# Patient Record
Sex: Female | Born: 1950
Health system: Southern US, Community
[De-identification: ages and names within clinical notes are randomized; demographics above are authoritative.]

## PROBLEM LIST (undated history)

## (undated) DIAGNOSIS — J189 Pneumonia, unspecified organism: Secondary | ICD-10-CM

## (undated) DIAGNOSIS — F32A Depression, unspecified: Secondary | ICD-10-CM

## (undated) DIAGNOSIS — I491 Atrial premature depolarization: Secondary | ICD-10-CM

## (undated) DIAGNOSIS — R413 Other amnesia: Secondary | ICD-10-CM

## (undated) DIAGNOSIS — Z8489 Family history of other specified conditions: Secondary | ICD-10-CM

## (undated) DIAGNOSIS — C801 Malignant (primary) neoplasm, unspecified: Secondary | ICD-10-CM

## (undated) DIAGNOSIS — K219 Gastro-esophageal reflux disease without esophagitis: Secondary | ICD-10-CM

## (undated) DIAGNOSIS — I499 Cardiac arrhythmia, unspecified: Secondary | ICD-10-CM

## (undated) DIAGNOSIS — K222 Esophageal obstruction: Secondary | ICD-10-CM

## (undated) DIAGNOSIS — M1990 Unspecified osteoarthritis, unspecified site: Secondary | ICD-10-CM

## (undated) DIAGNOSIS — K209 Esophagitis, unspecified without bleeding: Secondary | ICD-10-CM

## (undated) DIAGNOSIS — Z9109 Other allergy status, other than to drugs and biological substances: Secondary | ICD-10-CM

## (undated) DIAGNOSIS — S82892A Other fracture of left lower leg, initial encounter for closed fracture: Secondary | ICD-10-CM

## (undated) HISTORY — DX: Esophageal obstruction: K22.2

## (undated) HISTORY — PX: CORNEAL TRANSPLANT: SHX108

## (undated) HISTORY — DX: Esophagitis, unspecified: K20.9

## (undated) HISTORY — PX: KNEE ARTHROSCOPY W/ LATERAL RELEASE: SHX1873

## (undated) HISTORY — PX: EYE SURGERY: SHX253

## (undated) HISTORY — PX: INCONTINENCE SURGERY: SHX676

## (undated) HISTORY — DX: Depression, unspecified: F32.A

## (undated) HISTORY — DX: Esophagitis, unspecified without bleeding: K20.90

## (undated) HISTORY — PX: KNEE ARTHROSCOPY W/ MENISCAL REPAIR: SHX1877

## (undated) HISTORY — PX: COLONOSCOPY: SHX174

---

## 1996-12-31 HISTORY — PX: ABDOMINAL HYSTERECTOMY: SHX81

## 1998-12-31 HISTORY — PX: ANTERIOR FUSION CERVICAL SPINE: SUR626

## 2000-01-25 ENCOUNTER — Other Ambulatory Visit: Admission: RE | Admit: 2000-01-25 | Discharge: 2000-01-25 | Payer: Self-pay | Admitting: Obstetrics and Gynecology

## 2000-02-08 ENCOUNTER — Encounter: Payer: Self-pay | Admitting: Orthopedic Surgery

## 2000-02-08 ENCOUNTER — Ambulatory Visit (HOSPITAL_COMMUNITY): Admission: RE | Admit: 2000-02-08 | Discharge: 2000-02-08 | Payer: Self-pay | Admitting: Orthopedic Surgery

## 2000-03-29 ENCOUNTER — Encounter: Payer: Self-pay | Admitting: Neurological Surgery

## 2000-04-02 ENCOUNTER — Encounter: Payer: Self-pay | Admitting: Neurological Surgery

## 2000-04-02 ENCOUNTER — Inpatient Hospital Stay (HOSPITAL_COMMUNITY): Admission: RE | Admit: 2000-04-02 | Discharge: 2000-04-03 | Payer: Self-pay | Admitting: Neurological Surgery

## 2000-04-26 ENCOUNTER — Encounter: Payer: Self-pay | Admitting: Neurological Surgery

## 2000-04-26 ENCOUNTER — Encounter: Admission: RE | Admit: 2000-04-26 | Discharge: 2000-04-26 | Payer: Self-pay | Admitting: Neurological Surgery

## 2000-05-24 ENCOUNTER — Encounter: Payer: Self-pay | Admitting: Neurological Surgery

## 2000-05-24 ENCOUNTER — Encounter: Admission: RE | Admit: 2000-05-24 | Discharge: 2000-05-24 | Payer: Self-pay | Admitting: Neurological Surgery

## 2001-06-11 ENCOUNTER — Emergency Department (HOSPITAL_COMMUNITY): Admission: EM | Admit: 2001-06-11 | Discharge: 2001-06-11 | Payer: Self-pay | Admitting: Internal Medicine

## 2001-08-08 ENCOUNTER — Other Ambulatory Visit: Admission: RE | Admit: 2001-08-08 | Discharge: 2001-08-08 | Payer: Self-pay | Admitting: Obstetrics and Gynecology

## 2002-09-17 ENCOUNTER — Other Ambulatory Visit: Admission: RE | Admit: 2002-09-17 | Discharge: 2002-09-17 | Payer: Self-pay | Admitting: Obstetrics and Gynecology

## 2003-07-14 ENCOUNTER — Encounter: Payer: Self-pay | Admitting: *Deleted

## 2003-07-14 ENCOUNTER — Emergency Department (HOSPITAL_COMMUNITY): Admission: EM | Admit: 2003-07-14 | Discharge: 2003-07-14 | Payer: Self-pay | Admitting: Emergency Medicine

## 2003-08-04 ENCOUNTER — Other Ambulatory Visit: Admission: RE | Admit: 2003-08-04 | Discharge: 2003-08-04 | Payer: Self-pay | Admitting: Obstetrics and Gynecology

## 2004-01-01 DIAGNOSIS — S82892A Other fracture of left lower leg, initial encounter for closed fracture: Secondary | ICD-10-CM

## 2004-01-01 HISTORY — DX: Other fracture of left lower leg, initial encounter for closed fracture: S82.892A

## 2005-03-09 ENCOUNTER — Ambulatory Visit: Payer: Self-pay | Admitting: Internal Medicine

## 2005-06-22 ENCOUNTER — Ambulatory Visit: Payer: Self-pay | Admitting: Internal Medicine

## 2005-07-29 ENCOUNTER — Inpatient Hospital Stay (HOSPITAL_COMMUNITY): Admission: EM | Admit: 2005-07-29 | Discharge: 2005-07-30 | Payer: Self-pay | Admitting: Emergency Medicine

## 2005-11-05 ENCOUNTER — Ambulatory Visit: Payer: Self-pay | Admitting: Internal Medicine

## 2006-01-14 ENCOUNTER — Encounter (INDEPENDENT_AMBULATORY_CARE_PROVIDER_SITE_OTHER): Payer: Self-pay | Admitting: Specialist

## 2006-01-14 ENCOUNTER — Ambulatory Visit: Payer: Self-pay | Admitting: Internal Medicine

## 2006-02-18 ENCOUNTER — Other Ambulatory Visit: Admission: RE | Admit: 2006-02-18 | Discharge: 2006-02-18 | Payer: Self-pay | Admitting: Obstetrics and Gynecology

## 2006-11-11 ENCOUNTER — Encounter: Admission: RE | Admit: 2006-11-11 | Discharge: 2006-11-11 | Payer: Self-pay | Admitting: Neurological Surgery

## 2007-08-31 ENCOUNTER — Encounter: Admission: RE | Admit: 2007-08-31 | Discharge: 2007-08-31 | Payer: Self-pay | Admitting: Neurological Surgery

## 2007-09-18 ENCOUNTER — Encounter: Admission: RE | Admit: 2007-09-18 | Discharge: 2007-09-18 | Payer: Self-pay | Admitting: Neurological Surgery

## 2007-09-26 ENCOUNTER — Encounter: Admission: RE | Admit: 2007-09-26 | Discharge: 2007-09-26 | Payer: Self-pay | Admitting: Neurological Surgery

## 2007-10-29 ENCOUNTER — Ambulatory Visit: Payer: Self-pay | Admitting: Internal Medicine

## 2008-02-24 DIAGNOSIS — Z8719 Personal history of other diseases of the digestive system: Secondary | ICD-10-CM

## 2008-02-24 DIAGNOSIS — Z85828 Personal history of other malignant neoplasm of skin: Secondary | ICD-10-CM

## 2008-02-24 DIAGNOSIS — E8941 Symptomatic postprocedural ovarian failure: Secondary | ICD-10-CM

## 2008-03-15 ENCOUNTER — Encounter: Payer: Self-pay | Admitting: Internal Medicine

## 2008-04-25 ENCOUNTER — Encounter: Admission: RE | Admit: 2008-04-25 | Discharge: 2008-04-25 | Payer: Self-pay | Admitting: Neurological Surgery

## 2008-06-08 ENCOUNTER — Emergency Department (HOSPITAL_BASED_OUTPATIENT_CLINIC_OR_DEPARTMENT_OTHER): Admission: EM | Admit: 2008-06-08 | Discharge: 2008-06-08 | Payer: Self-pay | Admitting: Emergency Medicine

## 2008-11-23 ENCOUNTER — Encounter: Payer: Self-pay | Admitting: Internal Medicine

## 2009-01-07 ENCOUNTER — Telehealth: Payer: Self-pay | Admitting: Internal Medicine

## 2009-02-16 DIAGNOSIS — K222 Esophageal obstruction: Secondary | ICD-10-CM

## 2009-02-16 DIAGNOSIS — K219 Gastro-esophageal reflux disease without esophagitis: Secondary | ICD-10-CM

## 2009-02-18 ENCOUNTER — Ambulatory Visit: Payer: Self-pay | Admitting: Internal Medicine

## 2009-02-18 DIAGNOSIS — R195 Other fecal abnormalities: Secondary | ICD-10-CM | POA: Insufficient documentation

## 2009-02-21 LAB — CONVERTED CEMR LAB
Basophils Absolute: 0 10*3/uL (ref 0.0–0.1)
Basophils Relative: 0.1 % (ref 0.0–3.0)
Eosinophils Relative: 5.4 % — ABNORMAL HIGH (ref 0.0–5.0)
HCT: 39.9 % (ref 36.0–46.0)
Hemoglobin: 13.6 g/dL (ref 12.0–15.0)
Iron: 61 ug/dL (ref 42–145)
Lymphocytes Relative: 40.6 % (ref 12.0–46.0)
MCV: 92.4 fL (ref 78.0–100.0)
Monocytes Relative: 6.8 % (ref 3.0–12.0)
Neutrophils Relative %: 47.1 % (ref 43.0–77.0)
RBC: 4.32 M/uL (ref 3.87–5.11)
Saturation Ratios: 19.2 % — ABNORMAL LOW (ref 20.0–50.0)
Transferrin: 226.6 mg/dL (ref >=212.0)

## 2009-02-25 ENCOUNTER — Ambulatory Visit: Payer: Self-pay | Admitting: Internal Medicine

## 2009-02-28 LAB — CONVERTED CEMR LAB
OCCULT 1: NEGATIVE
OCCULT 3: NEGATIVE
OCCULT 4: NEGATIVE

## 2010-01-12 ENCOUNTER — Encounter: Payer: Self-pay | Admitting: Internal Medicine

## 2010-02-23 ENCOUNTER — Ambulatory Visit (HOSPITAL_COMMUNITY): Admission: RE | Admit: 2010-02-23 | Discharge: 2010-02-23 | Payer: Self-pay | Admitting: Obstetrics and Gynecology

## 2010-08-07 ENCOUNTER — Telehealth: Payer: Self-pay | Admitting: Internal Medicine

## 2010-09-20 ENCOUNTER — Ambulatory Visit: Payer: Self-pay | Admitting: Internal Medicine

## 2010-11-16 ENCOUNTER — Encounter: Payer: Self-pay | Admitting: Internal Medicine

## 2010-11-16 ENCOUNTER — Telehealth: Payer: Self-pay | Admitting: Internal Medicine

## 2010-12-14 ENCOUNTER — Encounter (INDEPENDENT_AMBULATORY_CARE_PROVIDER_SITE_OTHER): Payer: Self-pay | Admitting: *Deleted

## 2010-12-18 ENCOUNTER — Ambulatory Visit: Payer: Self-pay | Admitting: Internal Medicine

## 2011-01-03 ENCOUNTER — Encounter: Payer: Self-pay | Admitting: Internal Medicine

## 2011-01-03 ENCOUNTER — Ambulatory Visit
Admission: RE | Admit: 2011-01-03 | Discharge: 2011-01-03 | Payer: Self-pay | Source: Home / Self Care | Attending: Internal Medicine | Admitting: Internal Medicine

## 2011-01-04 ENCOUNTER — Encounter: Payer: Self-pay | Admitting: Internal Medicine

## 2011-01-30 NOTE — Miscellaneous (Signed)
Summary: Nexium Refill  Clinical Lists Changes  Medications: Changed medication from NEXIUM 40 MG CPDR (ESOMEPRAZOLE MAGNESIUM) Take 1 tablet by mouth once a day. to NEXIUM 40 MG CPDR (ESOMEPRAZOLE MAGNESIUM) Take 1 tablet by mouth once a day. - Signed Rx of NEXIUM 40 MG CPDR (ESOMEPRAZOLE MAGNESIUM) Take 1 tablet by mouth once a day.;  #30 x 5;  Signed;  Entered by: Hortense Ramal CMA (AAMA);  Authorized by: Hart Carwin MD;  Method used: Electronically to [facility] [address], [place], [place], [place]  [address], Ph: [phone] or [phone], Fax: [phone]    Prescriptions: NEXIUM 40 MG CPDR (ESOMEPRAZOLE MAGNESIUM) Take 1 tablet by mouth once a day.  #30 x 5   Entered by:   Hortense Ramal CMA (AAMA)   Authorized by:   Hart Carwin MD   Signed by:   Hortense Ramal CMA (AAMA) on 01/12/2010   Method used:   Electronically to        [facility] [address] (retail)       [address]       [place], [place]  [address]       Ph: [phone] or [phone]       Fax: [phone]   RxID:   [id number]

## 2011-01-30 NOTE — Letter (Signed)
Summary: Pre Visit Letter Revised  [facility]  [address] [place], [place] [address]   Phone: [phone]  Fax: [phone]        11/16/2010 MRN: [id number] [name] Stone [address] [place], [place]  [address]             Procedure Date:  01/03/11   Welcome to the Gastroenterology Division at [facility].    You are scheduled to see a nurse for your pre-procedure visit on Monday 12/18/10 at 10:30 am on the 3rd floor at [facility], [address]  We ask that you try to arrive at our office 15 minutes prior to your appointment time to allow for check-in.  Please take a minute to review the attached form.  If you answer "Yes" to one or more of the questions on the first page, we ask that you call the person listed at your earliest opportunity.  If you answer "No" to all of the questions, please complete the rest of the form and bring it to your appointment.    Your nurse visit will consist of discussing your medical and surgical history, your immediate family medical history, and your medications.   If you are unable to list all of your medications on the form, please bring the medication bottles to your appointment and we will list them.  We will need to be aware of both prescribed and over the counter drugs.  We will need to know exact dosage information as well.    Please be prepared to read and sign documents such as consent forms, a financial agreement, and acknowledgement forms.  If necessary, and with your consent, a friend or relative is welcome to sit-in on the nurse visit with you.  Please bring your insurance card so that we may make a copy of it.  If your insurance requires a referral to see a specialist, please bring your referral form from your primary care physician.  No co-pay is required for this nurse visit.     If you cannot keep your appointment, please call [phone] to cancel or reschedule prior to your appointment date.  This  allows [place] the opportunity to schedule an appointment for another patient in need of care.    Thank you for choosing [facility] for your medical needs.  We appreciate the opportunity to care for you.  Please visit [place] at our website  to learn more about our practice.  Sincerely, The Gastroenterology Division

## 2011-01-30 NOTE — Assessment & Plan Note (Signed)
Summary: refill on meds--ch.    History of Present Illness Visit Type: Follow-up Visit Primary GI MD: [name] MD Primary Provider: [facility] Requesting Provider: n/a Chief Complaint: Pt needs refills on Nexium and to check to see when her next colonoscopy is due History of Present Illness:   This is a 60 year old white female with chronic gastroesophageal reflux and a history of esophageal stricture for which she is status post dilatation in January 2007. She is asymptomatic on Nexium 40 mg a day. Her insurance has been hesitant to pay for her prescription. They allowed her only 2 refills over next 6 months. She has a new family history of colon polyps. Her bowel habits have been regular. The last colonoscopy in June 2006 was normal. 3 Hemoccult cards in February 2010 were negative. She is interested in having her colonoscopy done earlier because of the new family history of colon polyps and change in her bowel habits.   GI Review of Systems      Denies abdominal pain, acid reflux, belching, bloating, chest pain, dysphagia with liquids, dysphagia with solids, heartburn, loss of appetite, nausea, vomiting, vomiting blood, weight loss, and  weight gain.        Denies anal fissure, black tarry stools, change in bowel habit, constipation, diarrhea, diverticulosis, fecal incontinence, heme positive stool, hemorrhoids, irritable bowel syndrome, jaundice, light color stool, liver problems, rectal bleeding, and  rectal pain.    Current Medications (verified): 1)  Nexium 40 Mg Cpdr (Esomeprazole Magnesium) .... Take 1 Tablet By Mouth Once A Day. 2)  Estrace 1 Mg Tabs (Estradiol) .... One Tablet By Mouth Once Daily 3)  Bentyl 10 Mg Caps (Dicyclomine Hcl) .... Take 1 Tablet By Mouth Two Times A Day As Needed For Crampy Abdominal Pain 4)  Cytomel 25 Mcg Tabs (Liothyronine Sodium) .Marland Kitchen.. 1 By Mouth Once Daily  Allergies (verified): 1)  ! Demerol  Past History:  Past  Medical History: Last updated: 02/16/2009 Current Problems:  Hx of ESOPHAGEAL STRICTURE (ICD-530.3) GERD (ICD-530.81) CONSTIPATION, HX OF (ICD-V12.79) SKIN CANCER, HX OF (ICD-V10.83) MENOPAUSE, SURGICAL (ICD-627.4) ESOPHAGITIS, HX OF (ICD-V12.79)  Past Surgical History: Hysterectomy Spinal fusion C3,4,5 Arthroscopic surgery on both knees Eye surgery bladder sling 2011  Family History: Reviewed history from 02/18/2009 and no changes required. No FH of Colon Cancer: Skin cancer: Father, Grandafther, Brother Family History of Colon Polyps: Brother Family History of Colitis/Crohn's: Mother  Social History: Reviewed history from 02/18/2009 and no changes required. Alcohol Use - no Illicit Drug Use - no Patient has never smoked.  Daily Caffeine Use  Review of Systems       Pertinent positive and negative review of systems were noted in the above HPI. All other ROS was otherwise negative.   Vital Signs:  Patient profile:   60 year old female Height:      66 inches Weight:      150 pounds BMI:     24.30 BSA:     1.77 Pulse rate:   80 / minute BP sitting:   90 / 60  (left arm)  Vitals Entered By: Merri Ray CMA Duncan Dull) (September 20, 2010 8:59 AM)   Impression & Recommendations:  Problem # 1:  Hx of ESOPHAGEAL STRICTURE (ICD-530.3) Patient is asymptomatic on Nexium 40 mg daily. We will attempt prior authorization so she can have refills for one year. If not, we will switch her to Prilosec 40 mg daily.  Problem # 2:  CONSTIPATION, HX OF (ICD-V12.79) Patient has  had a change in bowel habits. We will plan on doing a colonoscopy before the end of the year. She has a new family history of colon polyps.  Patient Instructions: 1)  high-fiber diet. 2)  Nexium 40 mg daily. We will try to get prior authorization. If this is unsuccessful, we will try her on Prilosec 40 mg once daily.  3)  Antireflux measures. 4)  Colonoscopy in December 2011. 5)  The medication list was  reviewed and reconciled.  All changed / newly prescribed medications were explained.  A complete medication list was provided to the patient / caregiver. Prescriptions: NEXIUM 40 MG CPDR (ESOMEPRAZOLE MAGNESIUM) Take 1 tablet by mouth once a day.  #30 x 3   Entered by:   Lamona Curl CMA (AAMA)   Authorized by:   Hart Carwin MD   Signed by:   Lamona Curl CMA (AAMA) on 09/20/2010   Method used:   Electronically to        [facility] [address] (retail)       [address]       [place], [place]  [address]       Ph: [phone] or [phone]       Fax: [phone]   RxID:   [id number]

## 2011-01-30 NOTE — Progress Notes (Signed)
Summary: Medication   Phone Note Call from Patient Call back at Home Phone [phone]   Caller: Patient Call For: Dr. Juanda Chance Reason for Call: Refill Medication Summary of Call: Needs refill on her Nexium...sch'd appt for 09-20-10.Marland KitchenMarland KitchenMarland KitchenSharl [facility] Initial call taken by: Karna Christmas,  August 07, 2010 9:58 AM  Follow-up for Phone Call        Prescription sent. Follow-up by: Lamona Curl CMA (AAMA),  August 07, 2010 10:20 AM    Prescriptions: NEXIUM 40 MG CPDR (ESOMEPRAZOLE MAGNESIUM) Take 1 tablet by mouth once a day.  #30 x 1   Entered by:   Lamona Curl CMA (AAMA)   Authorized by:   Hart Carwin MD   Signed by:   Lamona Curl CMA (AAMA) on 08/07/2010   Method used:   Electronically to        [facility] [address] (retail)       [address]       [place], [place]  [address]       Ph: [phone] or [phone]       Fax: [phone]   RxID:   [id number]

## 2011-01-30 NOTE — Progress Notes (Signed)
Summary: procedure?   Phone Note Call from Patient Call back at [phone]   Caller: Patient Call For: Dr. Juanda Chance Reason for Call: Talk to Nurse Summary of Call: thinks she is supposed to speak with Dottie regarding a procedure but isnt sure Initial call taken by: Vallarie Mare,  November 16, 2010 1:15 PM  Follow-up for Phone Call        Patient calling to set up colonoscopy (as recommended per Dr Juanda Chance at patients last office visit). Patient has been set up for previsit and colonoscopy. Follow-up by: Lamona Curl CMA Duncan Dull),  November 16, 2010 3:21 PM

## 2011-02-01 NOTE — Letter (Signed)
Summary: Patient Notice- Colon Biospy Results  [facility]  [address] [place], [place] [address]   Phone: [phone]  Fax: [phone]        January 04, 2011 MRN: [id number]    [name] Stone [address] [place], [place]  [address]    Dear Ms. Verlon Setting,  I am pleased to inform you that the biopsies taken during your recent colonoscopy did not show any evidence of cancer upon pathologic examination.The polyp consisted of normal colon tissue, no precancerous tissue.  Additional information/recommendations:  _x_No further action is needed at this time.  Please follow-up with      your primary care physician for your other healthcare needs.  __Please call [phone] to schedule a return visit to review      your condition.  __Continue with the treatment plan as outlined on the day of your      exam.  x__You should have a repeat colonoscopy examination for this problem           in 10 _ years.  Please call us if you are having persistent problems or have questions about your condition that have not been fully answered at this time.  Sincerely,  Hart Carwin MD   This letter has been electronically signed by your physician.  Appended Document: Patient Notice- Colon Biospy Results Letter mailed

## 2011-02-01 NOTE — Miscellaneous (Signed)
Summary: [facility] PV  Clinical Lists Changes  Medications: Added new medication of MIRALAX   POWD (POLYETHYLENE GLYCOL 3350) As per prep  instructions. - Signed Added new medication of DULCOLAX 5 MG  TBEC (BISACODYL) Day before procedure take 2 at 3pm and 2 at 8pm. - Signed Added new medication of REGLAN 10 MG  TABS (METOCLOPRAMIDE HCL) As per prep instructions. - Signed Rx of MIRALAX   POWD (POLYETHYLENE GLYCOL 3350) As per prep  instructions.;  #255gm x 0;  Signed;  Entered by: Ezra Sites RN;  Authorized by: Hart Carwin MD;  Method used: Electronically to [facility] [address], [place], [place], [place]  [address], Ph: [phone] or [phone], Fax: [phone] Rx of DULCOLAX 5 MG  TBEC (BISACODYL) Day before procedure take 2 at 3pm and 2 at 8pm.;  #4 x 0;  Signed;  Entered by: Ezra Sites RN;  Authorized by: Hart Carwin MD;  Method used: Electronically to [facility] [address], [place], [place], [place]  [address], Ph: [phone] or [phone], Fax: [phone] Rx of REGLAN 10 MG  TABS (METOCLOPRAMIDE HCL) As per prep instructions.;  #2 x 0;  Signed;  Entered by: Ezra Sites RN;  Authorized by: Hart Carwin MD;  Method used: Electronically to [facility] [address] [place], [place], [place]  [address], Ph: [phone] or [phone], Fax: [phone] Allergies: Changed allergy or adverse reaction from DEMEROL to DEMEROL Added new allergy or adverse reaction of ASA    Prescriptions: REGLAN 10 MG  TABS (METOCLOPRAMIDE HCL) As per prep instructions.  #2 x 0   Entered by:   Ezra Sites RN   Authorized by:   Hart Carwin MD   Signed by:   Ezra Sites RN on 12/18/2010   Method used:   Electronically to        [facility] [address] (retail)       [address]       [place], [place]  [address]       Ph: [phone] or [phone]       Fax: [phone]   RxID:    [id number] DULCOLAX 5 MG  TBEC (BISACODYL) Day before procedure take 2 at 3pm and 2 at 8pm.  #4 x 0   Entered by:   Ezra Sites RN   Authorized by:   Hart Carwin MD   Signed by:   Ezra Sites RN on 12/18/2010   Method used:   Electronically to        [facility] [address] (retail)       [address]       [place], [place]  [address]       Ph: [phone] or [phone]       Fax: [phone]   RxID:   [id number] MIRALAX   POWD (POLYETHYLENE GLYCOL 3350) As per prep  instructions.  #255gm x 0   Entered by:   Ezra Sites RN   Authorized by:   Hart Carwin MD   Signed by:   Ezra Sites RN on 12/18/2010   Method used:   Electronically to        [facility] [address] (retail)       [address]       [address]       [place], [place]  [address]       Ph: [phone] or [phone]       Fax: [phone]   RxID:   [id number]

## 2011-02-01 NOTE — Letter (Signed)
Summary: Miralax Instructions  [facility]  [address]   [place], [place] [address]   Phone: [phone]  Fax: [phone]       BELITA WARSAME    01-01-1951    MRN: [id number]       Procedure Day /Date: Wednessday, 01-03-11     Arrival Time: 10:30 a.m.     Procedure Time: 11:30 a.m.     Location of Procedure:                    _x  [facility] (4th Floor)  PREPARATION FOR COLONOSCOPY WITH MIRALAX  Starting 5 days prior to your procedure 12-29-10  do not eat nuts, seeds, popcorn, corn, beans, peas,  salads, or any raw vegetables.  Do not take any fiber supplements (e.g. Metamucil, Citrucel, and Benefiber). ____________________________________________________________________________________________________   THE DAY BEFORE YOUR PROCEDURE         DATE: 01-02-11  DAY: Tuesday  1   Drink clear liquids the entire day-NO SOLID FOOD  2   Do not drink anything colored red or purple.  Avoid juices with pulp.  No orange juice.  3   Drink at least 64 oz. (8 glasses) of fluid/clear liquids during the day to prevent dehydration and help the prep work efficiently.  CLEAR LIQUIDS INCLUDE: Water Jello Ice Popsicles Tea (sugar ok, no milk/cream) Powdered fruit flavored drinks Coffee (sugar ok, no milk/cream) Gatorade Juice: apple, white grape, white cranberry  Lemonade Clear bullion, consomm, broth Carbonated beverages (any kind) Strained chicken noodle soup Hard Candy  4   Mix the entire bottle of Miralax with 64 oz. of Gatorade/Powerade in the morning and put in the refrigerator to chill.  5   At 3:00 pm take 2 Dulcolax/Bisacodyl tablets.  6   At 4:30 pm take one Reglan/Metoclopramide tablet.  7  Starting at 5:00 pm drink one 8 oz glass of the Miralax mixture every 15-20 minutes until you have finished drinking the entire 64 oz.  You should finish drinking prep around 7:30 or 8:00 pm.  8   If you are nauseated, you may take the 2nd  Reglan/Metoclopramide tablet at 6:30 pm.        9    At 8:00 pm take 2 more DULCOLAX/Bisacodyl tablets.     THE DAY OF YOUR PROCEDURE      DATE:  01-03-11   DAY: Wednesday  You may drink clear liquids until 9:30 a.m.  (2 HOURS BEFORE PROCEDURE).   MEDICATION INSTRUCTIONS  Unless otherwise instructed, you should take regular prescription medications with a small sip of water as early as possible the morning of your procedure.           OTHER INSTRUCTIONS  You will need a responsible adult at least 58 years of age to accompany you and drive you home.   This person must remain in the waiting room during your procedure.  Wear loose fitting clothing that is easily removed.  Leave jewelry and other valuables at home.  However, you may wish to bring a book to read or an iPod/MP3 player to listen to music as you wait for your procedure to start.  Remove all body piercing jewelry and leave at home.  Total time from sign-in until discharge is approximately 2-3 hours.  You should go home directly after your procedure and rest.  You can resume normal activities the day after your procedure.  The day of your procedure you should not:   Drive  Make legal decisions   Operate machinery   Drink alcohol   Return to work  You will receive specific instructions about eating, activities and medications before you leave.   The above instructions have been reviewed and explained to me by   Ezra Sites RN  December 18, 2010 11:14 AM     I fully understand and can verbalize these instructions _____________________________ Date _______

## 2011-02-01 NOTE — Procedures (Signed)
Summary: Colonoscopy  Patient: [name] Stone Note: All result statuses are Final unless otherwise noted.  Tests: (1) Colonoscopy (COL)   COL Colonoscopy           DONE     [facility]     [address]     [place], [place]  [address]           COLONOSCOPY PROCEDURE REPORT           PATIENT:  [name] Stone, [name] Stone  MR#:  [id number]     BIRTHDATE:  August 09, 1951, 59 yrs. old  GENDER:  female     ENDOSCOPIST:  Hedwig Morton. Juanda Chance, MD     REF. BY:  Doran Clay, M.D.     PROCEDURE DATE:  01/03/2011     PROCEDURE:  Colonoscopy 08657     ASA CLASS:  Class I     INDICATIONS:  family Hx of polyps last colon 2006,     change in bowl habits     MEDICATIONS:   Versed 10 mg, Fentanyl 100 mcg           DESCRIPTION OF PROCEDURE:   After the risks benefits and     alternatives of the procedure were thoroughly explained, informed     consent was obtained.  Digital rectal exam was performed and     revealed no rectal masses.   The LB [id number] endoscope     was introduced through the anus and advanced to the cecum, which     was identified by both the appendix and ileocecal valve, without     limitations.  The quality of the prep was good, using MiraLax.     The instrument was then slowly withdrawn as the colon was fully     examined.     <<PROCEDUREIMAGES>>           FINDINGS:  A diminutive polyp was found in the sigmoid colon. at     15 cm 2 mm polyp The polyp was removed using cold biopsy forceps     (see image3 and image4).  This was otherwise a normal examination     of the colon (see image5, image2, and image1).   Retroflexed views     in the rectum revealed no abnormalities.    The scope was then     withdrawn from the patient and the procedure completed.           COMPLICATIONS:  None     ENDOSCOPIC IMPRESSION:     1) Diminutive polyp in the sigmoid colon     2) Otherwise normal examination     RECOMMENDATIONS:     1) Await pathology results     REPEAT EXAM:  In 7 year(s)  for.           ______________________________     Hedwig Morton. Juanda Chance, MD           CC:           n.     eSIGNED:   Hedwig Morton. Rillie Riffel at 01/03/2011 12:35 PM           Ruchy, [place], [id number]  Note: An exclamation mark (!) indicates a result that was not dispersed into the flowsheet. Document Creation Date: 01/03/2011 12:35 PM _______________________________________________________________________  (1) Order result status: Final Collection or observation date-time: 01/03/2011 12:29 Requested date-time:  Receipt date-time:  Reported date-time:  Referring Physician:   Ordering Physician: [name] [phone]) Specimen Source:  Source: Kem Parkinson  Filler Order Number: [id number] Lab site:   Appended Document: Colonoscopy     Procedures Next Due Date:    Colonoscopy: 12/2017  Appended Document: Colonoscopy     Procedures Next Due Date:    Colonoscopy: 12/2020

## 2011-03-22 LAB — URINALYSIS, ROUTINE W REFLEX MICROSCOPIC
Ketones, ur: NEGATIVE mg/dL
Specific Gravity, Urine: <1.005 — ABNORMAL LOW (ref 1.005–1.030)

## 2011-03-22 LAB — CBC
HCT: 42.9 % (ref 36.0–46.0)
Hemoglobin: 14.2 g/dL (ref 12.0–15.0)
MCV: 93.2 fL (ref 78.0–100.0)
RBC: 4.61 MIL/uL (ref 3.87–5.11)

## 2011-05-15 NOTE — Assessment & Plan Note (Signed)
[facility]                         GASTROENTEROLOGY OFFICE NOTE   NAME:[name], [name] ARIZMENDI                       MRN:          [id number]  DATE:10/29/2007                            DOB:          28-Oct-1951    [name] [name] is a 60 year old white female who has significant  gastroesophageal reflux disease and has been dependent on Nexium 40 mg  daily.  She has switched to another medication and started taking over-  the-counter Prilosec.  This gave only partial control of her reflux  symptoms.  She had 1 episode recently of food regurgitation at night.  On last upper endoscopy in January of 2007, she was found to have a mild  nonobstructing esophageal stricture.  She did not have Barrett's  esophagus.  Her H pylori test was negative.  She also had a screening  colonoscopy in June of 2006 which was essentially normal.  No polyps.  Her repeat colonoscopy would be in 10 years, that it is in June of 2016.   MEDICATIONS:  1. Estradiol daily.  2. Nexium 40 mg p.o. daily.  3. Aspirin 81 mg p.o. daily.   PHYSICAL EXAMINATION:  VITAL SIGNS:  Blood pressure 120/80, pulse 82,  weight 163 pounds.   The patient was not examined.   IMPRESSION:  68. 60 year old white female with gastroesophageal reflux disease      documented on upper endoscopy.  Biopsies of the esophagus showed      mild inflammation, no intestinal metaplasia.  She is dependent on      proton pump inhibitors.  2. Colorectal screening completed in 2006.   PLAN:  1. Refill for Nexium for 1 year.  The patient may cut back on her      Nexium to every other day and to titrate the dose again for      symptoms.  2. Next colonoscopy recall in 2016.     Hedwig Morton. Juanda Chance, MD  Electronically Signed    DMB/MedQ  DD: 10/29/2007  DT: 10/30/2007  Job #: [id number]   cc:   Al Decant. Janey Greaser, MD

## 2011-05-18 NOTE — H&P (Signed)
NAMEMarland Kitchen  [name], Stone NO.:  [id number]   MEDICAL RECORD NO.:  [id number]          PATIENT TYPE:  EMS   LOCATION:  MAJO                         FACILITY:  [facility]   PHYSICIAN:  Peter M. Swaziland, M.D.  DATE OF BIRTH:  1951/09/04   DATE OF ADMISSION:  07/29/2005  DATE OF DISCHARGE:                                HISTORY & PHYSICAL   HISTORY OF PRESENT ILLNESS:  Ms.  [name] Stone is a 60 year old white female  previous healthy, appearing well in church this morning.  She complains of  severe chest pain in the left precordial region.  She grades this as 8 to  9/10.  It was not radiating but was associated with some shortness of  breath.  Her pain seemed to be worse when she took a deep breath.  She  noticed her hands sweating.  The intense pain lasted 15 minutes and then  seemed to abate.  She felt extremely wiped out afterwards.  Of note, the  patient had a cardiac evaluation two years ago including stress Cardiolite  study and echocardiogram which were unremarkable.  She does report she has  had recent colonoscopy but has never had upper endoscopy or evaluation for  gallstones.   PAST MEDICAL HISTORY:  1.  Status post cervical laminectomy.  2.  Status post hysterectomy.  3.  Status post bilateral knee surgery.   The patient has no history of hypertension, hyperlipidemia, or diabetes.   MEDICATIONS:  Estradiol daily.   ALLERGIES:  DEMEROL.   SOCIAL HISTORY:  The patient is a principal at an elementary school.  She  denies tobacco or alcohol use.   FAMILY HISTORY:  Negative for coronary disease.   PHYSICAL EXAMINATION:  GENERAL:  The patient is a pleasant white female in  no apparent distress.  VITAL SIGNS:  Blood pressure 134/78, pulse 67 and regular, respirations 20.  She is afebrile.  HEENT:  Pupils equal, round, and reactive to light and accommodation.  Sclerae clear.  Oropharynx clear.  NECK:  Without JVD, adenopathy, thyromegaly, or bruits.  LUNGS: Clear.  CARDIAC:  Regular rate and rhythm.  Normal S1 and S2 without gallop, murmur,  rub, or click.  ABDOMEN:  Soft and nontender.  She has no hepatosplenomegaly, masses, or  bruits.  EXTREMITIES:  Femoral and pedal pulses are 2+ and symmetric.  NEUROLOGIC:  Exam is intact.   LABORATORY DATA:  ECG is normal.   Chest x-ray shows no active disease.   Initial CPK-MB and troponin are normal.   IMPRESSION:  1.  Atypical chest pain but severe visceral pain, now resolved.  2.  History of cervical laminectomy.  3.  Status post hysterectomy.   PLAN:  1.  Will admit for observation on telemetry.  2.  Will obtain serial cardiac enzymes and electrocardiogram.  3.  Will start her on PPI.  4.  If her pain resolves and her laboratory evaluation is unremarkable, I      think she can probably be discharged for further outpatient evaluation      but will defer to Dr. Donnie Aho.  PMJ/MEDQ  D:  07/29/2005  T:  07/29/2005  Job:  [id number]   cc:   Darden Palmer., M.D.  [address]  [place]  [place] [address]  Fax: [phone]

## 2011-05-18 NOTE — Discharge Summary (Signed)
[place]. [facility]  Patient:    [name] Stone, [name] Stone Visit Number: [id number] MRN: [id number]          Service Type: SUR Location: [id number] Attending Physician:  Jonne Ply Admit Date:  04/02/2000 Discharge Date: 04/03/2000                             Discharge Summary  ADMISSION DIAGNOSIS:  Cervical spondylosis with radiculopathy C3-4 and C4-5.  DISCHARGE DIAGNOSIS:  Cervical spondylosis with radiculopathy C3-4 and C4-5.  OPERATION:  Anterior cervical diskectomy and arthrodesis C3-4 and C4-5 on April 02, 2000.  CONDITION ON DISCHARGE:  Improving.  HOSPITAL COURSE:  The patient is a 60 year old individual who has had neck, shoulder, and arm pain with some complaints of headache.  She was found to have significant spondylitic disease at C3-4 and C4-5.  She was taken to the operating room to undergo an anterior diskectomy and arthrodesis, which she tolerated well.  Postoperatively, she has normal neurologic function.  She has some neck soreness and difficulty swallowing but this is managed with oral medications.  DISCHARGE MEDICATIONS:  She is discharged home with a prescription for Xanax 0.5 mg #40 without refills.  She has prescriptions of Tylox and Vicodin at home for pain. Attending Physician:  Jonne Ply DD:  04/03/00 TD:  04/05/00 Job: [id number]

## 2011-05-18 NOTE — Op Note (Signed)
[place]. [facility]  Patient:    [name] Stone, [name] Stone                       MRN: [id number] Proc. Date: 04/02/00 Adm. Date:  [id number] Attending:  Jonne Ply                           Operative Report  PREOPERATIVE DIAGNOSIS:  Cervical spondylosis with right cervical radiculopathy  C3-4 and C4-5.  POSTOPERATIVE DIAGNOSIS:  Cervical spondylosis with right cervical radiculopathy C3-4 and C4-5.  OPERATION:  Anterior cervical diskectomy C3-4 and C4-5.  Arthrodesis with allograft Synthes fixation.  SURGEON:  Stefani Dama, M.D.  ANESTHESIA:  General endotracheal.  INDICATIONS:  The patient is a 60 year old individual who has had significant neck, shoulder, and arm pain off to the right side.  All of the pain has been very proximal.  She has spondylitic disease at C3-4 and C4-5.  PROCEDURE:  The patient was brought to the operating room and placed on the table in the supine position after smooth induction of general endotracheal anesthesia. She was placed in 5 pounds of Holter traction and the neck was prepped with Duraprep and draped in a sterile fashion.  A transverse incision was made on the left side of the neck high near the area of C3-4 and C4-5.  Dissection was carried down through the platysmus and the plane between the sternocleidomastoid and the strap muscles was dissected bluntly until the prevertebral space was reached. First identifiable disk space was noted to be that of C4-5.  Dissection was then carried up cephalad.  The ______ muscle was stripped from either side and a self-retaining cats paw retractor was placed in the wound.  Diskectomy at C3-4 as encountered first but a marked amount of markedly degenerated disk material was  removed from within the disk space.  Some bony osteophytes ventrally were removed with a Kerrison punch.  Midas-Rex drill was used to remove lateral uncinating processes, which were particularly  hypertrophied on the right side and modestly  hypertrophied on the left side.  Once this area was decompressed and hemostasis was obtained, a 7 mm round fibular graft was placed into the interspace.  Attention was then turned to C4-5, where similar procedures carried out.  The disk was not nearly as degenerated here.  The uncinated process spurs were smaller.  These were drilled down with a Midas-Rex and ______ burr and removed.  The bone graft here was a 6 mm round fibular graft that was packed with some of the autograft that was available from removal of the osteophytes.  This was placed into the interspace. Traction was removed and the neck was placed in ______ flexion.  A 37 mm Synthes plate was then affixed with six self-drilling, self-tapping screws measuring 4 x 14 mm in  length.  Localizing radiograph identified good position of the screws.  The area was then copiously irrigated with antibiotic irrigating solution.  The platysmus was then closed with 2 0 Vicryl interrupted fashion and 3 0 Vicryl was used subcuticularly.  Dermabond was used on the skin.  The patient tolerated the procedure well. DD:  04/02/00 TD:  04/02/00 Job: [id number]

## 2011-05-18 NOTE — Consult Note (Signed)
NAME:  [name] Stone, [name] Stone                          ACCOUNT NO.:  [id number]   MEDICAL RECORD NO.:  [id number]                   PATIENT TYPE:  EMS   LOCATION:  MINO                                 FACILITY:  [facility]   PHYSICIAN:  W. Ashley Royalty., M.D.         DATE OF BIRTH:  20-Jun-1951   DATE OF CONSULTATION:  07/14/2003  DATE OF DISCHARGE:                                   CONSULTATION   HISTORY OF PRESENT ILLNESS:  A 60 year old female who has a history of  cervical disk disease and lumbar disk disease, but no prior history. She has  had some atypical chest pain over the years and had a negative stress test  just last year.  She does not have any appreciable cardiac risk factors.  She developed a severe headache late yesterday afternoon that persisted and  she went to bed last evening. She awoke at 5 o'clock this morning feeling  poorly with a severe headache, weakness in her arms, a general feeling of  malaise, and tiredness.  She took two ibuprofen.  Around 7 p.m. while  standing at a sink, she had the onset of left-sided chest discomfort which  was localized and described as a heaviness or severe indigestion.  It did  not radiate and had no other associated symptoms, nausea, diaphoresis, or  shortness of breath.  The symptoms went away after about 20 minutes.  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  This lasted around 30 minutes. She  felt weak, dizzy, continued to have severe headache and presented to the  [facility] emergency room.  She had a normal EKG on admission and her pain  resolved spontaneously. It recurrent briefly at a low grade level, but has  been pain-free for some time now.  She denies any recent exertional chest  pain and had been able to do her activities normally.  Three sets of cardiac  markers are all negative,  each done at one hour and EKG is normal.   PAST MEDICAL HISTORY:  Negative for hypertension or diabetes.   PAST SURGICAL HISTORY:  Has had cervical laminectomy previously by Stefani Dama, M.D.   ALLERGIES:  No known drug allergies.   FAMILY HISTORY:  Negative for premature cardiac disease in her immediate  family. Grandparents may have had some heart disease.   SOCIAL HISTORY:  She is single. 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 Does not smoke or use alcohol to  excess.  Attends [employer].   REVIEW OF SYSTEMS:  She has headaches which are menstrually related. She  otherwise feels well.  She has had some indigestion and dyspepsia previously  which resolved with treatment with Prilosec or Protonix.  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  She does have intermittent headaches.  Other than as noted above, the remainder of the review of systems is  unremarkable.   PHYSICAL EXAMINATION:  GENERAL: She is a pleasant female appearing stated  age.  VITAL SIGNS:  Blood pressure 130/70 and pulse is 70.  SKIN:  Warm and dry.  HEENT:  EOMI, PERRLA, CNS unremarkable. Pharynx negative.  NECK:  Supple without masses, JVD, thyromegaly, or bruits.  LUNGS:  Clear to A&P.  HEART:  Normal S1 and S2, no S3.  ABDOMEN:  Soft and nontender.  No mass, hepatosplenomegaly, or aneurysm.  Peripheral pulses 2+.   EKG is normal.  Laboratory markers are all negative for cardiac ischemia.   IMPRESSION:  1. Atypical chest pain with an early rule out for myocardial infarction and     normal EKG.  This possibly could be related to the headache or nonsteroid     anti-inflammatory agents that she took.  2. History of cervical disk disease.  3. History of headache which is severe and ongoing at the present time.   RECOMMENDATIONS:  Treat with GI cocktail and acid inhibitors.   Treat her  headache with Darvocet.  If EKG is negative, may have early discharge with  outpatient follow-up. She will need to be discharged also on proton pump  inhibitors.                                               Darden Palmer., M.D.    WST/MEDQ  D:  07/14/2003  T:  07/14/2003  Job:  [id number]   cc:   Al Decant. Janey Greaser, M.D.  [address]  [place]  [place] [address]  Fax: [phone]

## 2011-05-18 NOTE — H&P (Signed)
[place]. [facility]  Patient:    [name] Stone, [name] Stone                       MRN: [id number] Adm. Date:  [id number] Attending:  Jonne Ply                         History and Physical  ADMISSION DIAGNOSIS:  Cervical spondylosis and stenosis with cervical radiculopathy C3-4 and C4-5 right.  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 She has had pain in the region of the shoulder and the neck that started intermittently during the past summer. Pain would initially start as a kind of hurt and ache. It was poorly described and occurred from region of her neck to the lateral aspect of her right shoulder. In August of this past year, she developed a sensation of electrical shocks in this region; a sensation that she now calls the "claw". This would occur intermittently, but it was a grasping sensation in the muscular region of the right shoulder. It would never radiate into the arm or hand, and she had no sensation of numbness or tingling. She had a fracture of her T7 vertebra about two years ago that caused considerable amount of back pain. This, however, she notes was different type of pain. She was concerned that her current neck problem may be a complication of her T7 fracture. She was worked up with an MRI of the cervical spine which showed that she had significant spondylitic disease at the C3-4 and C4-5 level. There is modest biforaminal stenosis at C4-5, significant right-sided foraminal stenosis at C3-4. Complicating this, she notes that she has had some element of headaches. She has been through a considerable effort at conservative management, including physical therapy which would help for brief periods of time; a number of medications, including nonsteroidal inflammatories have also been tried with little significant chronic relief. She was advised regarding surgery as an  outpatient on March 23 when seen in the office. She wishes to proceed with surgery as she has had no relief of the pain despite conservative effort.  PAST MEDICAL HISTORY:  The patient has generally had good health. She notes no significant medical problems and takes no medication on a chronic basis.  CURRENT MEDICATIONS:  None.  ALLERGIES:  DEMEROL.  SOCIAL HISTORY:  She does not smoke. She does not drink alcohol. Her height and weight have been stable at 175 pounds, 5 feet 6 inches.  FAMILY HISTORY:  Reveals that her mother is age 58 in good health. Father is age 35 also in good health.  REVIEW OF SYSTEMS:  Notable for wearing of glasses, arm weakness, neck pain. Sheet was reviewed and discussed with the patient.  PHYSICAL EXAMINATION:  GENERAL:  She is an alert, oriented, cooperative individual in no overt distress.  MOTOR:  Motor strength in the upper extremities reveal the deltoids, biceps, triceps, grips, and intrinsics have normal strength, tone, and bulk to confrontation. Deep tendon reflexes are 2+ in the biceps, 2+ in the triceps, 1+ in the brachioradialis, 2+ in the patellae, and 2+ in the Achilles. Station and gait are normal.  NEUROLOGICAL:  Cranial nerves examination reveals the pupils are 3 mm, briskly reactive to light and accommodation. The extraocular movements are full. The face is symmetric to grimace. Tongue and uvula are in the midline. Sclerae and conjunctivae  are clear. The station and gait are normal.  NECK:  Her range of motion in her neck reveals that she turns to the right 60 degrees, turns to the left 60 degrees. She extends and flexes normally. Axial compression reproduces pain on turning to the right only. She has a positive Spurlings maneuver on turning to the right.  LUNGS:  Clear to auscultation.  HEART:  Regular rate and rhythm. No murmurs are heard.  ABDOMEN:  Soft, protuberant. Bowel sounds are positive. No masses  are palpable.  EXTREMITIES:  No clubbing, cyanosis, or edema.  IMPRESSION:  The patient has evidence of spondylitic disease in the neck at C3-4 and C4-5. She has been advised regarding surgical decompression which initially I suggested only should consider the C3-4 level. However, on further consideration, I indicated that the degree of the disease that she has at C4-5 and after discussion with the patient we are proceeding with two-level anterior diskectomy and arthrodesis using a Synthes fixation allograft. DD:  04/02/00 TD:  04/02/00 Job: [id number]

## 2011-07-05 ENCOUNTER — Other Ambulatory Visit: Payer: Self-pay | Admitting: *Deleted

## 2011-07-05 MED ORDER — ESOMEPRAZOLE MAGNESIUM 40 MG PO CPDR: 40.0000 mg | DELAYED_RELEASE_CAPSULE | Freq: Every day | ORAL | Status: DC

## 2011-07-05 NOTE — Telephone Encounter (Signed)
rx sent

## 2011-11-07 ENCOUNTER — Other Ambulatory Visit: Payer: Self-pay | Admitting: Neurological Surgery

## 2011-11-07 DIAGNOSIS — M47812 Spondylosis without myelopathy or radiculopathy, cervical region: Secondary | ICD-10-CM

## 2011-11-09 ENCOUNTER — Ambulatory Visit
Admission: RE | Admit: 2011-11-09 | Discharge: 2011-11-09 | Disposition: A | Discharge status: Home / Self Care | Payer: BC Managed Care – PPO | Source: Ambulatory Visit | Attending: Neurological Surgery | Admitting: Neurological Surgery

## 2011-11-09 DIAGNOSIS — M47812 Spondylosis without myelopathy or radiculopathy, cervical region: Secondary | ICD-10-CM

## 2011-11-30 ENCOUNTER — Other Ambulatory Visit: Payer: Self-pay | Admitting: Neurological Surgery

## 2011-12-04 ENCOUNTER — Encounter (HOSPITAL_COMMUNITY): Payer: Self-pay | Admitting: Pharmacy Technician

## 2011-12-05 ENCOUNTER — Encounter (HOSPITAL_COMMUNITY): Payer: Self-pay | Admitting: Pharmacy Technician

## 2011-12-07 ENCOUNTER — Encounter (HOSPITAL_COMMUNITY)
Admission: RE | Admit: 2011-12-07 | Discharge: 2011-12-07 | Disposition: A | Discharge status: Home / Self Care | Payer: BC Managed Care – PPO | Source: Ambulatory Visit | Attending: Neurological Surgery | Admitting: Neurological Surgery

## 2011-12-07 ENCOUNTER — Encounter (HOSPITAL_COMMUNITY): Payer: Self-pay

## 2011-12-07 ENCOUNTER — Encounter (HOSPITAL_COMMUNITY)
Admission: RE | Admit: 2011-12-07 | Discharge: 2011-12-07 | Disposition: A | Discharge status: Home / Self Care | Payer: BC Managed Care – PPO | Source: Ambulatory Visit | Attending: Family Medicine | Admitting: Family Medicine

## 2011-12-07 HISTORY — DX: Other fracture of left lower leg, initial encounter for closed fracture: S82.892A

## 2011-12-07 HISTORY — DX: Malignant (primary) neoplasm, unspecified: C80.1

## 2011-12-07 HISTORY — DX: Gastro-esophageal reflux disease without esophagitis: K21.9

## 2011-12-07 HISTORY — DX: Other amnesia: R41.3

## 2011-12-07 LAB — BASIC METABOLIC PANEL WITH GFR
BUN: 17 mg/dL (ref 6–23)
CO2: 29 meq/L (ref 19–32)
Calcium: 9 mg/dL (ref 8.4–10.5)
Chloride: 100 meq/L (ref 96–112)
Creatinine, Ser: 0.71 mg/dL (ref 0.50–1.10)
GFR calc Af Amer: >90 mL/min
GFR calc non Af Amer: >90 mL/min
Glucose, Bld: 86 mg/dL (ref 70–99)
Potassium: 3.7 meq/L (ref 3.5–5.1)
Sodium: 137 meq/L (ref 135–145)

## 2011-12-07 LAB — MRSA PCR SCREENING: MRSA by PCR: NEGATIVE

## 2011-12-07 LAB — CBC
HCT: 40.5 % (ref 36.0–46.0)
Hemoglobin: 13.7 g/dL (ref 12.0–15.0)
MCH: 30.9 pg (ref 26.0–34.0)
MCHC: 33.8 g/dL (ref 30.0–36.0)
MCV: 91.4 fL (ref 78.0–100.0)
RBC: 4.43 MIL/uL (ref 3.87–5.11)

## 2011-12-07 NOTE — Progress Notes (Signed)
Records requested from Dr Donnie Aho; pt has ov 12-09-2009; stress test done 2010, ECHO 2010

## 2011-12-07 NOTE — Progress Notes (Signed)
Labs and cxr reviewed. Records received from Dr Donnie Aho.

## 2011-12-07 NOTE — Pre-Procedure Instructions (Signed)
20 [name] Stone  12/07/2011   Your procedure is scheduled on:  Tues, Dec 11  Report to [facility] at 0700 AM.  Call this number if you have problems the morning of surgery: [phone]   Remember:   Do not eat food:After Midnight.  May have clear liquids: up to 4 Hours before arrival.  Clear liquids include soda, tea, black coffee, apple or grape juice, broth.  Take these medicines the morning of surgery with A SIP OF WATER :   Bystolic,Nexium    Do not wear jewelry, make-up or nail polish.  Do not wear lotions, powders, or perfumes. You may wear deodorant.  Do not shave 48 hours prior to surgery.  Do not bring valuables to the hospital.  Contacts, dentures or bridgework may not be worn into surgery.  Leave suitcase in the car. After surgery it may be brought to your room.  For patients admitted to the hospital, checkout time is 11:00 AM the day of discharge.   Patients discharged the day of surgery will not be allowed to drive home.  Name and phone number of your driver: N/A  Special Instructions: CHG Shower Use Special Wash: 1/2 bottle night before surgery and 1/2 bottle morning of surgery.   Please read over the following fact sheets that you were given: Pain Booklet, Coughing and Deep Breathing, MRSA Information and Surgical Site Infection Prevention

## 2011-12-10 MED ORDER — CEFAZOLIN SODIUM 1-5 GM-% IV SOLN
1.0000 g | INTRAVENOUS | Status: AC
  Administered 2011-12-11: 1 g via INTRAVENOUS
  Filled 2011-12-10: qty 50

## 2011-12-11 ENCOUNTER — Ambulatory Visit (HOSPITAL_COMMUNITY)
Admission: RE | Admit: 2011-12-11 | Discharge: 2011-12-12 | Disposition: A | Discharge status: Home / Self Care | Payer: BC Managed Care – PPO | Source: Ambulatory Visit | Attending: Neurological Surgery | Admitting: Neurological Surgery

## 2011-12-11 ENCOUNTER — Encounter (HOSPITAL_COMMUNITY): Payer: Self-pay | Admitting: Anesthesiology

## 2011-12-11 ENCOUNTER — Ambulatory Visit (HOSPITAL_COMMUNITY): Payer: BC Managed Care – PPO

## 2011-12-11 ENCOUNTER — Encounter (HOSPITAL_COMMUNITY): Payer: Self-pay | Admitting: *Deleted

## 2011-12-11 ENCOUNTER — Ambulatory Visit (HOSPITAL_COMMUNITY): Payer: BC Managed Care – PPO | Admitting: Anesthesiology

## 2011-12-11 ENCOUNTER — Encounter (HOSPITAL_COMMUNITY)
Admission: RE | Disposition: A | Discharge status: Home / Self Care | Payer: Self-pay | Source: Ambulatory Visit | Attending: Neurological Surgery

## 2011-12-11 DIAGNOSIS — Z01812 Encounter for preprocedural laboratory examination: Secondary | ICD-10-CM | POA: Insufficient documentation

## 2011-12-11 DIAGNOSIS — M47812 Spondylosis without myelopathy or radiculopathy, cervical region: Secondary | ICD-10-CM | POA: Insufficient documentation

## 2011-12-11 DIAGNOSIS — I498 Other specified cardiac arrhythmias: Secondary | ICD-10-CM | POA: Insufficient documentation

## 2011-12-11 DIAGNOSIS — M4722 Other spondylosis with radiculopathy, cervical region: Secondary | ICD-10-CM | POA: Diagnosis present

## 2011-12-11 DIAGNOSIS — M5020 Other cervical disc displacement, unspecified cervical region: Secondary | ICD-10-CM | POA: Insufficient documentation

## 2011-12-11 DIAGNOSIS — Z981 Arthrodesis status: Secondary | ICD-10-CM | POA: Insufficient documentation

## 2011-12-11 DIAGNOSIS — Z01818 Encounter for other preprocedural examination: Secondary | ICD-10-CM | POA: Insufficient documentation

## 2011-12-11 DIAGNOSIS — K219 Gastro-esophageal reflux disease without esophagitis: Secondary | ICD-10-CM | POA: Insufficient documentation

## 2011-12-11 HISTORY — PX: ANTERIOR CERVICAL DECOMP/DISCECTOMY FUSION: SHX1161

## 2011-12-11 SURGERY — ANTERIOR CERVICAL DECOMPRESSION/DISCECTOMY FUSION 2 LEVELS
Anesthesia: General | Site: Spine Cervical | Wound class: Clean

## 2011-12-11 MED ORDER — SENNA 8.6 MG PO TABS
1.0000 | ORAL_TABLET | Freq: Two times a day (BID) | ORAL | Status: DC
  Administered 2011-12-11: 8.6 mg via ORAL
  Filled 2011-12-11 (×3): qty 1

## 2011-12-11 MED ORDER — ACETAMINOPHEN 325 MG PO TABS: 650.0000 mg | ORAL_TABLET | ORAL | Status: DC | PRN

## 2011-12-11 MED ORDER — LACTATED RINGERS IV SOLN: INTRAVENOUS | Status: DC

## 2011-12-11 MED ORDER — NEOSTIGMINE METHYLSULFATE 1 MG/ML IJ SOLN
INTRAMUSCULAR | Status: DC | PRN
  Administered 2011-12-11: 3 mg via INTRAVENOUS

## 2011-12-11 MED ORDER — NEBIVOLOL HCL 5 MG PO TABS
5.0000 mg | ORAL_TABLET | Freq: Every day | ORAL | Status: DC
  Administered 2011-12-12: 5 mg via ORAL
  Filled 2011-12-11 (×2): qty 1

## 2011-12-11 MED ORDER — ROCURONIUM BROMIDE 100 MG/10ML IV SOLN
INTRAVENOUS | Status: DC | PRN
  Administered 2011-12-11: 10 mg via INTRAVENOUS
  Administered 2011-12-11: 50 mg via INTRAVENOUS

## 2011-12-11 MED ORDER — ONDANSETRON HCL 4 MG/2ML IJ SOLN: 4.0000 mg | INTRAMUSCULAR | Status: DC | PRN

## 2011-12-11 MED ORDER — PANTOPRAZOLE SODIUM 40 MG PO TBEC
40.0000 mg | DELAYED_RELEASE_TABLET | Freq: Every day | ORAL | Status: DC
  Administered 2011-12-12: 40 mg via ORAL
  Filled 2011-12-11: qty 1

## 2011-12-11 MED ORDER — FENTANYL CITRATE 0.05 MG/ML IJ SOLN
INTRAMUSCULAR | Status: DC | PRN
  Administered 2011-12-11: 100 ug via INTRAVENOUS
  Administered 2011-12-11: 50 ug via INTRAVENOUS
  Administered 2011-12-11: 100 ug via INTRAVENOUS

## 2011-12-11 MED ORDER — MIDAZOLAM HCL 5 MG/5ML IJ SOLN
INTRAMUSCULAR | Status: DC | PRN
  Administered 2011-12-11: 2 mg via INTRAVENOUS

## 2011-12-11 MED ORDER — SODIUM CHLORIDE 0.9 % IJ SOLN: 3.0000 mL | INTRAMUSCULAR | Status: DC | PRN

## 2011-12-11 MED ORDER — PROPOFOL 10 MG/ML IV EMUL
INTRAVENOUS | Status: DC | PRN
  Administered 2011-12-11: 160 mg via INTRAVENOUS
  Administered 2011-12-11: 40 mg via INTRAVENOUS

## 2011-12-11 MED ORDER — OXYCODONE-ACETAMINOPHEN 5-325 MG PO TABS
1.0000 | ORAL_TABLET | ORAL | Status: DC | PRN
  Administered 2011-12-11 – 2011-12-12 (×3): 1 via ORAL
  Filled 2011-12-11 (×3): qty 1

## 2011-12-11 MED ORDER — MENTHOL 3 MG MT LOZG
1.0000 | LOZENGE | OROMUCOSAL | Status: DC | PRN
  Filled 2011-12-11: qty 9

## 2011-12-11 MED ORDER — ONDANSETRON HCL 4 MG/2ML IJ SOLN
INTRAMUSCULAR | Status: DC | PRN
  Administered 2011-12-11: 4 mg via INTRAVENOUS

## 2011-12-11 MED ORDER — SODIUM CHLORIDE 0.9 % IV SOLN
INTRAVENOUS | Status: AC
  Filled 2011-12-11: qty 500

## 2011-12-11 MED ORDER — LIDOCAINE-EPINEPHRINE 1 %-1:100000 IJ SOLN
INTRAMUSCULAR | Status: DC | PRN
  Administered 2011-12-11: 30 mL

## 2011-12-11 MED ORDER — ESTRADIOL 0.05 MG/24HR TD PTWK
0.0500 mg | MEDICATED_PATCH | TRANSDERMAL | Status: DC
  Filled 2011-12-11: qty 1

## 2011-12-11 MED ORDER — DEXAMETHASONE SODIUM PHOSPHATE 4 MG/ML IJ SOLN
INTRAMUSCULAR | Status: DC | PRN
  Administered 2011-12-11: 10 mg via INTRAVENOUS

## 2011-12-11 MED ORDER — POLYETHYLENE GLYCOL 3350 17 G PO PACK
17.0000 g | PACK | Freq: Every day | ORAL | Status: DC | PRN
  Filled 2011-12-11: qty 1

## 2011-12-11 MED ORDER — DOCUSATE SODIUM 100 MG PO CAPS
100.0000 mg | ORAL_CAPSULE | Freq: Two times a day (BID) | ORAL | Status: DC
  Administered 2011-12-11 – 2011-12-12 (×2): 100 mg via ORAL
  Filled 2011-12-11 (×2): qty 1

## 2011-12-11 MED ORDER — BACITRACIN 50000 UNITS IM SOLR
INTRAMUSCULAR | Status: AC
  Filled 2011-12-11: qty 50000

## 2011-12-11 MED ORDER — HYDROMORPHONE HCL PF 1 MG/ML IJ SOLN
INTRAMUSCULAR | Status: AC
  Filled 2011-12-11: qty 1

## 2011-12-11 MED ORDER — DIAZEPAM 5 MG PO TABS
5.0000 mg | ORAL_TABLET | Freq: Four times a day (QID) | ORAL | Status: DC | PRN
  Administered 2011-12-11 – 2011-12-12 (×3): 5 mg via ORAL
  Filled 2011-12-11 (×3): qty 1

## 2011-12-11 MED ORDER — ALUM & MAG HYDROXIDE-SIMETH 200-200-20 MG/5ML PO SUSP: 30.0000 mL | Freq: Four times a day (QID) | ORAL | Status: DC | PRN

## 2011-12-11 MED ORDER — THROMBIN 5000 UNITS EX KIT
PACK | CUTANEOUS | Status: DC | PRN
  Administered 2011-12-11: 5000 [IU] via TOPICAL

## 2011-12-11 MED ORDER — SODIUM CHLORIDE 0.9 % IJ SOLN
3.0000 mL | Freq: Two times a day (BID) | INTRAMUSCULAR | Status: DC
  Administered 2011-12-11 (×2): 3 mL via INTRAVENOUS

## 2011-12-11 MED ORDER — PROMETHAZINE HCL 25 MG/ML IJ SOLN
INTRAMUSCULAR | Status: AC
  Administered 2011-12-11: 6.25 mg via INTRAVENOUS
  Filled 2011-12-11: qty 1

## 2011-12-11 MED ORDER — SODIUM CHLORIDE 0.9 % IR SOLN
Status: DC | PRN
  Administered 2011-12-11: 12:00:00

## 2011-12-11 MED ORDER — ACETAMINOPHEN 650 MG RE SUPP: 650.0000 mg | RECTAL | Status: DC | PRN

## 2011-12-11 MED ORDER — LACTATED RINGERS IV SOLN
INTRAVENOUS | Status: DC | PRN
  Administered 2011-12-11 (×2): via INTRAVENOUS

## 2011-12-11 MED ORDER — GLYCOPYRROLATE 0.2 MG/ML IJ SOLN
INTRAMUSCULAR | Status: DC | PRN
  Administered 2011-12-11: .4 mg via INTRAVENOUS

## 2011-12-11 MED ORDER — PROMETHAZINE HCL 25 MG/ML IJ SOLN
6.2500 mg | INTRAMUSCULAR | Status: DC | PRN
  Administered 2011-12-11: 6.25 mg via INTRAVENOUS

## 2011-12-11 MED ORDER — SODIUM CHLORIDE 0.9 % IV SOLN: 250.0000 mL | INTRAVENOUS | Status: DC

## 2011-12-11 MED ORDER — SODIUM CHLORIDE 0.9 % IR SOLN
Status: DC | PRN
  Administered 2011-12-11: 1000 mL

## 2011-12-11 MED ORDER — PHENOL 1.4 % MT LIQD: 1.0000 | OROMUCOSAL | Status: DC | PRN

## 2011-12-11 MED ORDER — BUPIVACAINE HCL (PF) 0.25 % IJ SOLN
INTRAMUSCULAR | Status: DC | PRN
  Administered 2011-12-11: 20 mL

## 2011-12-11 MED ORDER — HYDROMORPHONE HCL PF 1 MG/ML IJ SOLN
0.2500 mg | INTRAMUSCULAR | Status: DC | PRN
  Administered 2011-12-11 (×2): 0.25 mg via INTRAVENOUS

## 2011-12-11 SURGICAL SUPPLY — 57 items
ADH SKN CLS APL DERMABOND .7 (GAUZE/BANDAGES/DRESSINGS) ×1
BAG DECANTER FOR FLEXI CONT (MISCELLANEOUS) ×2 IMPLANT
BANDAGE GAUZE ELAST BULKY 4 IN (GAUZE/BANDAGES/DRESSINGS) ×4 IMPLANT
BIT DRILL NEURO 2X3.1 SFT TUCH (MISCELLANEOUS) ×1 IMPLANT
BONE CERVICAL 6MM LRG (Bone Implant) ×2 IMPLANT
BUR BARREL STRAIGHT FLUTE 4.0 (BURR) ×2 IMPLANT
CANISTER SUCTION 2500CC (MISCELLANEOUS) ×2 IMPLANT
CLOTH BEACON ORANGE TIMEOUT ST (SAFETY) ×2 IMPLANT
CONT SPEC 4OZ CLIKSEAL STRL BL (MISCELLANEOUS) ×4 IMPLANT
DECANTER SPIKE VIAL GLASS SM (MISCELLANEOUS) ×2 IMPLANT
DERMABOND ADVANCED (GAUZE/BANDAGES/DRESSINGS) ×1
DERMABOND ADVANCED .7 DNX12 (GAUZE/BANDAGES/DRESSINGS) ×1 IMPLANT
DRAPE LAPAROTOMY 100X72 PEDS (DRAPES) ×2 IMPLANT
DRAPE MICROSCOPE LEICA (MISCELLANEOUS) IMPLANT
DRAPE POUCH INSTRU U-SHP 10X18 (DRAPES) ×2 IMPLANT
DRESSING TELFA 8X3 (GAUZE/BANDAGES/DRESSINGS) ×1 IMPLANT
DRILL NEURO 2X3.1 SOFT TOUCH (MISCELLANEOUS) ×2
DRSG OPSITE 4X5.5 SM (GAUZE/BANDAGES/DRESSINGS) ×1 IMPLANT
DURAPREP 6ML APPLICATOR 50/CS (WOUND CARE) ×2 IMPLANT
ELECT REM PT RETURN 9FT ADLT (ELECTROSURGICAL) ×2
ELECTRODE REM PT RTRN 9FT ADLT (ELECTROSURGICAL) ×1 IMPLANT
GAUZE SPONGE 4X4 16PLY XRAY LF (GAUZE/BANDAGES/DRESSINGS) IMPLANT
GLOVE BIO SURGEON STRL SZ7.5 (GLOVE) IMPLANT
GLOVE BIOGEL PI IND STRL 7.5 (GLOVE) IMPLANT
GLOVE BIOGEL PI IND STRL 8.5 (GLOVE) ×1 IMPLANT
GLOVE BIOGEL PI INDICATOR 7.5 (GLOVE)
GLOVE BIOGEL PI INDICATOR 8.5 (GLOVE) ×1
GLOVE ECLIPSE 8.5 STRL (GLOVE) ×2 IMPLANT
GLOVE EXAM NITRILE LRG STRL (GLOVE) IMPLANT
GLOVE EXAM NITRILE MD LF STRL (GLOVE) IMPLANT
GLOVE EXAM NITRILE XL STR (GLOVE) IMPLANT
GLOVE EXAM NITRILE XS STR PU (GLOVE) IMPLANT
GOWN BRE IMP SLV AUR LG STRL (GOWN DISPOSABLE) IMPLANT
GOWN BRE IMP SLV AUR XL STRL (GOWN DISPOSABLE) ×4 IMPLANT
GOWN STRL REIN 2XL LVL4 (GOWN DISPOSABLE) ×2 IMPLANT
HEAD HALTER (SOFTGOODS) ×2 IMPLANT
KIT BASIN OR (CUSTOM PROCEDURE TRAY) ×2 IMPLANT
KIT ROOM TURNOVER OR (KITS) ×2 IMPLANT
NDL SPNL 22GX3.5 QUINCKE BK (NEEDLE) ×1 IMPLANT
NEEDLE HYPO 22GX1.5 SAFETY (NEEDLE) ×2 IMPLANT
NEEDLE SPNL 22GX3.5 QUINCKE BK (NEEDLE) ×2 IMPLANT
NS IRRIG 1000ML POUR BTL (IV SOLUTION) ×2 IMPLANT
PACK LAMINECTOMY NEURO (CUSTOM PROCEDURE TRAY) ×2 IMPLANT
PAD ARMBOARD 7.5X6 YLW CONV (MISCELLANEOUS) ×6 IMPLANT
PATTIES SURGICAL 1X1 (DISPOSABLE) ×1 IMPLANT
PLATE 34MM (Plate) ×1 IMPLANT
PUTTY BONE 2.5CC ×1 IMPLANT
RUBBERBAND STERILE (MISCELLANEOUS) IMPLANT
SCREW 14MM (Screw) ×6 IMPLANT
SPONGE GAUZE 4X4 12PLY (GAUZE/BANDAGES/DRESSINGS) ×1 IMPLANT
SPONGE INTESTINAL PEANUT (DISPOSABLE) ×2 IMPLANT
SPONGE SURGIFOAM ABS GEL SZ50 (HEMOSTASIS) ×2 IMPLANT
SUT VIC AB 3-0 SH 8-18 (SUTURE) ×4 IMPLANT
SYR 20ML ECCENTRIC (SYRINGE) ×2 IMPLANT
TOWEL OR 17X24 6PK STRL BLUE (TOWEL DISPOSABLE) ×2 IMPLANT
TOWEL OR 17X26 10 PK STRL BLUE (TOWEL DISPOSABLE) ×2 IMPLANT
WATER STERILE IRR 1000ML POUR (IV SOLUTION) ×2 IMPLANT

## 2011-12-11 NOTE — Transfer of Care (Signed)
Immediate Anesthesia Transfer of Care Note  Patient: [name] Stone  Procedure(s) Performed:  ANTERIOR CERVICAL DECOMPRESSION/DISCECTOMY FUSION 2 LEVELS - C5-6 C6-7 Anterior cervical decompression/diskectomy fusion  Patient Location: PACU  Anesthesia Type: General  Level of Consciousness: awake, alert , oriented and patient cooperative  Airway & Oxygen Therapy: Patient Spontanous Breathing and Patient connected to nasal cannula oxygen  Post-op Assessment: Report given to PACU RN, Post -op Vital signs reviewed and stable and Patient moving all extremities  Post vital signs: Reviewed and stable  Complications: No apparent anesthesia complications

## 2011-12-11 NOTE — H&P (Signed)
[name] Stone. [name] Stone  #[id number] DOB:  06-27-1951 11/30/2011:  [name] Stone returns to the office today to discuss the results of her MRI of her cervical spine that was completed on 11/9. The study demonstrates that [name] Stone has advanced spondylitic changes at C5-6 and C6-7 with a left-sided disc herniation particularly at C5-6 that effaces her spinal cord and also compresses the exiting nerve root at C6.  C7 has a very stenotic foramen on the left side, moderate stenosis also on the right side, again due to a combination of disc degenerative changes and spondylitic ridging.    I demonstrated the findings to [name] Stone and I believe that ultimately this is going to need surgical decompression and stabilization.  In talking with her, I apparently indicated some ten years ago that she had some adjacent level disease at 5-6 and 6-7 and now it seems that this process has worsened to the point where ultimately surgical decompression is what is required.  We discussed efforts at conservative management, but noting that she is already having the numbness and dysesthesias in that left upper extremity, I believe that a 2-level anterior decompression and arthrodesis at 5-6 and 6-7 is what [name] Stone will need. She is going to have a period of time where she can consider having this done and she will be out of the workplace, and we will try to schedule this to meet her needs there.          [name] Stone, M.D./[facility]  [name] Stone. [place]  #[id number]  DOB:  05-Nov-1951     November 02, 2011   CHIEF COMPLAINT:   [name] Stone returns to the office today.  She was last seen in 2008 and she was having some difficulty with some lower cervical radiculopathy.  [name] Stone had had an anterior cervical decompression and arthrodesis at C3-4 and C4-5 back in 2001 and she had both radiculopathic and myelopathic symptoms.  She has done reasonably well in the interim, but she notes that she has developed some symptoms into her left neck, shoulder and  arm with radiation of pain and numbness into the thumb and index finger on that left side.  She also notes it involves the long finger on that left side also.  Today in the office to further her workup, I obtained new plain radiographs of the cervical spine.  These demonstrate that her anterior fusion at C3-4 and 4-5 is nicely healed with bone well incorporated.  She does have some adjacent level spondylosis at the level of C5-6 and to a lesser extent at the level of C6-7 below her previous fusion.  Motion in her cervical spine from flexion to extension is within the limits of normal.  She has felt some generalized stiffness in the neck, but no real since of pain or discomfort in the cervical spine itself.    PAST MEDICAL HISTORY:  She has been essentially stable.  No new changes are noted.    PERSONAL HISTORY:   She notes her weight and height are stable at 5', 6" and 156 lbs.    REVIEW OF SYSTEMS:   Systems review is notable only for the items in the History of Present Illness.    CURRENT MEDICATIONS:  Vivelle Dot, Nexium, Cytomel and Bystolic.    DEMEROL WHICH CAUSES HALLUCINATIONS.    PHYSICAL EXAMINATION:  I note that her range of motion limits her motion to 45 degrees turning left and right.  She extends and flexes about 80% of normal.  There  is no palpable tenderness in the cervical spine or in the supraclavicular fossas.  There is no significant spasm in the major neck muscles, including the trapezii.  Her motor strength is good in the deltoids, biceps, triceps, grips and intrinsics.  Reflexes are absent, however, on the left side in the  [name] Stone  #[id number]  DOB:  [birth year]     November 02, 2011  Page Two   biceps and triceps, 1+ in the biceps, absent in the right triceps.  Patellar reflexes are 2+ on the right side and absent on the left side and the Achilles reflex is trace bilaterally.    IMPRESSION:    The patient has evidence of adjacent level spondylitic disease by plain  radiographs most notably at C5-6 and C6-7.  The description of her pain syndrome is in a C6 distribution and I suggested to [place] that it may in fact be that she has some adjacent level disease that may be aggravating these symptoms.  Further workup would require an MRI of the cervical spine and I believe that this would be the next step to perform.  Depending on what we see there will determine whether this is a process that might respond well to conservative management, either an epidural steroid injection and/or physical therapy, or if it is something that may require some surgical intervention.  I will visit back with her when the MRI is complete.    [facility]    [name] Stone, M.D.

## 2011-12-11 NOTE — Preoperative (Signed)
Beta Blockers   Reason not to administer Beta Blockers:Took bystolic 12/11 AM

## 2011-12-11 NOTE — Anesthesia Preprocedure Evaluation (Addendum)
Anesthesia Evaluation  Patient identified by MRN, date of birth, ID band Patient awake    Reviewed: Allergy & Precautions, H&P , NPO status , Patient's Chart, lab work & pertinent test results, reviewed documented beta blocker date and time   History of Anesthesia Complications (+) Family history of anesthesia reaction  Airway Mallampati: I TM Distance: >3 FB Neck ROM: Full    Dental  (+) Teeth Intact   Pulmonary neg pulmonary ROS,  clear to auscultation        Cardiovascular + dysrhythmias Supra Ventricular Tachycardia Regular Normal    Neuro/Psych Negative Neurological ROS     GI/Hepatic Neg liver ROS, GERD-  ,  Endo/Other  Negative Endocrine ROS  Renal/GU      Musculoskeletal   Abdominal   Peds  Hematology   Anesthesia Other Findings   Reproductive/Obstetrics                          Anesthesia Physical Anesthesia Plan  ASA: II  Anesthesia Plan: General   Post-op Pain Management:    Induction: Intravenous  Airway Management Planned: Oral ETT  Additional Equipment:   Intra-op Plan:   Post-operative Plan: Extubation in OR  Informed Consent: I have reviewed the patients History and Physical, chart, labs and discussed the procedure including the risks, benefits and alternatives for the proposed anesthesia with the patient or authorized representative who has indicated his/her understanding and acceptance.   Dental advisory given  Plan Discussed with: CRNA and Surgeon  Anesthesia Plan Comments:         Anesthesia Quick Evaluation

## 2011-12-11 NOTE — Op Note (Signed)
Preoperative diagnosis: Cervical spondylosis with radiculopathy C5-6 and C6-C7. Status post arthrodesis C3-4 C4-5 Post operative diagnosis: Cervical spondylosis with radiculopathy C5-6 and C6-C7. Status post arthrodesis C3-4 C4-5 Procedure: Anterior cervical discectomy decompression of nerve roots and spinal canal C5-6 and C6-C7 arthrodesis with structural allograft, Alphatec plate fixation Z6-1 and C6-C7, removal of inferior portion of old plate Surgeon: Barnett Abu M.D. Asst.: Hilda Lias M.D. Indications: Patient is a 60 year old individual who's had previous cervical spondylitic disease with radiculopathy in 1998 she underwent anterior cervical decompression arthrodesis at C3-4 and C4-5 she has done well until past year were she's developed significant cervical radiculopathy into the arm with numbness in the fingertips she has failed all manner of conservative care has now been advised regarding the need for surgical intervention. Procedure: The patient was brought to the operating room placed on the table in supine position. After the smooth induction of general endotracheal anesthesia neck was placed in 5 pounds of halter traction and prepped with alcohol and DuraPrep. After sterile draping and appropriate timeout procedure a transverse incision was created in the left side of the neck and carried down to the platysma. The plane between the sternocleidomastoid and strap muscles dissected bluntly until the prevertebral space was reached. The first identifiable disc space was noted to be C5-C6 below the previously noted cervical plate. The dissection was then undertaken in the longus coli muscle to allow placement of a self-retaining Caspar type retractor.  The anterior longitudinal ligament was opened at C5-C6 and ventral osteophytes were removed with a Leksell rongeur and Kerrison punch. Interspace was cleared of significant quantity of the degenerated disc material in the region of the posterior  longitudinal ligament was reached. Dissection was carried out using a high-speed drill and 3-0 Karlin curettes. Uncinate processes were drilled down and removed and osteophytes from the inferior margin of the body of the for were removed with a Kerrison 2 mm gold punch. After the central canal and lateral recesses were well decompressed the stasis was achieved with the bipolar cautery and some small pledgets of Gelfoam soaked in thrombin that were later irrigated away.  A 6 mm transgraft was then prepared by enlarging the central cavity and filling with demineralized bone matrix and placing into the interspace. C6-C7 Was decompressed and fused in a similar fashion.   Next the retractor was removed and a 34 mm trestle plate was placed over the vertebral bodies and secured with working 14 millimeter variable angle screws. A final localizing radiograph identified the position of the surgical construct. The stasis was achieved in the soft tissues and then the platysma was closed with 3-0 Vicryl in an interrupted fashion and 3-0 Vicryl was used in the subcuticular tissue. Blood loss was estimated at 75 cc .

## 2011-12-11 NOTE — Anesthesia Postprocedure Evaluation (Signed)
  Anesthesia Post-op Note  Patient: [name] Stone  Procedure(s) Performed:  ANTERIOR CERVICAL DECOMPRESSION/DISCECTOMY FUSION 2 LEVELS - C5-6 C6-7 Anterior cervical decompression/diskectomy fusion  Patient Location: PACU  Anesthesia Type: General  Level of Consciousness: awake  Airway and Oxygen Therapy: Patient Spontanous Breathing  Post-op Pain: mild  Post-op Assessment: Post-op Vital signs reviewed  Post-op Vital Signs: stable  Complications: No apparent anesthesia complications

## 2011-12-12 ENCOUNTER — Encounter (HOSPITAL_COMMUNITY): Payer: Self-pay | Admitting: Neurological Surgery

## 2011-12-12 MED ORDER — DIAZEPAM 5 MG PO TABS: 5.0000 mg | ORAL_TABLET | Freq: Four times a day (QID) | ORAL | Status: AC | PRN

## 2011-12-12 MED ORDER — OXYCODONE-ACETAMINOPHEN 5-325 MG PO TABS: 1.0000 | ORAL_TABLET | ORAL | Status: AC | PRN

## 2011-12-30 ENCOUNTER — Other Ambulatory Visit: Payer: Self-pay | Admitting: Internal Medicine

## 2012-03-05 ENCOUNTER — Other Ambulatory Visit: Payer: Self-pay | Admitting: Neurological Surgery

## 2012-03-05 DIAGNOSIS — M546 Pain in thoracic spine: Secondary | ICD-10-CM

## 2012-03-11 ENCOUNTER — Ambulatory Visit
Admission: RE | Admit: 2012-03-11 | Discharge: 2012-03-11 | Disposition: A | Discharge status: Home / Self Care | Payer: BC Managed Care – PPO | Source: Ambulatory Visit | Attending: Neurological Surgery | Admitting: Neurological Surgery

## 2012-03-11 DIAGNOSIS — M546 Pain in thoracic spine: Secondary | ICD-10-CM

## 2012-05-21 ENCOUNTER — Other Ambulatory Visit: Payer: Self-pay | Admitting: Internal Medicine

## 2012-07-25 ENCOUNTER — Other Ambulatory Visit: Payer: Self-pay | Admitting: Internal Medicine

## 2012-11-14 ENCOUNTER — Other Ambulatory Visit: Payer: Self-pay | Admitting: Internal Medicine

## 2012-11-14 MED ORDER — ESOMEPRAZOLE MAGNESIUM 40 MG PO CPDR: 40.0000 mg | DELAYED_RELEASE_CAPSULE | Freq: Every day | ORAL | Status: DC

## 2012-11-14 NOTE — Telephone Encounter (Signed)
rx sent

## 2012-12-05 ENCOUNTER — Encounter: Payer: Self-pay | Admitting: *Deleted

## 2013-01-06 ENCOUNTER — Ambulatory Visit (INDEPENDENT_AMBULATORY_CARE_PROVIDER_SITE_OTHER): Payer: BC Managed Care – PPO | Admitting: Internal Medicine

## 2013-01-06 ENCOUNTER — Encounter: Payer: Self-pay | Admitting: Internal Medicine

## 2013-01-06 VITALS — BP 100/60 | HR 66 | Ht 66.0 in | Wt 169.8 lb

## 2013-01-06 DIAGNOSIS — K219 Gastro-esophageal reflux disease without esophagitis: Secondary | ICD-10-CM

## 2013-01-06 MED ORDER — ESOMEPRAZOLE MAGNESIUM 40 MG PO CPDR: 40.0000 mg | DELAYED_RELEASE_CAPSULE | Freq: Every day | ORAL | Status: DC

## 2013-01-06 NOTE — Patient Instructions (Addendum)
We have sent the following medications to your pharmacy for you to pick up at your convenience: Nexium  You will be due for a recall colonoscopy in 12/2020. We will send you a reminder in the mail when it gets closer to that time.  CC: Dr Juluis Rainier

## 2013-01-06 NOTE — Progress Notes (Signed)
[name] Stone [birth year] MRN [id number]    History of Present Illness:  This is a 62 year old white female with gastroesophageal reflux disease which is well controlled on Nexium 40 mg daily. She tried omeprazole and Protonix in the past with suboptimal effect. She is here to refill her Nexium. She denies nocturnal cough, hoarseness or chest pain. Very rarely, she experiences episodes of reflux. She is up-to-date on her colonoscopy. Her last exam in January 2012 showed polypoid mucosa in a polyp. She has a family history of colon polyps in her brother and mother. Her last upper endoscopy in January 2007 showed mild chronic esophagitis, 1-2 cm hiatal hernia. Biopsies showed fibrosis with mild inflammation.   Past Medical History  Diagnosis Date  . PAC (premature atrial contraction)   . GERD (gastroesophageal reflux disease)   . Cancer     basal cell ca removed from arm  . Complaints of memory disturbance     evaluated by Dr  [name] Stone  . Ankle fracture, left 2005  . Esophageal stricture   . Esophagitis    Past Surgical History  Procedure Date  . Anterior fusion cervical spine 2000  . Abdominal hysterectomy 1998  . Knee arthroscopy w/ lateral release   . Knee arthroscopy w/ meniscal repair   . Eye surgery     x 5 right eye  . Corneal transplant   . Colonoscopy     every 5 years for FH colon polyps  . Anterior cervical decomp/discectomy fusion 12/11/2011    Procedure: ANTERIOR CERVICAL DECOMPRESSION/DISCECTOMY FUSION 2 LEVELS;  Surgeon: [name] Stone;  Location: [facility];  Service: Neurosurgery;  Laterality: N/A;  C5-6 C6-7 Anterior cervical decompression/diskectomy fusion  . Incontinence surgery     reports that she has never smoked. She has never used smokeless tobacco. She reports that she does not drink alcohol or use illicit drugs. family history includes Colitis in her mother; Colon polyps in her brother; and Skin cancer in her brother, father, and paternal grandfather.  There  is no history of Colon cancer. Allergies  Allergen Reactions  . Aspirin     REACTION: retinal hemorhage  . Demerol (Meperidine Hcl)     REACTION: hallucinations        Review of Systems: Denies dysphagia odynophagia or chest pain  The remainder of the 10 point ROS is negative except as outlined in H&P   Physical Exam: General appearance  Well developed, in no distress. Eyes- non icteric. HEENT nontraumatic, normocephalic. Mouth no lesions, tongue papillated, no cheilosis. Neck supple without adenopathy, thyroid not enlarged, no carotid bruits, no JVD. Lungs Clear to auscultation bilaterally. Cor normal S1, normal S2, regular rhythm, no murmur,  quiet precordium. Abdomen: No tenderness. Normal active bowel sounds. Rectal: Not done. Extremities no pedal edema. Skin no lesions. Neurological alert and oriented x 3. Psychological normal mood and affect.  Assessment and Plan:  Problem #1 Gastroesophageal reflux disease controlled on Nexium 40 mg daily. She may take the Prilosec 20 mg when necessary for breakthrough symptoms. She should continue antireflux measures.  Problem #2 Colorectal screening. Her last colonoscopy was in January 2012. Her next colonoscopy will be due in January 2022.    01/06/2013 [name] Stone

## 2013-04-29 ENCOUNTER — Encounter: Payer: Self-pay | Admitting: Neurology

## 2013-04-29 ENCOUNTER — Ambulatory Visit (INDEPENDENT_AMBULATORY_CARE_PROVIDER_SITE_OTHER): Payer: BC Managed Care – PPO | Admitting: Neurology

## 2013-04-29 VITALS — BP 118/74 | HR 59 | Temp 97.5°F | Ht 66.5 in | Wt 170.0 lb

## 2013-04-29 DIAGNOSIS — G3184 Mild cognitive impairment, so stated: Secondary | ICD-10-CM | POA: Insufficient documentation

## 2013-04-29 DIAGNOSIS — R413 Other amnesia: Secondary | ICD-10-CM

## 2013-04-29 MED ORDER — DONEPEZIL HCL 5 MG PO TABS: 5.0000 mg | ORAL_TABLET | Freq: Every day | ORAL | Status: DC

## 2013-04-29 NOTE — Progress Notes (Signed)
[facility]  Provider:  [name] Referring Provider: [name]* Primary Care Physician:  [name], MD    HPI: Dr.   [name] is a 62 y.o. female here as a referral from Dr. Sondra Come FP , for evaluation of memory loss.   This patient has been  followed by [name] at the [facility] and is now with Dr Tenny Craw. . She reports that she has been having increasing problems with recalling appointments and ask. She is especially troubled with short-term recall for a Mini-Mental Status Examinatione assured her 30 of 30 points, but the  [facility] exam  Only at 25/30 points.  The 5 points in this were all in immediate recall of 5 words.   The patient reports that she wants forgot some her dog to be picked out the, although she had set out to do exactly that task- instead she went home and only the next morning remembered . She reports that at times she has forgotten her she has to gone next ,and thus a small  task is more difficult now. She also states that there seems to be in no difference in forgetting important results nonimportant details. The patient lives alone. She is normally making a list of daily tasks and reminders and we did before bedtime. She reports one disturbing event when she was interrupted in the middle of unloading her car off groceries and only during the night found that there was a light burning in the garage. Into further investigated she found the trunk of the car still open with unpacked bags of groceries and the garage itself was open.  24 months ago the patient first seek the help of a neurologist and was at that time diagnosed as a mild cognitive impairment. She was seen by Dr. [name]  in [facility] and underwent a neuropsychological testing battery. You Dr. [name] is an MRI which showed a normal enhanced brain and Dr. [name] mentioned in her neuropsychological testing battery that the patient go underwent several  metabolic panels and 2011 including a vitamin D check a thyroid check and in the cell evaluation. I would like to add that the patient has no history of substance abuse and has a very benign past medical history that she has continued to take a healthy diet.   I reviewed the MRI report from 2011 that was obtained at [facility] as well as the neuropsychological evaluation itself. The patient appears not to be depressed has nor history of hallucinations and no suicidal ideation. She is friendly and cooperative. 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  Review of Systems: Out of a complete 14 system review, the patient complains of only the following symptoms, and all other reviewed systems are negative.   History   Social History  . Marital Status: Single    Spouse Name: N/A    Number of Children: 0  . Years of Education: N/A   Occupational History  . PROFESSOR    Social History Main Topics  . Smoking status: Never Smoker   . Smokeless tobacco: Never Used  . Alcohol Use: No  . Drug Use: No  . Sexually Active: No   Other Topics Concern  . Not on file   Social History Narrative  . No narrative on file    Family History  Problem Relation Age of Onset  . Colon cancer Neg Hx   . Skin cancer Father   .  Skin cancer Brother   . Skin cancer Paternal Grandfather   . Colon polyps Brother   . Colitis Mother     Past Medical History  Diagnosis Date  . PAC (premature atrial contraction)   . GERD (gastroesophageal reflux disease)   . Cancer     basal cell ca removed from arm  . Complaints of memory disturbance     evaluated by Dr  [name]  . Ankle fracture, left 2005  . Esophageal stricture   . Esophagitis     Past Surgical History  Procedure Laterality Date  . Anterior fusion cervical spine  2000  . Abdominal hysterectomy  1998  . Knee arthroscopy w/ lateral release    . Knee arthroscopy w/ meniscal repair    . Eye surgery      x 5 right eye  . Corneal  transplant    . Colonoscopy      every 5 years for FH colon polyps  . Anterior cervical decomp/discectomy fusion  12/11/2011    Procedure: ANTERIOR CERVICAL DECOMPRESSION/DISCECTOMY FUSION 2 LEVELS;  Surgeon: Stefani Dama;  Location: [facility];  Service: Neurosurgery;  Laterality: N/A;  C5-6 C6-7 Anterior cervical decompression/diskectomy fusion  . Incontinence surgery      Current Outpatient Prescriptions  Medication Sig Dispense Refill  . CELEBREX 200 MG capsule 200 mg.      . esomeprazole (NEXIUM) 40 MG capsule Take 1 capsule (40 mg total) by mouth daily.  90 capsule  3  . estradiol (VIVELLE-DOT) 0.1 MG/24HR Place 1 patch onto the skin 2 (two) times a week. ON Wednesday AND SATURDAY       . nebivolol (BYSTOLIC) 5 MG tablet Take 5 mg by mouth daily. EACH MORNING        No current facility-administered medications for this visit.    Allergies as of 04/29/2013 - Review Complete 04/29/2013  Allergen Reaction Noted  . Aspirin  12/18/2010  . Demerol (meperidine hcl)  12/05/2011    Vitals: There were no vitals taken for this visit. Last Weight:  Wt Readings from Last 1 Encounters:  01/06/13 169 lb 12.8 oz (77.021 kg)   Last Height:   Ht Readings from Last 1 Encounters:  01/06/13 5\' 6"  (1.676 m)   Vision Screening:  Left eye with correction .  Right eye with correction  See vitals. .  Physical exam:  General: The patient is awake, alert and appears not in acute distress. The patient is well groomed. Head: Normocephalic, atraumatic. Neck is supple. Mallampati 2, neck circumference:15. Cardiovascular:  Regular rate and rhythm, without  murmurs or carotid bruit, and without distended neck veins. Respiratory: Lungs are clear to auscultation. Skin:  Without evidence of edema, or rash Trunk: patient  has normal posture.  Neurologic exam : The patient is awake and alert, oriented to place and time.  Memory subjective a concern and objective ,too.  MOCA 25 with pointloss in  work d recall.   There is a normal attention span & concentration ability. Speech is fluent without  dysarthria, dysphonia or aphasia. Mood and affect are appropriate.  Cranial nerves: Pupils are  briskly reactive to light. Funduscopic exam without  evidence of pallor but scars in the  right , status post corneal repair. 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  Extraocular movements  in vertical and horizontal planes intact and without nystagmus. Visual fields by finger perimetry are intact. Hearing to finger rub intact.  Facial sensation intact to fine touch. Facial  motor strength is symmetric and tongue and uvula move midline.  Motor exam:   Normal tone and normal muscle bulk , symmetric.  No cog-wheeling, mild action tremor, not at rest. She does status post bilateral fusion of the lumbar spine and has lost some abduction and abduction as well as hip flexion strength.  Remains with  good prip strength.  Sensory:  Fine touch, pinprick and vibration were tested in all extremities. Proprioception is tested in the upper extremities only. This was  normal.  Coordination: Rapid alternating movements in the fingers/hands is tested and normal. Finger-to-nose maneuver tested and normal without evidence of ataxia, dysmetria or tremor.  Gait and station: Patient walks without assistive device and is able and assisted stool climb up to the exam table. Stance is stable and normal. Tandem gait is unsteady- unfragmented. Romberg testing is normal.  Deep tendon reflexes: in the  upper and lower extremities are symmetric, although all attenuated. .    Assessment:  After physical and neurologic examination, review of laboratory studies, imaging, neurophysiology testing and pre-existing records, assessment will be reviewed on the problem list.  Plan:  Treatment plan and additional workup will be reviewed under Problem List.

## 2013-04-29 NOTE — Assessment & Plan Note (Signed)
MMSE 30 over 30, MOCA 25 of 30 points, all loss of immediate recall words. Her neck or description of sudden amnestic spells. Remarkable is that the patient also has some memory loss for childhood events and events from  decades ago. Plan to initiate a mammary support of medication #1 #2 EEG to evaluate for brain wave abnormalities, #3 lab panel also known as dementia panel. At  this time I do not plan to repeat the MRI from 2 and  a half years ago and there are no focal deficits noted.

## 2013-04-29 NOTE — Patient Instructions (Signed)
Place dementia patient instructions here.   #1 keep daily physical activities. #2 moderate buried diet #3 engage your brain, continue reading cross puzzles and participate in social events.

## 2013-05-04 ENCOUNTER — Encounter: Payer: Self-pay | Admitting: Neurology

## 2013-05-04 ENCOUNTER — Ambulatory Visit (INDEPENDENT_AMBULATORY_CARE_PROVIDER_SITE_OTHER): Payer: BC Managed Care – PPO | Admitting: Radiology

## 2013-05-04 ENCOUNTER — Other Ambulatory Visit: Payer: Self-pay | Admitting: Neurology

## 2013-05-04 DIAGNOSIS — R413 Other amnesia: Secondary | ICD-10-CM

## 2013-05-04 DIAGNOSIS — G3184 Mild cognitive impairment, so stated: Secondary | ICD-10-CM

## 2013-05-04 NOTE — Procedures (Signed)
Electroencephalogram Procedure Note  Indications: Diagnostic  Medications:  This patient's medications were not listed by the attending technician. Please review of the note on EPIC  for this detail.   Technical Description  This study was performed using digital electroencephalographic recording equipment by [facility]. . International 10-20 electrode placement was used.    Activation Procedures:  This patient was exposed to  Photic stimulation   as well as hyperventilation. EEG Description Awake: Alpha Activity: The waking state record contains a well-defined bi-occipital alpha rhythm of low amplitude with a dominant frequency of 7-8 Hz. Reactivity is normal. The eyes closed the patient showed a posterior dominant rhythm between 7 and 8 Hz. This 40 stimulation that was excessive eye blinking artifact noted.  There were brief periods of sleep seen, marked by vertex sharp waves.  The EKG showed isolated ectopic beats, but remained more mainly in  normal sinus rhythm of 62 Hz.  Interpretation: The brain frequency is borderline slow, but not abnormal at this point. This EEG does not give evidence of a nor generative disorder.  Oshen Wlodarczyk, MD

## 2013-05-05 ENCOUNTER — Encounter: Payer: Self-pay | Admitting: Neurology

## 2013-05-05 LAB — SEDIMENTATION RATE: Sed Rate: 2 mm/hr (ref 0–40)

## 2013-05-05 LAB — CBC
Hemoglobin: 13.3 g/dL (ref 11.1–15.9)
RBC: 4.47 x10E6/uL (ref 3.77–5.28)

## 2013-05-05 LAB — VITAMIN B12: Vitamin B-12: 533 pg/mL (ref 211–946)

## 2013-05-05 LAB — FOLATE: Folate: 5.9 ng/mL (ref >3.0)

## 2013-05-05 LAB — TSH: TSH: 3.03 u[IU]/mL (ref 0.450–4.500)

## 2013-05-27 ENCOUNTER — Encounter: Payer: Self-pay | Admitting: Neurology

## 2013-07-29 ENCOUNTER — Ambulatory Visit: Payer: BC Managed Care – PPO | Admitting: Neurology

## 2013-12-31 HISTORY — PX: KNEE ARTHROSCOPY: SUR90

## 2014-01-25 ENCOUNTER — Other Ambulatory Visit: Payer: Self-pay | Admitting: Internal Medicine

## 2014-08-23 ENCOUNTER — Other Ambulatory Visit: Payer: Self-pay | Admitting: Neurological Surgery

## 2014-08-23 DIAGNOSIS — M5000 Cervical disc disorder with myelopathy, unspecified cervical region: Secondary | ICD-10-CM

## 2014-09-04 ENCOUNTER — Ambulatory Visit
Admission: RE | Admit: 2014-09-04 | Discharge: 2014-09-04 | Disposition: A | Discharge status: Home / Self Care | Payer: BC Managed Care – PPO | Source: Ambulatory Visit | Attending: Neurological Surgery | Admitting: Neurological Surgery

## 2014-09-04 DIAGNOSIS — M5000 Cervical disc disorder with myelopathy, unspecified cervical region: Secondary | ICD-10-CM

## 2014-10-26 ENCOUNTER — Other Ambulatory Visit: Payer: Self-pay | Admitting: Neurological Surgery

## 2014-10-26 DIAGNOSIS — M5412 Radiculopathy, cervical region: Secondary | ICD-10-CM

## 2014-11-05 ENCOUNTER — Ambulatory Visit
Admission: RE | Admit: 2014-11-05 | Discharge: 2014-11-05 | Disposition: A | Discharge status: Home / Self Care | Payer: BC Managed Care – PPO | Source: Ambulatory Visit | Attending: Neurological Surgery | Admitting: Neurological Surgery

## 2014-11-05 DIAGNOSIS — M5412 Radiculopathy, cervical region: Secondary | ICD-10-CM

## 2015-05-24 ENCOUNTER — Other Ambulatory Visit: Payer: Self-pay | Admitting: Obstetrics and Gynecology

## 2015-05-25 LAB — CYTOLOGY - PAP

## 2016-05-14 ENCOUNTER — Ambulatory Visit (INDEPENDENT_AMBULATORY_CARE_PROVIDER_SITE_OTHER): Payer: BC Managed Care – PPO | Admitting: Gastroenterology

## 2016-05-14 ENCOUNTER — Encounter: Payer: Self-pay | Admitting: Gastroenterology

## 2016-05-14 VITALS — BP 100/70 | HR 72 | Ht 64.75 in | Wt 173.1 lb

## 2016-05-14 DIAGNOSIS — R079 Chest pain, unspecified: Secondary | ICD-10-CM | POA: Diagnosis not present

## 2016-05-14 DIAGNOSIS — R1310 Dysphagia, unspecified: Secondary | ICD-10-CM

## 2016-05-14 NOTE — Patient Instructions (Signed)
If you are age 63 or older, your body mass index should be between 23-30. Your Body mass index is 29.02 kg/(m^2). If this is out of the aforementioned range listed, please consider follow up with your Primary Care Provider.  If you are age 9 or younger, your body mass index should be between 19-25. Your Body mass index is 29.02 kg/(m^2). If this is out of the aformentioned range listed, please consider follow up with your Primary Care Provider.   You have been scheduled for an endoscopy. Please follow written instructions given to you at your visit today. If you use inhalers (even only as needed), please bring them with you on the day of your procedure. Your physician has requested that you go to www.startemmi.com and enter the access code given to you at your visit today. This web site gives a general overview about your procedure. However, you should still follow specific instructions given to you by our office regarding your preparation for the procedure.   Thank you for choosing [facility]  Dr Havery Moros

## 2016-05-14 NOTE — Progress Notes (Signed)
HPI :  65 y/o female, former patient of Dr. Olevia Perches and new to me, here for evaluation of chest symptoms.   She reports for the past 3 weeks she has had some discomfort in her chest. She feels it in her substernal area, lower half. The discomfort has been there all the time, rated anywhere from 2--8/10. She has not been pain free since it started. No shortness of breath. Good exertional capacity, symptoms are not reproduced or worsened with exertion. No nausea or vomiting. If she doesn't eat, it will make it feel worse. She reports having prior "esophageal spasms", which she has had remotely, and thinks this feels like these symptoms. If she eats ice cream and frozen yogurt will provide some temporary benefit. She has been trying Mylanta and TUMS which helped make it go from a 6/10 to 2/10 at times, but sometimes no improvement. She has been taking 40mg  nexium once daily. She has been on carafate for the past week without significant relief. She does endorse some odynophagia when she swallows. She is having dysphagia in her lower chest as well. She is having dysphagia to only solids. Liquids cause worsening odynophagia and worsens her pain.  No fevers. Prior to the last 3 weeks she has had no problems swallowing. She has been on nexium for along time, typical reflux symptoms are usually pyrosis. She thinks this is general has also worsened recently. She otherwise endorses in January she had acute onset chest pain, seen in the ER for 6 hours with no evidence of cardiac ischemia. She has had a prior negative stress test of her heart, she thinks last year by Dr. Sinclair Grooms.    No FH of colon cancer, brother has had colon polyps. Due for recall colonoscopy in 2022  Colonoscopy 2012 - on diminutive polyp, no adenoma, benign EGD 2007 - small hiatal hernia, mild esophagitis / gastritis  Past Medical History  Diagnosis Date  . PAC (premature atrial contraction)   . GERD (gastroesophageal reflux disease)   .  Cancer ([place])     basal cell ca removed from arm  . Complaints of memory disturbance     evaluated by Dr  Marijean Bravo  . Ankle fracture, left 2005  . Esophageal stricture   . Esophagitis      Past Surgical History  Procedure Laterality Date  . Anterior fusion cervical spine  2000  . Abdominal hysterectomy  1998  . Knee arthroscopy w/ lateral release Right   . Knee arthroscopy w/ meniscal repair Left   . Eye surgery      x 5 right eye  . Corneal transplant    . Colonoscopy      every 5 years for FH colon polyps  . Anterior cervical decomp/discectomy fusion  12/11/2011    Procedure: ANTERIOR CERVICAL DECOMPRESSION/DISCECTOMY FUSION 2 LEVELS;  Surgeon: Earleen Newport;  Location: [facility];  Service: Neurosurgery;  Laterality: N/A;  C5-6 C6-7 Anterior cervical decompression/diskectomy fusion  . Incontinence surgery    . Knee arthroscopy Right 2015   Family History  Problem Relation Age of Onset  . Colon cancer Neg Hx   . Skin cancer Father   . Skin cancer Brother   . Skin cancer Paternal Grandfather   . Colon polyps Brother   . Colitis Mother    Social History  Substance Use Topics  . Smoking status: Never Smoker   . Smokeless tobacco: Never Used  . Alcohol Use: No   Current Outpatient Prescriptions  Medication  Sig Dispense Refill  . atenolol (TENORMIN) 25 MG tablet Take 25 mg by mouth daily.    . Cholecalciferol (VITAMIN D) 2000 units CAPS Take 1 capsule by mouth daily.    Marland Kitchen donepezil (ARICEPT) 5 MG tablet Take 1 tablet (5 mg total) by mouth at bedtime. 30 tablet 3  . estradiol (VIVELLE-DOT) 0.1 MG/24HR Place 1 patch onto the skin 2 (two) times a week. ON Wednesday AND SATURDAY     . meloxicam (MOBIC) 15 MG tablet Take 15 mg by mouth daily.    Marland Kitchen NEXIUM 40 MG capsule TAKE 1 CAPSULE BY MOUTH DAILY 90 capsule 0   No current facility-administered medications for this visit.   Allergies  Allergen Reactions  . Aspirin     REACTION: retinal hemorhage  . Demerol [Meperidine  Hcl]     REACTION: hallucinations     Review of Systems: All systems reviewed and negative except where noted in HPI.   Lab Results  Component Value Date   WBC 8.2 05/04/2013   HGB 13.3 05/04/2013   HCT 41.5 05/04/2013   MCV 93 05/04/2013   PLT 253 05/04/2013    Lab Results  Component Value Date   CREATININE 0.71 12/07/2011   BUN 17 12/07/2011   NA 137 12/07/2011   K 3.7 12/07/2011   CL 100 12/07/2011   CO2 29 12/07/2011    No results found for: ALT, AST, GGT, ALKPHOS, BILITOT   Physical Exam: BP 100/70 mmHg  Pulse 72  Ht 5' 4.75" (1.645 m)  Wt 173 lb 2 oz (78.529 kg)  BMI 29.02 kg/m2 Constitutional: Pleasant,well-developed, female in no acute distress. HEENT: Normocephalic and atraumatic. Conjunctivae are normal. No scleral icterus. Neck supple.  Cardiovascular: Normal rate, regular rhythm.  Pulmonary/chest: Effort normal and breath sounds normal. No wheezing, rales or rhonchi. Abdominal: Soft, nondistended, nontender. Bowel sounds active throughout. There are no masses palpable. No hepatomegaly. Extremities: no edema Lymphadenopathy: No cervical adenopathy noted. Neurological: Alert and oriented to person place and time. Skin: Skin is warm and dry. No rashes noted. Psychiatric: Normal mood and affect. Behavior is normal.   ASSESSMENT AND PLAN: 65 y/o female with history as outlined above, presenting with 3 weeks of constant chest discomfort associated with odynophagia and dysphagia. Prior stress test reportedly done within the past year and negative although I don't see reports of this in Epic. None the less her symptoms appear to be less likely cardiac based on her history as above and there is a clear association with eating. I am recommending an EGD to be done to evaluate her symptoms. The indications, risks, and benefits of EGD were explained to the patient in detail. Risks include but are not limited to bleeding, perforation, adverse reaction to medications,  and cardiopulmonary compromise. Sequelae include but are not limited to the possibility of surgery, hospitalization, and mortality. The patient verbalized understanding and wished to proceed. All questions answered, referred to scheduler, to be done tomorrow. Further recommendations pending results of the exam. In the interim she should take her nexium twice daily. If EGD does not reveal an etiology and symptoms of odynophagia / dysphagia persist, will consider esophageal manometry. She agreed.   Frannie Cellar, MD [facility] Pager [phone]  CC: Leighton Ruff, MD

## 2016-05-15 ENCOUNTER — Ambulatory Visit (AMBULATORY_SURGERY_CENTER): Payer: BC Managed Care – PPO | Admitting: Gastroenterology

## 2016-05-15 ENCOUNTER — Encounter: Payer: Self-pay | Admitting: Gastroenterology

## 2016-05-15 VITALS — BP 111/66 | HR 64 | Temp 99.8°F | Resp 15 | Ht 64.0 in | Wt 173.0 lb

## 2016-05-15 DIAGNOSIS — R1310 Dysphagia, unspecified: Secondary | ICD-10-CM

## 2016-05-15 DIAGNOSIS — K317 Polyp of stomach and duodenum: Secondary | ICD-10-CM

## 2016-05-15 DIAGNOSIS — R079 Chest pain, unspecified: Secondary | ICD-10-CM | POA: Diagnosis not present

## 2016-05-15 MED ORDER — SODIUM CHLORIDE 0.9 % IV SOLN: 500.0000 mL | INTRAVENOUS | Status: DC

## 2016-05-15 NOTE — Patient Instructions (Signed)
YOU HAD AN ENDOSCOPIC PROCEDURE TODAY AT [facility]:   Refer to the procedure report that was given to you for any specific questions about what was found during the examination.  If the procedure report does not answer your questions, please call your gastroenterologist to clarify.  If you requested that your care partner not be given the details of your procedure findings, then the procedure report has been included in a sealed envelope for you to review at your convenience later.  YOU SHOULD EXPECT: Some feelings of bloating in the abdomen. Passage of more gas than usual.  Walking can help get rid of the air that was put into your GI tract during the procedure and reduce the bloating. If you had a lower endoscopy (such as a colonoscopy or flexible sigmoidoscopy) you may notice spotting of blood in your stool or on the toilet paper. If you underwent a bowel prep for your procedure, you may not have a normal bowel movement for a few days.  Please Note:  You might notice some irritation and congestion in your nose or some drainage.  This is from the oxygen used during your procedure.  There is no need for concern and it should clear up in a day or so.  SYMPTOMS TO REPORT IMMEDIATELY:    Following upper endoscopy (EGD)  Vomiting of blood or coffee ground material  New chest pain or pain under the shoulder blades  Painful or persistently difficult swallowing  New shortness of breath  Fever of 100F or higher  Black, tarry-looking stools  For urgent or emergent issues, a gastroenterologist can be reached at any hour by calling [phone].   DIET: Your first meal following the procedure should be a small meal and then it is ok to progress to your normal diet. Heavy or fried foods are harder to digest and may make you feel nauseous or bloated.  Likewise, meals heavy in dairy and vegetables can increase bloating.  Drink plenty of fluids but you should avoid alcoholic beverages  for 24 hours.  ACTIVITY:  You should plan to take it easy for the rest of today and you should NOT DRIVE or use heavy machinery until tomorrow (because of the sedation medicines used during the test).    FOLLOW UP: Our staff will call the number listed on your records the next business day following your procedure to check on you and address any questions or concerns that you may have regarding the information given to you following your procedure. If we do not reach you, we will leave a message.  However, if you are feeling well and you are not experiencing any problems, there is no need to return our call.  We will assume that you have returned to your regular daily activities without incident.  If any biopsies were taken you will be contacted by phone or by letter within the next 1-3 weeks.  Please call us at [phone] if you have not heard about the biopsies in 3 weeks.    SIGNATURES/CONFIDENTIALITY: You and/or your care partner have signed paperwork which will be entered into your electronic medical record.  These signatures attest to the fact that that the information above on your After Visit Summary has been reviewed and is understood.  Full responsibility of the confidentiality of this discharge information lies with you and/or your care-partner.  Hiatal hernia-handout given  Wait biopsy results.  Manometry to assess for dysmotility.  Chest x-ray for baseline and  follow-up with primary care

## 2016-05-15 NOTE — Progress Notes (Signed)
Called to room to assist during endoscopic procedure.  Patient ID and intended procedure confirmed with present staff. Received instructions for my participation in the procedure from the performing physician.  

## 2016-05-15 NOTE — Progress Notes (Signed)
Report to PACU, RN, vss, BBS= Clear.  

## 2016-05-15 NOTE — Op Note (Signed)
[facility] Patient Name: Laniah Burum Procedure Date: 05/15/2016 10:23 AM MRN: [id number] Endoscopist: Remo Lipps P. Havery Moros , MD Age: 65 Referring MD:  Date of Birth: October 12, 1951 Gender: Female Procedure:                Upper GI endoscopy Indications:              Dysphagia, Odynophagia, Unexplained chest pain Medicines:                Monitored Anesthesia Care Procedure:                Pre-Anesthesia Assessment:                           - Prior to the procedure, a History and Physical                            was performed, and patient medications and                            allergies were reviewed. [name] tolerance of                            previous anesthesia was also reviewed. The risks                            and benefits of the procedure and the sedation                            options and risks were discussed with the patient.                            All questions were answered, and informed consent                            was obtained. Prior Anticoagulants: The patient has                            taken no previous anticoagulant or antiplatelet                            agents. ASA Grade Assessment: II - A patient with                            mild systemic disease. After reviewing the risks                            and benefits, the patient was deemed in                            satisfactory condition to undergo the procedure.                           After obtaining informed consent, the endoscope was  passed under direct vision. Throughout the                            procedure, the patient's blood pressure, pulse, and                            oxygen saturations were monitored continuously. The                            Model GIF-[id number] [phone]) scope was introduced                            through the mouth, and advanced to the second part                            of duodenum. The upper  GI endoscopy was                            accomplished without difficulty. The patient                            tolerated the procedure well. Scope In: Scope Out: Findings:                 Esophagogastric landmarks were identified: the                            Z-line was found at 36 cm, the gastroesophageal                            junction was found at 36 cm and the upper extent of                            the gastric folds was found at 37 cm from the                            incisors.                           A 1 cm hiatal hernia was present.                           The exam of the esophagus was otherwise normal.                           Biopsies were taken with a cold forceps in the                            middle third of the esophagus and in the lower                            third of the esophagus for histology.  Multiple 3 to 10 mm pedunculated and sessile polyps                            with no stigmata of recent bleeding were found in                            the gastric fundus and in the gastric body. The 2                            largest polyps were removed as a representative                            sample with a hot snare. Resection and retrieval                            were complete. I suspect gastric polyps are benign                            fundic gland polyps.                           The exam of the stomach was otherwise normal.                           The duodenal bulb and second portion of the                            duodenum were normal. Complications:            No immediate complications. Estimated blood loss:                            Minimal. Estimated Blood Loss:     Estimated blood loss was minimal. Impression:               - Esophagogastric landmarks identified.                           - 1 cm hiatal hernia.                           - Otherwise normal esophagus, no pathology to                             account for symptoms, biopsies taken to rule out                            EoE.                           - Multiple gastric polyps. Resected and retrieved.                           - Normal duodenal bulb and second portion of the  duodenum.                           - Biopsies were taken with a cold forceps for                            histology in the middle third of the esophagus and                            in the lower third of the esophagus. Recommendation:           - Patient has a contact number available for                            emergencies. The signs and symptoms of potential                            delayed complications were discussed with the                            patient. Return to normal activities tomorrow.                            Written discharge instructions were provided to the                            patient.                           - Resume previous diet.                           - Continue present medications.                           - Await pathology results.                           - Recommend esophageal manometry to assess for                            dysmotility given ongoing symptoms                           - I would recommend a baseline CXR, and follow up                            with primary care to exclude other causes of chest                            pain should GI workup be unremarkable Remo Lipps P. Kassondra Geil, MD 05/15/2016 10:49:16 AM This report has been signed electronically.

## 2016-05-16 ENCOUNTER — Telehealth: Payer: Self-pay | Admitting: *Deleted

## 2016-05-16 NOTE — Telephone Encounter (Signed)
  Follow up Call-  Call back number 05/15/2016  Post procedure Call Back phone  # [phone]  Permission to leave phone message No     Patient questions:  Do you have a fever, pain , or abdominal swelling? Yes.   Pain Score  2 *  Have you tolerated food without any problems? Yes.    Have you been able to return to your normal activities? Yes.    Do you have any questions about your discharge instructions: Diet   No. Medications  No. Follow up visit  No.  Do you have questions or concerns about your Care? No.  Actions: * If pain score is 4 or above: No action needed, pain <4. Patient stating she did have pain last evening, was able to eat and sleep last evening. Patient will call PCP in this am for recommendations, possible chest xray.

## 2016-05-17 ENCOUNTER — Encounter: Payer: Self-pay | Admitting: *Deleted

## 2016-05-17 ENCOUNTER — Telehealth: Payer: Self-pay | Admitting: Gastroenterology

## 2016-05-17 DIAGNOSIS — K219 Gastro-esophageal reflux disease without esophagitis: Secondary | ICD-10-CM

## 2016-05-17 NOTE — Telephone Encounter (Signed)
Scheduled esophageal manometry on 05/25/16 at 8:30 AM at [facility] end[facility]. Left a message for patient to call back.

## 2016-05-17 NOTE — Telephone Encounter (Signed)
Left a message for patient to call back. 

## 2016-05-17 NOTE — Telephone Encounter (Signed)
Patient given appointment date, time and instructions. Mailed instructions to patient also.

## 2016-05-25 ENCOUNTER — Encounter (HOSPITAL_COMMUNITY): Payer: Self-pay | Admitting: *Deleted

## 2016-05-25 ENCOUNTER — Ambulatory Visit (HOSPITAL_COMMUNITY)
Admission: RE | Admit: 2016-05-25 | Discharge: 2016-05-25 | Disposition: A | Discharge status: Home / Self Care | Payer: BC Managed Care – PPO | Source: Ambulatory Visit | Attending: Gastroenterology | Admitting: Gastroenterology

## 2016-05-25 ENCOUNTER — Encounter (HOSPITAL_COMMUNITY)
Admission: RE | Disposition: A | Discharge status: Home / Self Care | Payer: Self-pay | Source: Ambulatory Visit | Attending: Gastroenterology

## 2016-05-25 DIAGNOSIS — R079 Chest pain, unspecified: Secondary | ICD-10-CM | POA: Insufficient documentation

## 2016-05-25 DIAGNOSIS — R0789 Other chest pain: Secondary | ICD-10-CM | POA: Diagnosis not present

## 2016-05-25 HISTORY — PX: ESOPHAGEAL MANOMETRY: SHX5429

## 2016-05-25 SURGERY — MANOMETRY, ESOPHAGUS

## 2016-05-25 MED ORDER — LIDOCAINE VISCOUS 2 % MT SOLN
OROMUCOSAL | Status: AC
  Filled 2016-05-25: qty 15

## 2016-05-25 SURGICAL SUPPLY — 2 items
FACESHIELD LNG OPTICON STERILE (SAFETY) IMPLANT
GLOVE BIO SURGEON STRL SZ8 (GLOVE) ×6 IMPLANT

## 2016-05-25 NOTE — Progress Notes (Signed)
After review of allergies, medications, medical history, and symptoms esophageal manometry was performed per protocol.  Patient tolerated well.  Results will be sent to Dr. Lidder[place] Cellar today.

## 2016-05-28 ENCOUNTER — Encounter (HOSPITAL_COMMUNITY): Payer: Self-pay | Admitting: Gastroenterology

## 2016-05-29 ENCOUNTER — Ambulatory Visit (INDEPENDENT_AMBULATORY_CARE_PROVIDER_SITE_OTHER): Payer: BC Managed Care – PPO | Admitting: Neurology

## 2016-05-29 ENCOUNTER — Telehealth: Payer: Self-pay | Admitting: Gastroenterology

## 2016-05-29 ENCOUNTER — Encounter (HOSPITAL_COMMUNITY): Payer: Self-pay | Admitting: Gastroenterology

## 2016-05-29 VITALS — BP 100/70 | HR 86 | Resp 20 | Ht 64.0 in | Wt 166.0 lb

## 2016-05-29 DIAGNOSIS — G3184 Mild cognitive impairment, so stated: Secondary | ICD-10-CM

## 2016-05-29 DIAGNOSIS — R7989 Other specified abnormal findings of blood chemistry: Secondary | ICD-10-CM

## 2016-05-29 NOTE — Telephone Encounter (Signed)
I have asked Dr. Silverio Decamp to assist me in interpreting this particular study, as soon as I have the results she will be contacted with it. Thanks

## 2016-05-29 NOTE — Telephone Encounter (Signed)
Spoke with patient and she is going on a big trip Saturday. She is hurting and is hoping she can get results before she goes on her trip.

## 2016-05-29 NOTE — Progress Notes (Signed)
NEUROLOGY  CLINIC   Provider:  Larey Seat, M D  Referring Provider: Primary Care Physician:   Melinda Crutch, MD  Chief Complaint  Patient presents with  . New Patient (Initial Visit)    memory problems, rm 10, alone    HPI:  [name] Stone is a 65 y.o. female , seen here as a referral from Dr. Harrington Challenger ,  Chief complaint according to patient :  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  From the beginning of her career she was a patient of Dr. Norberto Sorenson formerly and [place] primary care physician. She has now switched to Dr. Harrington Challenger. She is here to be evaluated again for memory loss. I saw the patient a little over 3 years ago with the same chief complaint, but by now she feels that it appears her work. Students have quoted her saying something and previous lectures that she cannot recall. And she has a borderline score on today's [place] cognitive assessment. She scored 25 out of 30 points and given her higher level of education she should have score 26 and above. She has great trouble with immediate recall for example a phone number that she has to repeatedly say aloud -or better write down- to be able to completely dial it. She also has been getting lost when she drives and has to reorient herself to the surroundings. It is not necessarily that she does not know where she is -that she does not recall where she is heading to.She lives off [address].  In early spring 2014 the patient had presented with an MRI performed outside at [facility], she had a neuropsychological battery for memory loss performed at [facility] and all I did at that time was to repeat some of the tests and an EEG. The EEG was borderline slow but still in the normal  range for age. Dr Marijean Bravo was her Neurologist, [place].  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 She lives alone. Lost her dog in October. Mother lives at age 47 near by.  Non smoker, no ETOH, caffeine- 2 cups of coffee, stopped 3 weeks ago for GERD.   Review of Systems: Out of a complete 14 system review, the patient complains of only the following symptoms, and all other reviewed systems are negative.  The patient reports difficulty swallowing she often has the feeling that food is stuck in her esophagus and she has pain and heartburn. She just underwent an upper GI endoscopy which showed no abnormality and a manometric pressure of the esophagus was measured she has not seen the result of this yet. She also reports joint pain, somewhat less interest in activities, memory loss. She underwent a also needed knee surgery in July 2015, arthroscopic. She had bilateral spinal fusion surgery December 2014, after my last visit with her.   Social History   Social History  . Marital Status: Single    Spouse Name: N/A  . Number of Children: 0  . Years of Education: N/A   Occupational History  . PROFESSOR    Social History Main Topics  .  Smoking status: Never Smoker   . Smokeless tobacco: Never Used  . Alcohol Use: No  . Drug Use: No  . Sexual Activity: No   Other Topics Concern  . Not on file   Social History Narrative    Family History  Problem Relation Age of Onset  . Colon cancer Neg Hx   . Skin cancer Father   . Skin cancer Brother   . Skin cancer Paternal Grandfather   . Colon polyps Brother   . Colitis Mother     Past Medical History  Diagnosis Date  . PAC (premature atrial contraction)   . GERD (gastroesophageal reflux disease)   . Cancer ([place])     basal cell ca removed from arm  . Complaints of memory disturbance      evaluated by Dr  Marijean Bravo  . Ankle fracture, left 2005  . Esophageal stricture   . Esophagitis     Past Surgical History  Procedure Laterality Date  . Anterior fusion cervical spine  2000  . Abdominal hysterectomy  1998  . Knee arthroscopy w/ lateral release Right   . Knee arthroscopy w/ meniscal repair Left   . Eye surgery      x 5 right eye  . Corneal transplant    . Colonoscopy      every 5 years for FH colon polyps  . Anterior cervical decomp/discectomy fusion  12/11/2011    Procedure: ANTERIOR CERVICAL DECOMPRESSION/DISCECTOMY FUSION 2 LEVELS;  Surgeon: Earleen Newport;  Location: [facility];  Service: Neurosurgery;  Laterality: N/A;  C5-6 C6-7 Anterior cervical decompression/diskectomy fusion  . Incontinence surgery    . Knee arthroscopy Right 2015  . Esophageal manometry N/A 05/25/2016    Procedure: ESOPHAGEAL MANOMETRY (EM);  Surgeon: Manus Gunning, MD;  Location: [facility];  Service: Gastroenterology;  Laterality: N/A;    Current Outpatient Prescriptions  Medication Sig Dispense Refill  . atenolol (TENORMIN) 25 MG tablet Take 25 mg by mouth daily.    . Cholecalciferol (VITAMIN D) 2000 units CAPS Take 1 capsule by mouth daily.    Marland Kitchen estradiol (VIVELLE-DOT) 0.1 MG/24HR Place 1 patch onto the skin 2 (two) times a week. ON Wednesday AND SATURDAY     . meloxicam (MOBIC) 15 MG tablet Take 15 mg by mouth daily.    Marland Kitchen NEXIUM 40 MG capsule TAKE 1 CAPSULE BY MOUTH DAILY 90 capsule 0   No current facility-administered medications for this visit.    Allergies as of 05/29/2016 - Review Complete 05/29/2016  Allergen Reaction Noted  . Aspirin  12/18/2010  . Demerol [meperidine hcl]  12/05/2011    Vitals: BP 100/70 mmHg  Pulse 86  Resp 20  Ht 5\' 4"  (1.626 m)  Wt 166 lb (75.297 kg)  BMI 28.48 kg/m2 Last Weight:  Wt Readings from Last 1 Encounters:  05/29/16 166 lb (75.297 kg)   [id number] mass index is 28.48 kg/(m^2).     Last Height:   Ht Readings from Last 1  Encounters:  05/29/16 5\' 4"  (1.626 m)    Physical exam:  General: The patient is awake, alert and appears not in acute distress. The patient is well groomed. Head: Normocephalic, atraumatic. Neck is supple.   Cardiovascular:  Regular rate and rhythm, without  murmurs or carotid bruit, and without distended neck veins. Respiratory: Lungs are clear to auscultation. Skin:  Without evidence of edema, or rash Trunk:   Neurologic exam : The patient is awake and alert, oriented to place and time.  Memory subjective described as impaired  Memory testing revealed   [place]: Montreal Cognitive Assessment  05/29/2016  Visuospatial/ Executive (0/5) 5  Naming (0/3) 2  Attention: Read list of digits (0/2) 2  Attention: Read list of letters (0/1) 1  Attention: Serial 7 subtraction starting at 100 (0/3) 3  Language: Repeat phrase (0/2) 2  Language : Fluency (0/1) 1  Abstraction (0/2) 2  Delayed Recall (0/5) 1  Orientation (0/6) 6  Total 25  Adjusted Score (based on education) 25   MMSE:No flowsheet data found.     Attention span & concentration ability appears normal.  Speech is fluent,  without dysarthria, dysphonia or aphasia.  Mood and affect are appropriate.  Cranial nerves: Pupils are equal and briskly reactive to light. Funduscopic exam without  evidence of pallor or edema. Extraocular movements  in vertical and horizontal planes intact and without nystagmus. Visual fields by finger perimetry are intact. Hearing to finger rub intact. Facial sensation intact to fine touch. Facial motor strength is symmetric and tongue and uvula move midline. Shoulder shrug was symmetrical.   Motor exam: Normal tone, muscle bulk and symmetric strength in all extremities. Sensory:  Fine touch, pinprick and vibration were tested in all extremities. Proprioception tested in the upper extremities was normal. Coordination: Rapid alternating movements / Finger-to-nose maneuver  normal without evidence of  ataxia, dysmetria or tremor.  Gait and station: Patient walks without assistive device and is able unassisted to climb up to the exam table. Strength within normal limits.  Stance is stable and normal.  Tandem gait is unfragmented. Turns with 3 Steps. Romberg testing is negative.  Deep tendon reflexes: in the  upper and lower extremities are symmetric and intact. Babinski maneuver response is downgoing.  The patient was advised of the nature of the diagnosed memory disorder , the treatment options and risks for general a health and wellness arising from not treating the condition.  I spent more than 45 minutes of face to face time with the patient. Greater than 50% of time was spent in counseling and coordination of care. We have discussed the diagnosis and differential and I answered the patient's questions.     Assessment:  After physical and neurologic examination, review of laboratory studies,  Personal review of imaging studies, reports of other /same  Imaging studies ,  Results of  neurophysiology testing and pre-existing records as far as provided in visit., my assessment is   1) [name] Stone is a slightly everted right foot when she walks but she does not have tendon problems is no drifting noticed and she turns with 3 steps. She has normal arm swing, no cogwheeling normal shoulder shrug and mobility overall. There is no evidence of a parkinsonism. There is no evidence to suspect normal pressure hydrocephalus and there have been no hallucinations or visual delusions reported that would lead to other dementia forms.  2) at this time the patient is still in the category of mild cognitive impairment. She is fully oriented she did very well with visual spatial testing executive function she misnamed 1 of 3 animals, but she had significant difficulties with the immediate or delayed recall of words. This also fits which she reports about telephone numbers. She cannot keep them in the mind long enough  before she can dial.   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 The patient was treated briefly with Aricept over 3 years ago but could not tolerate the medication very well not mainly  because of GI symptoms- felt swimmy headed.  Plan:  Treatment plan and additional workup : Her cognitive concerns have been present for over 5 years now. Since her MRI was from 2012 regarding brain MRI I will repeat this. I will also consider her for a memory loss study. I will repeat the EEG. Laboratory tests have been routinely done with Dr. Harrington Challenger and have not shown any metabolic abnormalities.   Rv in 6-8 weeks.   Asencion Partridge Kairee Kozma MD  05/29/2016   CC: Myriam Jacobson, MD

## 2016-05-29 NOTE — Patient Instructions (Signed)
Rv in 6 weeks, for [facility] Memory Compensation Strategies  1. Use "WARM" strategy.  W= write it down  A= associate it  R= repeat it  M= make a mental note  2.   You can keep a Social worker.  Use a 3-ring notebook with sections for the following: calendar, important names and phone numbers,  medications, doctors' names/phone numbers, lists/reminders, and a section to journal what you did  each day.   3.    Use a calendar to write appointments down.  4.    Write yourself a schedule for the day.  This can be placed on the calendar or in a separate section of the Memory Notebook.  Keeping a  regular schedule can help memory.  5.    Use medication organizer with sections for each day or morning/evening pills.  You may need help loading it  6.    Keep a basket, or pegboard by the door.  Place items that you need to take out with you in the basket or on the pegboard.  You may also want to  include a message board for reminders.  7.    Use sticky notes.  Place sticky notes with reminders in a place where the task is performed.  For example: " turn off the  stove" placed by the stove, "lock the door" placed on the door at eye level, " take your medications" on  the bathroom mirror or by the place where you normally take your medications.  8.    Use alarms/timers.  Use while cooking to remind yourself to check on food or as a reminder to take your medicine, or as a  reminder to make a call, or as a reminder to perform another task, etc. Mild Neurocognitive Disorder Mild neurocognitive disorder (formerly known as mild cognitive impairment) is a mental disorder. It is a slight abnormal decrease in mental function. The areas of mental function affected may include memory, thought, communication, behavior, and completion of tasks. The decrease is noticeable and measurable but for the most part does not interfere with your daily activities. Mild neurocognitive disorder typically occurs in people older  than 60 years but can occur earlier. It is not as serious as major neurocognitive disorder (formerly known as dementia) but may lead to a more serious neurocognitive disorder. However, in some cases the condition does not get worse. A few people with this disorder even improve. CAUSES  There are a number of different causes of mild neurocognitive disorder:   Brain disorders associated with abnormal protein deposits, such as Alzheimer's disease, Pick's disease, and Lewy body disease.  Brain disorders associated with abnormal movement, such as Parkinson's disease and Huntington's disease.  Diseases affecting blood vessels in the brain and resulting in mini-strokes.  Certain infections, such as human immunodeficiency virus (HIV) infection.  Traumatic brain injury.  Other medical conditions such as brain tumors, underactive thyroid (hypothyroidism), and vitamin B12 deficiency.  Use of certain prescription medicine and "recreational" drugs. SYMPTOMS  Symptoms of mild neurocognitive disorder include: 2. Difficulty remembering. You may forget details of recent events, names, or phone numbers. You may forget important social events and appointments or repeatedly forget where you put your car keys. 3. Difficulty thinking and solving problems. You may have trouble with complex tasks such as paying bills or driving in unfamiliar locations. 4. Difficulty communicating. You may have trouble finding the right word, naming an object, forming a sentence that makes sense, or understanding what you read or  hear. 5. Changes in your behavior or personality. You may lose interest in the things that you used to enjoy or withdraw from social situations. You may get angry more easily than usual. You may act before thinking. You may do things in public that you would not usually do. You may hear or see things that are not real (hallucinations). You may believe falsely that others are trying to hurt you  (paranoia). DIAGNOSIS Mild neurocognitive disorder is diagnosed through an assessment by your health care provider. Your health care provider will ask you and your family, friends, or coworkers questions about your symptoms. He or she will ask how often the symptoms occur, how long they have been occurring, whether they are getting worse, and the effect they are having on your life. Your health care provider may refer you to a neurologist or mental health specialist for a detailed evaluation of your mental functions (neuropsychological testing).  To identify the cause of your mild neurocognitive disorder, your health care provider may:  Obtain a detailed medical history.  Ask about alcohol and drug use, including prescription medicine.  Perform a physical exam.  Order blood tests and brain imaging exams. TREATMENT  Mild neurocognitive disorder caused by infections, use of certain medicines or "recreational" drugs, and certain medical conditions may improve with treatment of the condition that is causing the disorder. Mild neurocognitive disorder resulting from other causes generally does not improve and may worsen. In these cases, the goal of treatment is to slow progression of the disorder and help you cope with the loss of mental function. Treatments in these cases include:   Medicine. Medicine helps mainly with memory loss and behavioral symptoms.   Talk therapy. Talk therapy provides education, emotional support, memory aids, and other ways of making up for decreases in mental function.   Lifestyle changes. These include regular exercise, a healthy diet (including essential omega-3 fatty acids), intellectual stimulation, and increased social interaction.   This information is not intended to replace advice given to you by your health care provider. Make sure you discuss any questions you have with your health care provider.   Document Released: 08/19/2013 Document Revised: 01/07/2015  Document Reviewed: 08/19/2013 Elsevier Interactive Patient Education [employer].

## 2016-05-30 ENCOUNTER — Telehealth: Payer: Self-pay

## 2016-05-30 NOTE — Telephone Encounter (Signed)
I spoke to pt and advised her that Dr. Brett Fairy reviewed her labs and call came back within normal limits. Pt verbalized understanding of results. Pt had no questions at this time but was encouraged to call back if questions arise.

## 2016-05-30 NOTE — Telephone Encounter (Signed)
-----   Message from Larey Seat, MD sent at 05/30/2016  5:14 PM EDT ----- Normal CMET and CBC , all in normal limits. CD

## 2016-05-31 LAB — COMPREHENSIVE METABOLIC PANEL
ALK PHOS: 64 IU/L (ref 39–117)
ALT: 15 IU/L (ref 0–32)
AST: 15 IU/L (ref 0–40)
Albumin/Globulin Ratio: 1.7 (ref 1.2–2.2)
Albumin: 4 g/dL (ref 3.6–4.8)
BUN/Creatinine Ratio: 25 (ref 12–28)
BUN: 20 mg/dL (ref 8–27)
Bilirubin Total: 0.4 mg/dL (ref 0.0–1.2)
CO2: 24 mmol/L (ref 18–29)
CREATININE: 0.79 mg/dL (ref 0.57–1.00)
Calcium: 9 mg/dL (ref 8.7–10.3)
Chloride: 103 mmol/L (ref 96–106)
GFR calc Af Amer: 92 mL/min/{1.73_m2} (ref >59)
GFR calc non Af Amer: 80 mL/min/{1.73_m2} (ref >59)
GLOBULIN, TOTAL: 2.4 g/dL (ref 1.5–4.5)
Glucose: 89 mg/dL (ref 65–99)
POTASSIUM: 4.2 mmol/L (ref 3.5–5.2)
SODIUM: 143 mmol/L (ref 134–144)
Total Protein: 6.4 g/dL (ref 6.0–8.5)

## 2016-05-31 LAB — PROTEIN ELECTROPHORESIS, SERUM
A/G RATIO SPE: 1.4 (ref 0.7–1.7)
ALBUMIN ELP: 3.7 g/dL (ref 2.9–4.4)
ALPHA 1: 0.2 g/dL (ref 0.0–0.4)
Alpha 2: 0.8 g/dL (ref 0.4–1.0)
Beta: 0.9 g/dL (ref 0.7–1.3)
Gamma Globulin: 0.8 g/dL (ref 0.4–1.8)
Globulin, Total: 2.7 g/dL (ref 2.2–3.9)

## 2016-06-08 DIAGNOSIS — R0789 Other chest pain: Secondary | ICD-10-CM | POA: Insufficient documentation

## 2016-06-11 ENCOUNTER — Telehealth: Payer: Self-pay | Admitting: Gastroenterology

## 2016-06-11 ENCOUNTER — Ambulatory Visit (INDEPENDENT_AMBULATORY_CARE_PROVIDER_SITE_OTHER): Payer: Self-pay

## 2016-06-11 DIAGNOSIS — Z0289 Encounter for other administrative examinations: Secondary | ICD-10-CM

## 2016-06-11 DIAGNOSIS — G3184 Mild cognitive impairment, so stated: Secondary | ICD-10-CM

## 2016-06-11 DIAGNOSIS — R1310 Dysphagia, unspecified: Secondary | ICD-10-CM

## 2016-06-11 NOTE — Telephone Encounter (Signed)
Patient is requesting manometry results. Please, advise.

## 2016-06-12 NOTE — Telephone Encounter (Signed)
I reviewed the manometry, interpreted by Dr. Silverio Decamp. This was a normal study, normal motility with no evidence of spasm or hypercontractile esophagus to account for her symptoms. EGD was likewise normal.   In the clinic visit I asked her to increase PPI to BID to see if this helped in the interim. I don't see a clear cause for her symptoms based on the workup to date. Are they persisting?

## 2016-06-13 NOTE — Telephone Encounter (Signed)
Left message to call.

## 2016-06-13 NOTE — Telephone Encounter (Signed)
She is advised of her results. Confirmed she is taking Nexium 40 mg BID. No improvement of her symptoms.

## 2016-06-15 ENCOUNTER — Other Ambulatory Visit: Payer: Self-pay | Admitting: Family Medicine

## 2016-06-15 DIAGNOSIS — R109 Unspecified abdominal pain: Secondary | ICD-10-CM

## 2016-06-18 ENCOUNTER — Telehealth: Payer: Self-pay

## 2016-06-18 NOTE — Telephone Encounter (Signed)
-----   Message from Larey Seat, MD sent at 06/15/2016  1:41 PM EDT ----- Dear Prof Jenne Campus,  Your brain MRI was again unremarkable. No atrophy or strokes.

## 2016-06-18 NOTE — Telephone Encounter (Signed)
I spoke to pt and advised her that her MRI brain was unremarkable per Dr. Brett Fairy. Pt verbalized understanding of results. Pt had no questions at this time but was encouraged to call back if questions arise.

## 2016-06-19 NOTE — Telephone Encounter (Signed)
Called patient with results and to reassess her condition but no answer, will call again

## 2016-06-20 MED ORDER — PANTOPRAZOLE SODIUM 40 MG PO TBEC: 40.0000 mg | DELAYED_RELEASE_TABLET | Freq: Two times a day (BID) | ORAL | Status: DC

## 2016-06-20 NOTE — Telephone Encounter (Signed)
Scheduled barium swallow and chest CT on 06/29/16 at 10:30 AM [facility] radiology([facility]) NPO 3 hours prior. Patient given date, time and instructions for tests. She has not taken Protonix. Rx sent to her pharmacy.

## 2016-06-20 NOTE — Telephone Encounter (Signed)
Called patient back and spent 15 minutes with her discussing her case. She continues to have intermittent chest discomfort, can occur both with eating but also occurs without relation to eating and can bother her at night. She reports a fairly recent stress test of her heart which was unremarkable. She denies exertional symptoms. She has had an EGD and manometry without clear etiology, she has not had any benefit from PPIs or carafate to date. She continues to have some intermittent dysphagia and odynophagia. Given lack of erosive changes on EGD and failure of PPI and carafate, I think GERD is less likely but could consider a pH study pending her course.   Given her ongoing symptoms, lack of findings on workup to date or benefit to date, we discussed other possibilities that could account for her chest pain. Rollene Fare, can you please coordinate CT chest with contrast to further evaluate and ensure nothing else going on to cause her pain. I would also like her to have a barium swallow with tablet study to see if we can localize the site of her dysphagia given lack of findings to date. Otherwise she endorses a history of "esophageal spasm" remotely, unclear if she truly had this but was not noted on manometry. Given her odynophagia, if spasm is related, recommended a trial of peppermint altoids with eating and discussed with her how to use this. Pending her workup we may add a TCA.   Rollene Fare can you please coordinate CT chest and barium swallow as soon as possible for this patient, she is quite anxious about her symptoms. thanks

## 2016-06-20 NOTE — Telephone Encounter (Signed)
I forgot to mention this to the patient but would recommend a change in PPI from nexium to another PPI she has not been exposed to, such as protonix, dosed at 40mg  BID to see if this may help. thanks

## 2016-06-25 ENCOUNTER — Encounter: Payer: Self-pay | Admitting: Gastroenterology

## 2016-06-25 ENCOUNTER — Ambulatory Visit
Admission: RE | Admit: 2016-06-25 | Discharge: 2016-06-25 | Disposition: A | Discharge status: Home / Self Care | Payer: BC Managed Care – PPO | Source: Ambulatory Visit | Attending: Family Medicine | Admitting: Family Medicine

## 2016-06-25 DIAGNOSIS — R109 Unspecified abdominal pain: Secondary | ICD-10-CM

## 2016-06-25 NOTE — Telephone Encounter (Signed)
Error

## 2016-06-26 ENCOUNTER — Telehealth: Payer: Self-pay | Admitting: *Deleted

## 2016-06-26 NOTE — Telephone Encounter (Signed)
Thanks [employer]. She will need to see her primary care to determine if the exam is warranted, and they preferred to have a CXR done first. I think she had one but had not seen the result. Thanks

## 2016-06-26 NOTE — Telephone Encounter (Signed)
Per MD could not get CT approved. Needs chest xray first or results from previous. Patient notified. She will talk with her PCP about the chest xray she had in January and see if he will get CT of chest ordered based on this. She understands we will to the barium swallow. She will let us know if she cannot get the CT through her PCP.

## 2016-06-29 ENCOUNTER — Ambulatory Visit (HOSPITAL_COMMUNITY)
Admission: RE | Admit: 2016-06-29 | Discharge: 2016-06-29 | Disposition: A | Discharge status: Home / Self Care | Payer: BC Managed Care – PPO | Source: Ambulatory Visit | Attending: Gastroenterology | Admitting: Gastroenterology

## 2016-06-29 ENCOUNTER — Ambulatory Visit (HOSPITAL_COMMUNITY): Payer: BC Managed Care – PPO

## 2016-06-29 ENCOUNTER — Telehealth: Payer: Self-pay | Admitting: Gastroenterology

## 2016-06-29 DIAGNOSIS — K219 Gastro-esophageal reflux disease without esophagitis: Secondary | ICD-10-CM | POA: Diagnosis not present

## 2016-06-29 DIAGNOSIS — R1310 Dysphagia, unspecified: Secondary | ICD-10-CM | POA: Diagnosis not present

## 2016-06-29 DIAGNOSIS — K449 Diaphragmatic hernia without obstruction or gangrene: Secondary | ICD-10-CM | POA: Insufficient documentation

## 2016-06-29 NOTE — Telephone Encounter (Signed)
Spoke with patient and she states her CXR was faxed to Dr. Havery Moros. Per MD, CXR was normal in Nov. Needs to f/u with PCP if she needs CT chest. Patient aware.

## 2016-07-02 ENCOUNTER — Other Ambulatory Visit: Payer: Self-pay | Admitting: *Deleted

## 2016-07-02 MED ORDER — DESIPRAMINE HCL 10 MG PO TABS: ORAL_TABLET | ORAL | Status: DC

## 2016-07-05 ENCOUNTER — Ambulatory Visit (INDEPENDENT_AMBULATORY_CARE_PROVIDER_SITE_OTHER): Payer: Self-pay

## 2016-07-05 DIAGNOSIS — G3184 Mild cognitive impairment, so stated: Secondary | ICD-10-CM

## 2016-07-05 DIAGNOSIS — Z0289 Encounter for other administrative examinations: Secondary | ICD-10-CM

## 2016-07-06 ENCOUNTER — Other Ambulatory Visit: Payer: Self-pay | Admitting: Cardiology

## 2016-07-06 ENCOUNTER — Ambulatory Visit
Admission: RE | Admit: 2016-07-06 | Discharge: 2016-07-06 | Disposition: A | Discharge status: Home / Self Care | Payer: BC Managed Care – PPO | Source: Ambulatory Visit | Attending: Cardiology | Admitting: Cardiology

## 2016-07-06 ENCOUNTER — Ambulatory Visit (INDEPENDENT_AMBULATORY_CARE_PROVIDER_SITE_OTHER): Payer: BC Managed Care – PPO

## 2016-07-06 DIAGNOSIS — R079 Chest pain, unspecified: Secondary | ICD-10-CM

## 2016-07-06 DIAGNOSIS — R410 Disorientation, unspecified: Secondary | ICD-10-CM

## 2016-07-06 DIAGNOSIS — R413 Other amnesia: Secondary | ICD-10-CM

## 2016-07-06 NOTE — Procedures (Signed)
    History:  [name] Stone is a 65 year old patient with a history of a progressive memory disturbance. The patient has had increasing issues with short-term memory, difficulty with getting lost with driving. The patient being evaluated for this issue.  This is a routine EEG. No skull defects are noted. Medications include atenolol, vitamin D, estradiol, Mobic, and Nexium.   EEG classification: Normal awake  Description of the recording: The background rhythms of this recording consists of a fairly well modulated medium amplitude alpha rhythm of 8 Hz that is reactive to eye opening and closure. As the record progresses, the patient appears to remain in the waking state throughout the recording. Photic stimulation was performed, resulting in a bilateral and symmetric photic driving response. Hyperventilation was also performed, resulting in a minimal buildup of the background rhythm activities without significant slowing seen. At no time during the recording does there appear to be evidence of spike or spike wave discharges or evidence of focal slowing. EKG monitor shows no evidence of cardiac rhythm abnormalities with a heart rate of 60.  Impression: This is a normal EEG recording in the waking state. No evidence of ictal or interictal discharges are seen.

## 2016-07-09 ENCOUNTER — Other Ambulatory Visit (HOSPITAL_COMMUNITY): Payer: Self-pay | Admitting: Family Medicine

## 2016-07-09 DIAGNOSIS — R1013 Epigastric pain: Principal | ICD-10-CM

## 2016-07-09 DIAGNOSIS — K319 Disease of stomach and duodenum, unspecified: Secondary | ICD-10-CM

## 2016-07-11 ENCOUNTER — Telehealth: Payer: Self-pay

## 2016-07-11 NOTE — Telephone Encounter (Signed)
-----   Message from Larey Seat, MD sent at 07/10/2016  5:01 PM EDT ----- Normal EEG , CD

## 2016-07-11 NOTE — Telephone Encounter (Signed)
I spoke to pt and advised her that her eeg was normal. Pt verbalized understanding of results. Pt had no questions at this time but was encouraged to call back if questions arise.

## 2016-07-16 ENCOUNTER — Encounter (HOSPITAL_COMMUNITY)
Admission: RE | Admit: 2016-07-16 | Discharge: 2016-07-16 | Disposition: A | Discharge status: Home / Self Care | Payer: BC Managed Care – PPO | Source: Ambulatory Visit | Attending: Family Medicine | Admitting: Family Medicine

## 2016-07-16 DIAGNOSIS — R1013 Epigastric pain: Secondary | ICD-10-CM

## 2016-07-16 DIAGNOSIS — K30 Functional dyspepsia: Secondary | ICD-10-CM | POA: Diagnosis not present

## 2016-07-16 DIAGNOSIS — K319 Disease of stomach and duodenum, unspecified: Secondary | ICD-10-CM

## 2016-07-16 MED ORDER — TECHNETIUM TC 99M MEBROFENIN IV KIT
5.2000 | PACK | Freq: Once | INTRAVENOUS | Status: AC | PRN
  Administered 2016-07-16: 5 via INTRAVENOUS

## 2016-07-25 ENCOUNTER — Ambulatory Visit (INDEPENDENT_AMBULATORY_CARE_PROVIDER_SITE_OTHER): Payer: BC Managed Care – PPO | Admitting: Neurology

## 2016-07-25 ENCOUNTER — Encounter: Payer: Self-pay | Admitting: Neurology

## 2016-07-25 VITALS — BP 90/60 | HR 80 | Resp 20 | Ht 65.0 in | Wt 169.0 lb

## 2016-07-25 DIAGNOSIS — L270 Generalized skin eruption due to drugs and medicaments taken internally: Secondary | ICD-10-CM | POA: Diagnosis not present

## 2016-07-25 DIAGNOSIS — G3184 Mild cognitive impairment, so stated: Secondary | ICD-10-CM | POA: Diagnosis not present

## 2016-07-25 MED ORDER — NEPHRO-VITE RX 1 MG PO TABS: 1.0000 | ORAL_TABLET | Freq: Every day | ORAL | 3 refills | Status: DC

## 2016-07-25 NOTE — Patient Instructions (Signed)
Cognitive Tips  Keep a journal/notebook with sections for the following (or use sections separately as needed):  Calendar and appointment sheet, schedule for each day, lists of reminders (such as grocery lists or "to do" list), homework assignments for therapy, important information such as family and friends names / addresses / phone numbers, medications, medical history and doctors name / phone numbers.  Avoid / remove clutter and unnecessary items from areas such as countertops / cabinets in kitchen and bathroom, closets, etc.  Organize items by purpose.  Baskets and bins help with this.  Leave notes for reminders above task to be completed.  For example: Note to turn off stove over the the stove; note to lock door beside the door, not to brush teeth then wash face by sink, note to take medication on table etc.  To help recall names of people of people or things, mentally or verbally go through the alphabet to try to determine the 1st letter of the word as this may trigger the name or word you are looking for.  If this is too difficult and someone else knows the word, have them give you the first letter by asking, "does it start with an "A", "B", "C" etc. (or have them give you the first sound of the word or some other clue).  Review family events, occasions, names, etc.  Pictures are a good way to trigger memory.  Have others correct you if you answer something incorrectly.  Have them speak slowly with a few words to give you time to process and respond.  Don't let others automatically problem-solve. (For example: don't let them automatically lay out clothes in the correct position, but hand it to you folded so that you can figure it out for yourself.)  However, if you need help with tasks, they should give you as little as they can so that you can be successful.  If appropriate and safe, they may allow you to make mistakes so that you can figure out how to correct the error.  (For example, they  may allow you to put your shoes on the wrong foot to see if you notice that is wrong).  If you struggle, they should give you a cue.  (Example: "Do your shoes feel right?"  "Do they look right?") 

## 2016-07-25 NOTE — Progress Notes (Signed)
NEUROLOGY  CLINIC   Provider:  Larey Seat, M D  Referring Provider: Primary Care Physician:   Melinda Crutch, MD  Chief Complaint  Patient presents with  . Follow-up    memory    HPI:  [name] Stone is a 65 y.o. female , seen here as a referral from Dr. Harrington Challenger ,  Chief complaint according to patient :  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  From the beginning of her career she was a patient of Dr. Norberto Sorenson formerly and [place] primary care physician. She has now switched to Dr. Harrington Challenger. She is here to be evaluated again for memory loss. I saw the patient a little over 3 years ago with the same chief complaint, but by now she feels that it appears her work. Students have quoted her saying something and previous lectures that she cannot recall. And she has a borderline score on today's Montral cognitive assessment. She scored 25 out of 30 points and given her higher level of education she should have score 26 and above. She has great trouble with immediate recall for example a phone number that she has to repeatedly say aloud -or better write down- to be able to completely dial it. She also has been getting lost when she drives and has to reorient herself to the surroundings. It is not necessarily that she does not know where she is -that she does not recall where she is heading to.She lives off [address]  In early spring 2014 the patient had presented with an MRI performed outside at [facility], she had a neuropsychological battery for memory loss performed at [facility] and all I did at that time was to repeat some of the tests and an EEG. The EEG was borderline slow but still in the normal range for age. Dr Marijean Bravo was her  Neurologist, [place].  PLAN 1) [name] Stone is a slightly everted right foot when she walks but she does not have tendon problems is no drifting noticed and she turns with 3 steps. She has normal arm swing, no cogwheeling normal shoulder shrug and mobility overall. There is no evidence of a parkinsonism. There is no evidence to suspect normal pressure hydrocephalus and there have been no hallucinations or visual delusions reported that would lead to other dementia forms.  2) at this time the patient is still in the category of mild cognitive impairment. She is fully oriented she did very well with visual spatial testing executive function she misnamed 1 of 3 animals, but she had significant difficulties with the immediate or delayed recall of words. This also fits which she reports about telephone numbers. She cannot keep them in the mind long enough before she can dial.   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 The patient was treated briefly with Aricept over 3 years ago but could not tolerate the medication very well not mainly because of GI symptoms- felt swimmy headed.   Interval history from 07/25/2016, at the pleasure of seeing Dr. Jenne Stone today again her protein electrophoresis results were normal and metabolic panel was entirely normal her EEG was normal performed on 07/06/2016.  Her MRI of the brain with and without contrast  was also normal, interpreted by my colleague Dr. Bonnita Levan from 06/11/2016. 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 She lives alone. Lost her dog in October. Mother lives at age 79 near by.  Non smoker, no ETOH, caffeine- 2 cups of coffee, stopped 3 weeks ago for GERD.   Review of Systems: Out of a complete 14 system review, the patient complains of only the following symptoms, and all other reviewed systems are negative.  The patient reports difficulty swallowing she often has the feeling that food is stuck in her esophagus and she has pain and heartburn. She has a referral to GI at [facility]. She just underwent an upper GI endoscopy here which showed no abnormality and a manometric pressure of the esophagus was measured she has not seen the result of this yet. She also reports joint pain, somewhat less interest in activities, memory loss.  She underwent a also needed knee surgery in July 2015, arthroscopic. She had bilateral spinal fusion surgery December 2014, after my last visit with her.   Social History   Social History  . Marital status: Single    Spouse name: N/A  . Number of children: 0  . Years of education: N/A   Occupational History  . [employer]   Social History Main Topics  . Smoking status: Never Smoker  . Smokeless tobacco: Never Used  . Alcohol use No  . Drug use: No  . Sexual activity: No   Other Topics Concern  . Not on file   Social History Narrative  . No narrative on file    Family History  Problem Relation Age of Onset  . Colon cancer Neg Hx   . Skin cancer Father   . Skin cancer Brother   . Skin cancer Paternal Grandfather   . Colon polyps Brother   . Colitis Mother     Past Medical History:  Diagnosis Date  . Ankle fracture, left 2005  . Cancer  ([place])    basal cell ca removed from arm  . Complaints of memory disturbance    evaluated by Dr  Marijean Bravo  . Esophageal stricture   . Esophagitis   . GERD (gastroesophageal reflux disease)   . PAC (premature atrial contraction)     Past Surgical History:  Procedure Laterality Date  . ABDOMINAL HYSTERECTOMY  1998  . ANTERIOR CERVICAL DECOMP/DISCECTOMY FUSION  12/11/2011   Procedure: ANTERIOR CERVICAL DECOMPRESSION/DISCECTOMY FUSION 2 LEVELS;  Surgeon: Earleen Newport;  Location: [facility];  Service: Neurosurgery;  Laterality: N/A;  C5-6 C6-7 Anterior cervical decompression/diskectomy fusion  . ANTERIOR FUSION CERVICAL SPINE  2000  . COLONOSCOPY     every 5 years  for FH colon polyps  . CORNEAL TRANSPLANT    . ESOPHAGEAL MANOMETRY N/A 05/25/2016   Procedure: ESOPHAGEAL MANOMETRY (EM);  Surgeon: Manus Gunning, MD;  Location: [facility];  Service: Gastroenterology;  Laterality: N/A;  . EYE SURGERY     x 5 right eye  . INCONTINENCE SURGERY    . KNEE ARTHROSCOPY Right 2015  . KNEE ARTHROSCOPY W/ LATERAL RELEASE Right   . KNEE ARTHROSCOPY W/ MENISCAL REPAIR Left     Current Outpatient Prescriptions  Medication Sig Dispense Refill  . atenolol (TENORMIN) 25 MG tablet Take 25 mg by mouth daily.    . Cholecalciferol (VITAMIN D) 2000 units CAPS Take 1 capsule by mouth daily.    Marland Kitchen estradiol (VIVELLE-DOT) 0.1 MG/24HR Place 1 patch onto the skin 2 (two) times a week. ON Wednesday AND SATURDAY     . meloxicam (MOBIC) 15 MG tablet Take 15 mg by mouth daily.    . pantoprazole (PROTONIX) 40 MG tablet Take 1 tablet (40 mg total) by mouth 2 (two) times daily. 60 tablet 3   No current facility-administered medications for this visit.     Allergies as of 07/25/2016 - Review Complete 07/25/2016  Allergen Reaction Noted  . Aspirin  12/18/2010  . Demerol [meperidine hcl]  12/05/2011    Vitals: BP 90/60   Pulse 80   Resp 20   Ht 5\' 5"  (1.651 m)   Wt 169 lb (76.7 kg)   BMI 28.12 kg/m    Last Weight:  Wt Readings from Last 1 Encounters:  07/25/16 169 lb (76.7 kg)   [id number] mass index is 28.12 kg/m.     Last Height:   Ht Readings from Last 1 Encounters:  07/25/16 5\' 5"  (1.651 m)    Physical exam:  General: The patient is awake, alert and appears not in acute distress. The patient is well groomed. Head: Normocephalic, atraumatic. Neck is supple.   Cardiovascular:  Regular rate and rhythm, without  murmurs or carotid bruit, and without distended neck veins. Respiratory: Lungs are clear to auscultation. Skin:  Without evidence of edema, or rash Trunk:   Neurologic exam : The patient is awake and alert, oriented to place and time.   Memory subjective described as impaired  Memory testing revealed   [place]: Montreal Cognitive Assessment  07/25/2016 05/29/2016  Visuospatial/ Executive (0/5) 5 5  Naming (0/3) 3 2  Attention: Read list of digits (0/2) 2 2  Attention: Read list of letters (0/1) 1 1  Attention: Serial 7 subtraction starting at 100 (0/3) 3 3  Language: Repeat phrase (0/2) 2 2  Language : Fluency (0/1) 1 1  Abstraction (0/2) 2 2  Delayed Recall (0/5) 3 1  Orientation (0/6) 6 6  Total 28 25  Adjusted Score (based on education) 28 25    Attention span & concentration ability appears normal. Her Speech is fluent,  without dysarthria, dysphonia or aphasia. Mood and affect are appropriate.  Cranial nerves: Pupils are equal and briskly reactive to light. Funduscopic exam without  evidence of pallor or edema. Extraocular movements  in vertical and horizontal planes intact and without nystagmus. Visual fields by finger perimetry are intact. Hearing to finger rub intact. Facial sensation intact to fine touch. Facial motor strength is symmetric and tongue and uvula move midline. Shoulder shrug was symmetrical.   Motor exam: Normal tone, muscle bulk and symmetric strength in all extremities. Sensory:  Fine touch, pinprick and vibration were tested in all  extremities. Proprioception tested in  the upper extremities was normal. Coordination: Rapid alternating movements / Finger-to-nose maneuver  normal without evidence of ataxia, dysmetria or tremor.  Gait and station: Patient walks without assistive device and is able unassisted to climb up to the exam table. Strength within normal limits.  Stance is stable and normal.  Tandem gait is unfragmented. Turns with 3 Steps. Romberg testing is negative.  Deep tendon reflexes: in the  upper and lower extremities are symmetric and intact. Babinski maneuver response is downgoing.  The patient was advised of the nature of the diagnosed memory disorder , the treatment options and risks for general a health and wellness arising from not treating the condition.  I spent more than 25 minutes of face to face time with the patient. Greater than 50% of time was spent in counseling and coordination of care, the discussion of her MOCA results ( improved ) and the Gi symptoms, allowing not for Aricept., which made her lightheaded.   We have discussed the diagnosis and differential and I answered the patient's questions.     Assessment:  After physical and neurologic examination, review of laboratory studies,  Personal review of imaging studies, reports of other /same  Imaging studies ,  Results of  neurophysiology testing and pre-existing records as far as provided in visit., my assessment is   Plan:  Treatment plan and additional workup : Her cognitive concerns have been present for over 5 years now. Since her MRI was from 2012  Was unchanged to 2017s, I am not concerned about a vascular or degenerative disorder .   She has severe gastric reflux with abdominal discomfort and pain, restricting her diet. I asked her to take a prenatal multivitamin , which is usually easier tolerated. It should be taken with food to be better absorbed.She had an abdominal ultrasound and HIDA scan  without abnormalities. I'm glad she is  referred to a tertiary care center.- she may have a malabsorption problem.  Rv PRN with either Np or me.  Will perform [facility]   Larey Seat MD  07/25/2016   CC: Myriam Jacobson, MD

## 2016-07-25 NOTE — Addendum Note (Signed)
Addended by: Larey Seat on: 07/25/2016 02:16 PM   Modules accepted: Orders

## 2016-07-26 LAB — CBC WITH DIFFERENTIAL/PLATELET
BASOS: 0 %
Basophils Absolute: 0 10*3/uL (ref 0.0–0.2)
EOS (ABSOLUTE): 0.2 10*3/uL (ref 0.0–0.4)
EOS: 3 %
HEMATOCRIT: 36.6 % (ref 34.0–46.6)
Hemoglobin: 11.4 g/dL (ref 11.1–15.9)
IMMATURE GRANS (ABS): 0 10*3/uL (ref 0.0–0.1)
IMMATURE GRANULOCYTES: 0 %
LYMPHS: 41 %
Lymphocytes Absolute: 3.7 10*3/uL — ABNORMAL HIGH (ref 0.7–3.1)
MCH: 26.5 pg — ABNORMAL LOW (ref 26.6–33.0)
MCHC: 31.1 g/dL — ABNORMAL LOW (ref 31.5–35.7)
MCV: 85 fL (ref 79–97)
MONOS ABS: 0.5 10*3/uL (ref 0.1–0.9)
Monocytes: 5 %
NEUTROS PCT: 51 %
Neutrophils Absolute: 4.5 10*3/uL (ref 1.4–7.0)
Platelets: 304 10*3/uL (ref 150–379)
RBC: 4.3 x10E6/uL (ref 3.77–5.28)
RDW: 15.2 % (ref 12.3–15.4)
WBC: 8.9 10*3/uL (ref 3.4–10.8)

## 2016-07-27 ENCOUNTER — Telehealth: Payer: Self-pay | Admitting: *Deleted

## 2016-07-27 NOTE — Telephone Encounter (Signed)
Spoke to pt and relayed lab results.  She did get call from [facility] of vitamin ready for pick up.  She verbalized understanding.

## 2016-07-27 NOTE — Telephone Encounter (Signed)
LMVM for pt to return call for lab results.  

## 2016-07-27 NOTE — Telephone Encounter (Signed)
-----   Message from Larey Seat, MD sent at 07/26/2016  4:52 PM EDT ----- Dear Dr Criss Rosales, PhD [facility] , take prenatal vitamins !! Normal eosinophil count

## 2016-10-21 ENCOUNTER — Emergency Department (HOSPITAL_BASED_OUTPATIENT_CLINIC_OR_DEPARTMENT_OTHER): Payer: BC Managed Care – PPO

## 2016-10-21 ENCOUNTER — Encounter (HOSPITAL_BASED_OUTPATIENT_CLINIC_OR_DEPARTMENT_OTHER): Payer: Self-pay | Admitting: Emergency Medicine

## 2016-10-21 ENCOUNTER — Emergency Department (HOSPITAL_BASED_OUTPATIENT_CLINIC_OR_DEPARTMENT_OTHER)
Admission: EM | Admit: 2016-10-21 | Discharge: 2016-10-21 | Disposition: A | Discharge status: Home / Self Care | Payer: BC Managed Care – PPO | Attending: Emergency Medicine | Admitting: Emergency Medicine

## 2016-10-21 DIAGNOSIS — Z79818 Long term (current) use of other agents affecting estrogen receptors and estrogen levels: Secondary | ICD-10-CM | POA: Diagnosis not present

## 2016-10-21 DIAGNOSIS — R6884 Jaw pain: Secondary | ICD-10-CM

## 2016-10-21 DIAGNOSIS — Z79899 Other long term (current) drug therapy: Secondary | ICD-10-CM | POA: Insufficient documentation

## 2016-10-21 DIAGNOSIS — Z85828 Personal history of other malignant neoplasm of skin: Secondary | ICD-10-CM | POA: Insufficient documentation

## 2016-10-21 LAB — CBC WITH DIFFERENTIAL/PLATELET
Basophils Absolute: 0.1 10*3/uL (ref 0.0–0.1)
Basophils Relative: 1 %
EOS PCT: 3 %
Eosinophils Absolute: 0.3 10*3/uL (ref 0.0–0.7)
HCT: 35.6 % — ABNORMAL LOW (ref 36.0–46.0)
Hemoglobin: 11.5 g/dL — ABNORMAL LOW (ref 12.0–15.0)
LYMPHS ABS: 2.8 10*3/uL (ref 0.7–4.0)
LYMPHS PCT: 36 %
MCH: 26.9 pg (ref 26.0–34.0)
MCHC: 32.3 g/dL (ref 30.0–36.0)
MCV: 83.2 fL (ref 78.0–100.0)
MONO ABS: 0.6 10*3/uL (ref 0.1–1.0)
Monocytes Relative: 8 %
Neutro Abs: 4.2 10*3/uL (ref 1.7–7.7)
Neutrophils Relative %: 52 %
PLATELETS: 309 10*3/uL (ref 150–400)
RBC: 4.28 MIL/uL (ref 3.87–5.11)
RDW: 15.6 % — ABNORMAL HIGH (ref 11.5–15.5)
WBC: 7.9 10*3/uL (ref 4.0–10.5)

## 2016-10-21 LAB — SEDIMENTATION RATE: Sed Rate: 17 mm/hr (ref 0–22)

## 2016-10-21 MED ORDER — ACETAMINOPHEN 500 MG PO TABS
1000.0000 mg | ORAL_TABLET | Freq: Once | ORAL | Status: AC
  Administered 2016-10-21: 1000 mg via ORAL
  Filled 2016-10-21: qty 2

## 2016-10-21 NOTE — ED Triage Notes (Signed)
Pt reports left jaw pain that started last night, worsening through the night and waking her up around 0230.  Pt states mild left side headache as well.

## 2016-10-21 NOTE — ED Provider Notes (Signed)
[facility] Provider Note   CSN: [id number] Arrival date & time: 10/21/16  2778     History   Chief Complaint Chief Complaint  Patient presents with  . Jaw Pain    HPI [name] Stone is a 65 y.o. female.  65 year old female with past medical history including GERD, esophageal stricture who presents with left jaw pain.Yesterday afternoon while at rest, the patient began having left-sided jaw pain.She noticed that the pain was worse when she tried to clench her teeth. She denies any change in her diet or heavy chewing yesterday.She has been able to open her mouth well. The pain persisted overnight and she noticed that sleeping on her left side made it hurt worse so she tried to sit up and was unable to sleep due to the severity of the pain. She denies any dental pain. She has gradually developed a left-sided headache because of the jaw pain.She denies any associated chest pain, shortness of breath, fever, or recent illness.She has never had this problem before.   The history is provided by the patient.    Past Medical History:  Diagnosis Date  . Ankle fracture, left 2005  . Cancer ([place])    basal cell ca removed from arm  . Complaints of memory disturbance    evaluated by Dr  Marijean Bravo  . Esophageal stricture   . Esophagitis   . GERD (gastroesophageal reflux disease)   . PAC (premature atrial contraction)     Patient Active Problem List   Diagnosis Date Noted  . Atypical chest pain   . Mild cognitive impairment with memory loss 04/29/2013  . Cervical spondylosis with radiculopathy 12/11/2011  . FECAL OCCULT BLOOD 02/18/2009  . ESOPHAGEAL STRICTURE 02/16/2009  . GERD 02/16/2009  . MENOPAUSE, SURGICAL 02/24/2008  . SKIN CANCER, HX OF 02/24/2008  . Personal history of other diseases of digestive system 02/24/2008    Past Surgical History:  Procedure Laterality Date  . ABDOMINAL HYSTERECTOMY  1998  . ANTERIOR CERVICAL DECOMP/DISCECTOMY FUSION  12/11/2011   Procedure: ANTERIOR CERVICAL DECOMPRESSION/DISCECTOMY FUSION 2 LEVELS;  Surgeon: Earleen Newport;  Location: [facility];  Service: Neurosurgery;  Laterality: N/A;  C5-6 C6-7 Anterior cervical decompression/diskectomy fusion  . ANTERIOR FUSION CERVICAL SPINE  2000  . COLONOSCOPY     every 5 years for FH colon polyps  . CORNEAL TRANSPLANT    . ESOPHAGEAL MANOMETRY N/A 05/25/2016   Procedure: ESOPHAGEAL MANOMETRY (EM);  Surgeon: Manus Gunning, MD;  Location: [facility];  Service: Gastroenterology;  Laterality: N/A;  . EYE SURGERY     x 5 right eye  . INCONTINENCE SURGERY    . KNEE ARTHROSCOPY Right 2015  . KNEE ARTHROSCOPY W/ LATERAL RELEASE Right   . KNEE ARTHROSCOPY W/ MENISCAL REPAIR Left     OB History    No data available       Home Medications    Prior to Admission medications   Medication Sig Start Date End Date Taking? Authorizing Provider  atenolol (TENORMIN) 25 MG tablet Take 25 mg by mouth daily.   Yes Historical Provider, MD  B Complex-C-Folic Acid (B COMPLEX-VITAMIN C-FOLIC ACID) 1 MG tablet Take 1 tablet by mouth daily with breakfast. 07/25/16  Yes Larey Seat, MD  Cholecalciferol (VITAMIN D) 2000 units CAPS Take 1 capsule by mouth daily.   Yes Historical Provider, MD  esomeprazole (NEXIUM) 40 MG capsule Take 40 mg by mouth 2 (two) times daily before a meal.   Yes Historical Provider, MD  estradiol (VIVELLE-DOT) 0.1 MG/24HR Place 1 patch onto the skin 2 (two) times a week. ON Wednesday AND SATURDAY    Yes Historical Provider, MD  meloxicam (MOBIC) 15 MG tablet Take 15 mg by mouth daily.   Yes Historical Provider, MD  pantoprazole (PROTONIX) 40 MG tablet Take 1 tablet (40 mg total) by mouth 2 (two) times daily. 06/20/16   Manus Gunning, MD    Family History Family History  Problem Relation Age of Onset  . Skin cancer Father   . Skin cancer Brother   . Skin cancer Paternal Grandfather   . Colon polyps Brother   . Colitis Mother   . Colon cancer  Neg Hx     Social History Social History  Substance Use Topics  . Smoking status: Never Smoker  . Smokeless tobacco: Never Used  . Alcohol use No     Allergies   Aspirin and Demerol [meperidine hcl]   Review of Systems Review of Systems 10 Systems reviewed and are negative for acute change except as noted in the HPI.   Physical Exam Updated Vital Signs BP 120/87   Pulse 80   Temp 98.3 F (36.8 C) (Oral)   Resp 16   Ht '5\' 5"'$  (1.651 m)   Wt 173 lb (78.5 kg)   SpO2 100%   BMI 28.79 kg/m   Physical Exam  Constitutional: She is oriented to person, place, and time. She appears well-developed and well-nourished. No distress.  HENT:  Head: Normocephalic and atraumatic.  Mouth/Throat: Oropharynx is clear and moist.  Moist mucous membranes No trismus Normal dentition No tenderness to palpation of L mandible  Eyes: Conjunctivae are normal. Pupils are equal, round, and reactive to light.  Neck: Neck supple.  Cardiovascular: Normal rate, regular rhythm and normal heart sounds.   No murmur heard. Pulmonary/Chest: Effort normal and breath sounds normal.  Abdominal: Soft. Bowel sounds are normal. She exhibits no distension. There is no tenderness.  Musculoskeletal: She exhibits no edema.  Lymphadenopathy:    She has no cervical adenopathy.  Neurological: She is alert and oriented to person, place, and time. She exhibits normal muscle tone. Coordination normal.  Fluent speech 5/5 strength and normal sensation throughout  Skin: Skin is warm and dry.  Psychiatric: She has a normal mood and affect. Judgment normal.  Nursing note and vitals reviewed.    ED Treatments / Results  Labs (all labs ordered are listed, but only abnormal results are displayed) Labs Reviewed  CBC WITH DIFFERENTIAL/PLATELET - Abnormal; Notable for the following:       Result Value   Hemoglobin 11.5 (*)    HCT 35.6 (*)    RDW 15.6 (*)    All other components within normal limits  SEDIMENTATION  RATE    EKG  EKG Interpretation  Date/Time:  Sunday October 21 2016 08:42:26 EDT Ventricular Rate:  82 PR Interval:    QRS Duration: 87 QT Interval:  359 QTC Calculation: 420 R Axis:   53 Text Interpretation:  Sinus rhythm No significant change since last tracing Confirmed by Sapphire Tygart MD, Hailie Searight [phone]) on 10/21/2016 9:08:33 AM       Radiology Dg Mandible 4 Views  Result Date: 10/21/2016 CLINICAL DATA:  Left jaw pain near left ear starting last pm, pt states she is unable to bite down completely secondary to pain. EXAM: MANDIBLE - 4+ VIEW COMPARISON:  None. FINDINGS: There is no evidence of fracture or other focal bone lesions. Normal appearance of the temporomandibular joints. The  molars do not appose, left greater than right, secondary to positioning, pain, or functional limitation. IMPRESSION: 1. No acute fracture or dislocation. 2. Malocclusion versus positioning. Electronically Signed   By: Nolon Nations M.D.   On: 10/21/2016 10:50    Procedures Procedures (including critical care time)  Medications Ordered in ED Medications  acetaminophen (TYLENOL) tablet 1,000 mg (1,000 mg Oral Given 10/21/16 0936)     Initial Impression / Assessment and Plan / ED Course  I have reviewed the triage vital signs and the nursing notes.  Pertinent labs & imaging results that were available during my care of the patient were reviewed by me and considered in my medical decision making (see chart for details).  Clinical Course   Patient with left jaw pain beginning yesterday and worsening overnight. Well-appearing with reassuring vital signs. No signs of infection, swelling, or skin changes on exam. No dental problems on exam. Normal neurologic exam. Obtained CBC and ESR which were normal, reassuring against infection or temporal arteritis. Mandible shows malocclusion which may be caused problem or may be due to the patient's positioning. Instructed her to follow-up with her dentist to check  her teeth alignment as well as to follow-up with PCP as it is possible she has TMJ pain. She voiced understanding of plan and return precautions and was discharged in satisfactory condition.  Final Clinical Impressions(s) / ED Diagnoses   Final diagnoses:  Jaw pain    New Prescriptions Discharge Medication List as of 10/21/2016 11:33 AM       Sharlett Iles, MD 10/21/16 1651

## 2016-11-21 ENCOUNTER — Ambulatory Visit (INDEPENDENT_AMBULATORY_CARE_PROVIDER_SITE_OTHER): Payer: BC Managed Care – PPO | Admitting: Allergy and Immunology

## 2016-11-21 ENCOUNTER — Encounter: Payer: Self-pay | Admitting: Allergy and Immunology

## 2016-11-21 VITALS — BP 128/76 | HR 76 | Temp 98.0°F | Resp 14 | Ht 63.86 in | Wt 176.8 lb

## 2016-11-21 DIAGNOSIS — K200 Eosinophilic esophagitis: Secondary | ICD-10-CM | POA: Diagnosis not present

## 2016-11-21 DIAGNOSIS — L500 Allergic urticaria: Secondary | ICD-10-CM

## 2016-11-21 DIAGNOSIS — L299 Pruritus, unspecified: Secondary | ICD-10-CM

## 2016-11-21 MED ORDER — FLUTICASONE PROPIONATE HFA 220 MCG/ACT IN AERO: INHALATION_SPRAY | RESPIRATORY_TRACT | 5 refills | Status: DC

## 2016-11-21 NOTE — Progress Notes (Signed)
Dear Dr. Harrington Challenger,  Thank you for referring [name] Stone to the [facility] on 11/21/2016.   Below is a summation of this patient's evaluation and recommendations.  Thank you for your referral. I will keep you informed about this patient's response to treatment.   If you have any questions please do not hesitate to contact me.   Sincerely,  Jiles Prows, MD [facility]   ______________________________________________________________________    NEW PATIENT NOTE  Referring Provider: Lawerance Cruel, MD Primary Provider: Melinda Crutch, MD Date of office visit: 11/21/2016    Subjective:   Chief Complaint:  [name] Stone (DOB: 05-26-1951) is a 65 y.o. female who presents to the clinic on 11/21/2016 with a chief complaint of Pruritus and Urticaria .     HPI: [name] Stone presents this clinic in evaluation of hives.  Apparently [name] Stone has been having recurrent raised red itchy lesions that never heal with scar or hyperpigmentation and never last greater than a day since May 2017 without any other associated systemic or constitutional symptoms except for diffuse pruritus and itchiness of her scalp. There is no environmental trigger giving rise to this issue that can be identified. She's not had any new medications and she's not had any new changes in her home or exposures. She's had some blood test which apparently have been negative in regard to a specific disease that may account for her immunological hyperreactivity  However, it should be noted that [name] Stone has developed problems with her reflux starting around the same time in May 2017. Her reflux now presents with rather significant chest pain. It is difficult for her to eat without precipitating pain. She's lost about 20 pounds of weight. She's had her Nexium increase to twice a day and has had evaluation at [facility] with 2  upper endoscopies and a pH meter test and manometry. The results of her [place] and manometry are not available at this point but her second upper endoscopy identified 22 eosinophils per high-power field in the distal part of her esophagus with out any eosinophilia in the proximal part of her esophagus.  Past Medical History:  Diagnosis Date  . Ankle fracture, left 2005  . Cancer ([place])    basal cell ca removed from arm  . Complaints of memory disturbance    evaluated by Dr  Marijean Bravo  . Esophageal stricture   . Esophagitis   . GERD (gastroesophageal reflux disease)   . PAC (premature atrial contraction)     Past Surgical History:  Procedure Laterality Date  . ABDOMINAL HYSTERECTOMY  1998  . ANTERIOR CERVICAL DECOMP/DISCECTOMY FUSION  12/11/2011   Procedure: ANTERIOR CERVICAL DECOMPRESSION/DISCECTOMY FUSION 2 LEVELS;  Surgeon: Earleen Newport;  Location: [place] [facility]UR[facility] ORS;  Service: Neurosurgery;  Laterality: N/A;  C5-6 C6-7 Anterior cervical decompression/diskectomy fusion  . ANTERIOR FUSION CERVICAL SPINE  2000  . COLONOSCOPY     every 5 years for FH colon polyps  . CORNEAL TRANSPLANT    . ESOPHAGEAL MANOMETRY N/A 05/25/2016   Procedure: ESOPHAGEAL MANOMETRY (EM);  Surgeon: Manus Gunning, MD;  Location: [facility];  Service: Gastroenterology;  Laterality: N/A;  . EYE SURGERY     x 5 right eye  . INCONTINENCE SURGERY    . KNEE ARTHROSCOPY Right 2015  . KNEE ARTHROSCOPY W/ LATERAL RELEASE Right   . KNEE ARTHROSCOPY W/ MENISCAL REPAIR Left  Medication List      atenolol 25 MG tablet Commonly known as:  TENORMIN Take 25 mg by mouth daily.   B complex-vitamin C-folic acid 1 MG tablet Take 1 tablet by mouth daily with breakfast.   esomeprazole 40 MG capsule Commonly known as:  NEXIUM Take 40 mg by mouth 2 (two) times daily before a meal.   estradiol 0.1 MG/24HR patch Commonly known as:  VIVELLE-DOT Place 1 patch onto the skin 2 (two) times a week. ON Wednesday AND  SATURDAY   meloxicam 15 MG tablet Commonly known as:  MOBIC Take 15 mg by mouth daily.   Vitamin D 2000 units Caps Take 1 capsule by mouth daily.       Allergies  Allergen Reactions  . Oseltamivir Nausea And Vomiting  . Aspirin     REACTION: retinal hemorhage  . Demerol [Meperidine Hcl]     REACTION: hallucinations    Review of systems negative except as noted in HPI / PMHx or noted below:  Review of Systems  Constitutional: Negative.   HENT: Negative.   Eyes: Negative.   Respiratory: Negative.   Cardiovascular: Negative.   Gastrointestinal: Negative.   Genitourinary: Negative.   Musculoskeletal: Negative.   Skin: Negative.   Neurological: Negative.        Has seen neurologist concerning her memory issue and started on nephro-vite  Endo/Heme/Allergies: Negative.   Psychiatric/Behavioral: Negative.     Family History  Problem Relation Age of Onset  . Skin cancer Father   . Bladder Cancer Father   . Skin cancer Brother   . Skin cancer Paternal Grandfather   . Colon polyps Brother   . Colitis Mother   . Colon cancer Neg Hx     Social History   Social History  . Marital status: Single    Spouse name: N/A  . Number of children: 0  . Years of education: N/A   Occupational History  . [employer]   Social History Main Topics  . Smoking status: Never Smoker  . Smokeless tobacco: Never Used  . Alcohol use No  . Drug use: No  . Sexual activity: No   Other Topics Concern  . Not on file   Social History Narrative  . No narrative on file    Environmental and Social history  Lives in a house with a dry environment, no animals located inside the household, no carpeting in the bedroom, no plastic on the bed or pillow, and no smokers located inside the household.  Objective:   Vitals:   11/21/16 1208  BP: 128/76  Pulse: 76  Resp: 14  Temp: 98 F (36.7 C)   Height: 5' 3.86" (162.2 cm) Weight: 176 lb 12.8 oz (80.2 kg)  Physical Exam    Constitutional: She is well-developed, well-nourished, and in no distress.  HENT:  Head: Normocephalic. Head is without right periorbital erythema and without left periorbital erythema.  Right Ear: Tympanic membrane, external ear and ear canal normal.  Left Ear: Tympanic membrane, external ear and ear canal normal.  Nose: Nose normal. No mucosal edema or rhinorrhea.  Mouth/Throat: Oropharynx is clear and moist and mucous membranes are normal. No oropharyngeal exudate.  Eyes: Conjunctivae and lids are normal. Pupils are equal, round, and reactive to light.  Neck: Trachea normal. No tracheal deviation present. No thyromegaly present.  Cardiovascular: Normal rate, regular rhythm, S1 normal, S2 normal and normal heart sounds.   No murmur heard. Pulmonary/Chest: Effort normal. No stridor. No tachypnea. No  respiratory distress. She has no wheezes. She has no rales. She exhibits no tenderness.  Abdominal: Soft. She exhibits no distension and no mass. There is no hepatosplenomegaly. There is no tenderness. There is no rebound and no guarding.  Musculoskeletal: She exhibits no edema or tenderness.  Lymphadenopathy:       Head (right side): No tonsillar adenopathy present.       Head (left side): No tonsillar adenopathy present.    She has no cervical adenopathy.    She has no axillary adenopathy.  Neurological: She is alert. Gait normal.  Skin: No rash noted. She is not diaphoretic. No erythema. No pallor. Nails show no clubbing.  Psychiatric: Mood and affect normal.    Diagnostics: Allergy skin tests were performed. She did not demonstrate any hypersensitivity to a screening panel of aeroallergens or foods.  Assessment and Plan:    1. Allergic urticaria   2. Pruritic disorder   3. Possible Eosinophilic esophagitis     1. Allergen avoidance measures?  2. Use Zyrtec 10 mg one or 2 times per day  3. Use Flovent 220 - 2 puffs and swallows twice a day  4. Continue Nexium twice a  day  5. Review all blood tests. Further testing?  6. Return to clinic in 4 weeks or earlier if problem  The etiology of Sandy's immunological hyperreactivity is unknown at this point in time. Fortunately, when using Zyrtec she has pretty good control this issue and she is welcome to use 10-20 mg daily to address this issue. I will review all the blood tests that a been performed to date to make a determination about whether or not she needs further testing. I will empirically treat her for eosinophilic esophagitis associated with chest pain. Although she doesn't meet the criteria of very significant eosinophilic esophagitis based upon her biopsy data I think it be worthwhile to empirically treat her for this condition as she is having very significant chest pain when she eats and we should know within a month whether or not this will help with this issue.  Jiles Prows, MD [facility]

## 2016-11-21 NOTE — Patient Instructions (Addendum)
  1. Allergen avoidance measures?  2. Use Zyrtec 10 mg one or 2 times per day  3. Use Flovent 220 - 2 puffs and swallows twice a day  4. Continue Nexium twice a day   5. Review all blood tests. Further testing?  6. Return to clinic in 4 weeks or earlier if problem

## 2016-12-18 ENCOUNTER — Ambulatory Visit: Payer: BC Managed Care – PPO | Admitting: Allergy and Immunology

## 2017-01-29 ENCOUNTER — Encounter: Payer: Self-pay | Admitting: Allergy and Immunology

## 2017-01-29 ENCOUNTER — Ambulatory Visit (INDEPENDENT_AMBULATORY_CARE_PROVIDER_SITE_OTHER): Payer: BC Managed Care – PPO | Admitting: Allergy and Immunology

## 2017-01-29 VITALS — BP 114/74 | HR 72 | Resp 14

## 2017-01-29 DIAGNOSIS — L299 Pruritus, unspecified: Secondary | ICD-10-CM | POA: Diagnosis not present

## 2017-01-29 DIAGNOSIS — K200 Eosinophilic esophagitis: Secondary | ICD-10-CM

## 2017-01-29 DIAGNOSIS — L500 Allergic urticaria: Secondary | ICD-10-CM | POA: Diagnosis not present

## 2017-01-29 NOTE — Patient Instructions (Signed)
  1. Use Zyrtec 10 mg one or 2 times per day  2. Use Flovent 220 - 2 puffs and swallows twice a day  3. Continue Nexium twice a day   4. Return to clinic in 4 weeks or earlier if problem

## 2017-01-29 NOTE — Progress Notes (Signed)
Follow-up Note  Referring Provider: Lawerance Cruel, MD Primary Provider: Melinda Crutch, MD Date of Office Visit: 01/29/2017  Subjective:   [name] Stone (DOB: Sep 06, 1951) is a 66 y.o. female who returns to the [facility] on 01/29/2017 in re-evaluation of the following:  HPI: [name] Stone returns to this clinic in reevaluation of her urticaria and the issue with reflux and possible eosinophilic esophagitis. I have not seen her in his clinic since her initial evaluation 11/21/2016.  She has no urticaria as long as she continues to use Zyrtec.  When I asked her to use Flovent as treatment for potential eosinophilic esophagitis giving rise to chest pain during her last visit she only did so for a few weeks and she did notice a 50% decrease in her chest pain while using that medication but because of some logistical issues and some domestic issues including the death of her father she moved away from using Flovent and is not using this on a consistent basis. She did have further evaluation of her reflux disease including manometry and was suggested that she undergo a Nissen fundoplication.  She did receive the flu vaccine this year.  Allergies as of 01/29/2017      Reactions   Oseltamivir Nausea And Vomiting   Aspirin    REACTION: retinal hemorhage   Demerol [meperidine Hcl]    REACTION: hallucinations      Medication List      cetirizine 10 MG tablet Commonly known as:  ZYRTEC Take 10 mg by mouth daily.   esomeprazole 40 MG capsule Commonly known as:  NEXIUM Take 40 mg by mouth 2 (two) times daily before a meal.   estradiol 0.05 MG/24HR patch Commonly known as:  VIVELLE-DOT Place onto the skin.   fluticasone 220 MCG/ACT inhaler Commonly known as:  FLOVENT HFA Use two puffs and swallow twice daily.   meloxicam 15 MG tablet Commonly known as:  MOBIC Take 15 mg by mouth daily.   metoprolol succinate 25 MG 24 hr tablet Commonly known as:  TOPROL-XL Take 25  mg by mouth daily.   NEPHRO-VITE PO Take by mouth daily.   Vitamin D 2000 units Caps Take 1 capsule by mouth daily.       Past Medical History:  Diagnosis Date  . Ankle fracture, left 2005  . Cancer ([place])    basal cell ca removed from arm  . Complaints of memory disturbance    evaluated by Dr  Marijean Bravo  . Esophageal stricture   . Esophagitis   . GERD (gastroesophageal reflux disease)   . PAC (premature atrial contraction)     Past Surgical History:  Procedure Laterality Date  . ABDOMINAL HYSTERECTOMY  1998  . ANTERIOR CERVICAL DECOMP/DISCECTOMY FUSION  12/11/2011   Procedure: ANTERIOR CERVICAL DECOMPRESSION/DISCECTOMY FUSION 2 LEVELS;  Surgeon: Earleen Newport;  Location: [place] NEURO ORS;  Service: Neurosurgery;  Laterality: N/A;  C5-6 C6-7 Anterior cervical decompression/diskectomy fusion  . ANTERIOR FUSION CERVICAL SPINE  2000  . COLONOSCOPY     every 5 years for FH colon polyps  . CORNEAL TRANSPLANT    . ESOPHAGEAL MANOMETRY N/A 05/25/2016   Procedure: ESOPHAGEAL MANOMETRY (EM);  Surgeon: Manus Gunning, MD;  [facility];  Service: Gastroenterology;  Laterality: N/A;  . EYE SURGERY     x 5 right eye  . INCONTINENCE SURGERY    . KNEE ARTHROSCOPY Right 2015  . KNEE ARTHROSCOPY W/ LATERAL RELEASE Right   . KNEE ARTHROSCOPY  W/ MENISCAL REPAIR Left     Review of systems negative except as noted in HPI / PMHx or noted below:  Review of Systems  Constitutional: Negative.   HENT: Negative.   Eyes: Negative.   Respiratory: Negative.   Cardiovascular: Negative.   Gastrointestinal: Negative.   Genitourinary: Negative.   Musculoskeletal: Negative.   Skin: Negative.   Neurological: Negative.   Endo/Heme/Allergies: Negative.   Psychiatric/Behavioral: Negative.      Objective:   Vitals:   01/29/17 1559  BP: 114/74  Pulse: 72  Resp: 14          Physical Exam  Constitutional: She is well-developed, well-nourished, and in no distress.  HENT:    Head: Normocephalic.  Right Ear: Tympanic membrane, external ear and ear canal normal.  Left Ear: Tympanic membrane, external ear and ear canal normal.  Nose: Nose normal. No mucosal edema or rhinorrhea.  Mouth/Throat: Uvula is midline, oropharynx is clear and moist and mucous membranes are normal. No oropharyngeal exudate.  Eyes: Conjunctivae are normal.  Neck: Trachea normal. No tracheal tenderness present. No tracheal deviation present. No thyromegaly present.  Cardiovascular: Normal rate, regular rhythm, S1 normal, S2 normal and normal heart sounds.   No murmur heard. Pulmonary/Chest: Breath sounds normal. No stridor. No respiratory distress. She has no wheezes. She has no rales.  Musculoskeletal: She exhibits no edema.  Lymphadenopathy:       Head (right side): No tonsillar adenopathy present.       Head (left side): No tonsillar adenopathy present.    She has no cervical adenopathy.  Neurological: She is alert. Gait normal.  Skin: No rash noted. She is not diaphoretic. No erythema. Nails show no clubbing.  Psychiatric: Mood and affect normal.    Diagnostics: none    Assessment and Plan:   1. Allergic urticaria   2. Pruritic disorder   3. Eosinophilic esophagitis     1. Use Zyrtec 10 mg one or 2 times per day  2. Use Flovent 220 - 2 puffs and swallows twice a day  3. Continue Nexium twice a day   4. Return to clinic in 4 weeks or earlier if problem  [name] Stone appears to have very good control of her hyperactive immune system with the use of Zyrtec and thus will hold off on any further evaluation looking for etiologic factors contributing to this immunological hyperreactivity. Certainly if she does develop significant problems as we move forward she will need to undergo further evaluation. For her chest pain I would like for her to consistently use swallowed Flovent for the next 4 weeks. If she has resolution of her chest pain then her chest pain is probably based on her  eosinophilic esophagitis and we can treat her for this condition with standard therapy. If she does not have any improvemnet as a result of using swallowed Flovent then I think that she does need to consider a fundoplication for uncontrolled reflux. I will see her back in this clinic in 4 weeks.  Allena Katz, MD [place]

## 2017-03-05 ENCOUNTER — Ambulatory Visit: Payer: BC Managed Care – PPO | Admitting: Allergy and Immunology

## 2017-03-26 ENCOUNTER — Encounter: Payer: Self-pay | Admitting: Allergy and Immunology

## 2017-03-26 ENCOUNTER — Ambulatory Visit (INDEPENDENT_AMBULATORY_CARE_PROVIDER_SITE_OTHER): Payer: BC Managed Care – PPO | Admitting: Allergy and Immunology

## 2017-03-26 VITALS — BP 122/78 | HR 68 | Resp 18

## 2017-03-26 DIAGNOSIS — K200 Eosinophilic esophagitis: Secondary | ICD-10-CM

## 2017-03-26 DIAGNOSIS — L500 Allergic urticaria: Secondary | ICD-10-CM

## 2017-03-26 NOTE — Progress Notes (Signed)
Follow-up Note  Referring Provider: Lawerance Cruel, MD Primary Provider: Melinda Crutch, MD Date of Office Visit: 03/26/2017  Subjective:   [name] Stone (DOB: [birth year]) is a 66 y.o. female who returns to the [facility] on 03/26/2017 in re-evaluation of the following:  HPI: [name] Stone presents to this clinic in reevaluation of her urticaria and reflux and possible eosinophilic esophagitis. She was last seen in this clinic in January 2018.  She is much better at this point time regarding her urticaria as long as she continues on Zyrtec. She has not developed any associated systemic or constitutional symptoms or significant flares.  She had a repeat upper endoscopy about 3 weeks ago and there were no esophageal eosinophils present. She required an esophageal dilation for what appeared to be some tonic contraction. Apparently she is scheduled to have a surgical procedure to address her ongoing reflux disease. She was given diltiazem in an attempt to relax her esophagus. She still occasionally has chest pain and apparently with a impedance probe her chest pain did correlate with episodes of non-acid reflux.  Allergies as of 03/26/2017      Reactions   Aspirin    REACTION: retinal hemorhage   Demerol [meperidine Hcl]    REACTION: hallucinations      Medication List      cetirizine 10 MG tablet Commonly known as:  ZYRTEC Take 10 mg by mouth daily.   diltiazem 30 MG tablet Commonly known as:  CARDIZEM   esomeprazole 40 MG capsule Commonly known as:  NEXIUM Take 40 mg by mouth 2 (two) times daily before a meal.   estradiol 0.05 MG/24HR patch Commonly known as:  VIVELLE-DOT Place onto the skin.   fluticasone 220 MCG/ACT inhaler Commonly known as:  FLOVENT HFA Use two puffs and swallow twice daily.   meloxicam 15 MG tablet Commonly known as:  MOBIC Take 15 mg by mouth daily.   metoprolol succinate 25 MG 24 hr tablet Commonly known as:  TOPROL-XL Take 25 mg  by mouth daily.   NEPHRO-VITE PO Take by mouth daily.   Vitamin D 2000 units Caps Take 1 capsule by mouth daily.       Past Medical History:  Diagnosis Date  . Ankle fracture, left 2005  . Cancer ([place])    basal cell ca removed from arm  . Complaints of memory disturbance    evaluated by Dr  Marijean Bravo  . Esophageal stricture   . Esophagitis   . GERD (gastroesophageal reflux disease)   . PAC (premature atrial contraction)     Past Surgical History:  Procedure Laterality Date  . ABDOMINAL HYSTERECTOMY  1998  . ANTERIOR CERVICAL DECOMP/DISCECTOMY FUSION  12/11/2011   Procedure: ANTERIOR CERVICAL DECOMPRESSION/DISCECTOMY FUSION 2 LEVELS;  Surgeon: Earleen Newport;  Location: [place] [facility]S;  Service: Neurosurgery;  Laterality: N/A;  C5-6 C6-7 Anterior cervical decompression/diskectomy fusion  . ANTERIOR FUSION CERVICAL SPINE  2000  . COLONOSCOPY     every 5 years for FH colon polyps  . CORNEAL TRANSPLANT    . ESOPHAGEAL MANOMETRY N/A 05/25/2016   Procedure: ESOPHAGEAL MANOMETRY (EM);  Surgeon: Manus Gunning, MD;  [facility];  Service: Gastroenterology;  Laterality: N/A;  . EYE SURGERY     x 5 right eye  . INCONTINENCE SURGERY    . KNEE ARTHROSCOPY Right 2015  . KNEE ARTHROSCOPY W/ LATERAL RELEASE Right   . KNEE ARTHROSCOPY W/ MENISCAL REPAIR Left     Review  of systems negative except as noted in HPI / PMHx or noted below:  Review of Systems  Constitutional: Negative.   HENT: Negative.   Eyes: Negative.   Respiratory: Negative.   Cardiovascular: Negative.   Gastrointestinal: Negative.   Genitourinary: Negative.   Musculoskeletal: Negative.   Skin: Negative.   Neurological: Negative.   Endo/Heme/Allergies: Negative.   Psychiatric/Behavioral: Negative.      Objective:   Vitals:   03/26/17 1034  BP: 122/78  Pulse: 68  Resp: 18          Physical Exam  Constitutional: She is well-developed, well-nourished, and in no distress.  HENT:  Head:  Normocephalic.  Right Ear: Tympanic membrane, external ear and ear canal normal.  Left Ear: Tympanic membrane, external ear and ear canal normal.  Nose: Nose normal. No mucosal edema or rhinorrhea.  Mouth/Throat: Uvula is midline, oropharynx is clear and moist and mucous membranes are normal. No oropharyngeal exudate.  Eyes: Conjunctivae are normal.  Neck: Trachea normal. No tracheal tenderness present. No tracheal deviation present. No thyromegaly present.  Cardiovascular: Normal rate, regular rhythm, S1 normal, S2 normal and normal heart sounds.   No murmur heard. Pulmonary/Chest: Breath sounds normal. No stridor. No respiratory distress. She has no wheezes. She has no rales.  Musculoskeletal: She exhibits no edema.  Lymphadenopathy:       Head (right side): No tonsillar adenopathy present.       Head (left side): No tonsillar adenopathy present.    She has no cervical adenopathy.  Neurological: She is alert. Gait normal.  Skin: No rash noted. She is not diaphoretic. No erythema. Nails show no clubbing.  Psychiatric: Mood and affect normal.    Diagnostics: none   Assessment and Plan:   1. Allergic urticaria   2. Eosinophilic esophagitis     1. Use Zyrtec 10 mg one or 2 times per day  2. Decrease Flovent 220 - 2 puffs and swallows ONCE a day  3. Continue Nexium 40mg  twice a day   4. Return to clinic in 12 weeks or earlier if problem  We will now begin to taper [name] off her swallowed steroid by decreasing her Flovent 50% today. When she returns to this clinic in 12 week we'll attempt to decrease another 50% from that dose. At 24 weeks from today she will then discontinue her Flovent and we'll see what happens as she moves forward without this anti-inflammatory agent. If she has a flare of eosinophilic esophagitis after that point she may require dietary manipulation including possible four food elimination diet. She'll continue to use Zyrtec regarding her urticaria which has  resulted in excellent control of this issue. I'll see her back in this clinic in 12 weeks.  Allena Katz, MD [facility] [place]

## 2017-03-26 NOTE — Patient Instructions (Addendum)
  1. Use Zyrtec 10 mg one or 2 times per day  2. Decrease Flovent 220 - 2 puffs and swallows ONCE a day  3. Continue Nexium 40mg  twice a day   4. Return to clinic in 12 weeks or earlier if problem

## 2017-04-24 HISTORY — PX: NISSEN FUNDOPLICATION: SHX2091

## 2017-06-26 ENCOUNTER — Ambulatory Visit: Payer: BC Managed Care – PPO | Admitting: Allergy and Immunology

## 2017-11-29 ENCOUNTER — Other Ambulatory Visit (HOSPITAL_COMMUNITY): Payer: Self-pay | Admitting: Emergency Medicine

## 2017-11-29 NOTE — Patient Instructions (Signed)
[name] Stone  11/29/2017   Your procedure is scheduled on: 12-04-17  Report to [facility] Main  Entrance Take [place]  elevators to 3rd floor to  [place] at 6AM.   Call this number if you have problems the morning of surgery [phone]    Remember: ONLY 1 PERSON MAY GO WITH YOU TO SHORT STAY TO GET  READY MORNING OF [place].  Do not eat food or drink liquids :After Midnight.     Take these medicines the morning of surgery with A SIP OF WATER: diltiazem, metoprolol, zyrtec, esomeprazole, inhaler as needed (may bring to hospital), eye drops                                 You may not have any metal on your body including hair pins and              piercings  Do not wear jewelry, make-up, lotions, powders or perfumes, deodorant             Do not wear nail polish.  Do not shave  48 hours prior to surgery.         Do not bring valuables to the hospital. Colon.  Contacts, dentures or bridgework may not be worn into surgery.  Leave suitcase in the car. After surgery it may be brought to your room.                Please read over the following fact sheets you were given: _____________________________________________________________________            [facility] - Preparing for Surgery Before surgery, you can play an important role.  Because skin is not sterile, your skin needs to be as free of germs as possible.  You can reduce the number of germs on your skin by washing with CHG (chlorahexidine gluconate) soap before surgery.  CHG is an antiseptic cleaner which kills germs and bonds with the skin to continue killing germs even after washing. Please DO NOT use if you have an allergy to CHG or antibacterial soaps.  If your skin becomes reddened/irritated stop using the CHG and inform your nurse when you arrive at Short Stay. Do not shave (including legs and underarms) for at least 48 hours prior  to the first CHG shower.  You may shave your face/neck. Please follow these instructions carefully:  1.  Shower with CHG Soap the night before surgery and the  morning of Surgery.  2.  If you choose to wash your hair, wash your hair first as usual with your  normal  shampoo.  3.  After you shampoo, rinse your hair and body thoroughly to remove the  shampoo.                           4.  Use CHG as you would any other liquid soap.  You can apply chg directly  to the skin and wash                       Gently with a scrungie or clean washcloth.  5.  Apply the CHG Soap to your  body ONLY FROM THE NECK DOWN.   Do not use on face/ open                           Wound or open sores. Avoid contact with eyes, ears mouth and genitals (private parts).                       Wash face,  Genitals (private parts) with your normal soap.             6.  Wash thoroughly, paying special attention to the area where your surgery  will be performed.  7.  Thoroughly rinse your body with warm water from the neck down.  8.  DO NOT shower/wash with your normal soap after using and rinsing off  the CHG Soap.                9.  Pat yourself dry with a clean towel.            10.  Wear clean pajamas.            11.  Place clean sheets on your bed the night of your first shower and do not  sleep with pets. Day of Surgery : Do not apply any lotions/deodorants the morning of surgery.  Please wear clean clothes to the hospital/surgery center.  FAILURE TO FOLLOW THESE INSTRUCTIONS MAY RESULT IN THE CANCELLATION OF YOUR SURGERY PATIENT SIGNATURE_________________________________  NURSE SIGNATURE__________________________________  ________________________________________________________________________   Adam Phenix  An incentive spirometer is a tool that can help keep your lungs clear and active. This tool measures how well you are filling your lungs with each breath. Taking long deep breaths may help reverse or  decrease the chance of developing breathing (pulmonary) problems (especially infection) following:  A long period of time when you are unable to move or be active. BEFORE THE PROCEDURE   If the spirometer includes an indicator to show your best effort, your nurse or respiratory therapist will set it to a desired goal.  If possible, sit up straight or lean slightly forward. Try not to slouch.  Hold the incentive spirometer in an upright position. INSTRUCTIONS FOR USE  1. Sit on the edge of your bed if possible, or sit up as far as you can in bed or on a chair. 2. Hold the incentive spirometer in an upright position. 3. Breathe out normally. 4. Place the mouthpiece in your mouth and seal your lips tightly around it. 5. Breathe in slowly and as deeply as possible, raising the piston or the ball toward the top of the column. 6. Hold your breath for 3-5 seconds or for as long as possible. Allow the piston or ball to fall to the bottom of the column. 7. Remove the mouthpiece from your mouth and breathe out normally. 8. Rest for a few seconds and repeat Steps 1 through 7 at least 10 times every 1-2 hours when you are awake. Take your time and take a few normal breaths between deep breaths. 9. The spirometer may include an indicator to show your best effort. Use the indicator as a goal to work toward during each repetition. 10. After each set of 10 deep breaths, practice coughing to be sure your lungs are clear. If you have an incision (the cut made at the time of surgery), support your incision when coughing by placing a pillow or rolled up towels firmly against it.  Once you are able to get out of bed, walk around indoors and cough well. You may stop using the incentive spirometer when instructed by your caregiver.  RISKS AND COMPLICATIONS  Take your time so you do not get dizzy or light-headed.  If you are in pain, you may need to take or ask for pain medication before doing incentive spirometry.  It is harder to take a deep breath if you are having pain. AFTER USE  Rest and breathe slowly and easily.  It can be helpful to keep track of a log of your progress. Your caregiver can provide you with a simple table to help with this. If you are using the spirometer at home, follow these instructions: [place] IF:   You are having difficultly using the spirometer.  You have trouble using the spirometer as often as instructed.  Your pain medication is not giving enough relief while using the spirometer.  You develop fever of 100.5 F (38.1 C) or higher. SEEK IMMEDIATE MEDICAL CARE IF:   You cough up bloody sputum that had not been present before.  You develop fever of 102 F (38.9 C) or greater.  You develop worsening pain at or near the incision site. MAKE SURE YOU:   Understand these instructions.  Will watch your condition.  Will get help right away if you are not doing well or get worse. Document Released: 04/29/2007 Document Revised: 03/10/2012 Document Reviewed: 06/30/2007 [facility] Patient Information 2014 [place], [place].   ________________________________________________________________________

## 2017-12-03 ENCOUNTER — Ambulatory Visit (HOSPITAL_COMMUNITY)
Admission: RE | Admit: 2017-12-03 | Discharge: 2017-12-03 | Disposition: A | Discharge status: Home / Self Care | Payer: BC Managed Care – PPO | Source: Ambulatory Visit | Attending: Surgical | Admitting: Surgical

## 2017-12-03 ENCOUNTER — Encounter (HOSPITAL_COMMUNITY): Payer: Self-pay | Admitting: Anesthesiology

## 2017-12-03 ENCOUNTER — Other Ambulatory Visit: Payer: Self-pay

## 2017-12-03 ENCOUNTER — Encounter (HOSPITAL_COMMUNITY)
Admission: RE | Admit: 2017-12-03 | Discharge: 2017-12-03 | Disposition: A | Discharge status: Home / Self Care | Payer: BC Managed Care – PPO | Source: Ambulatory Visit | Attending: Orthopedic Surgery | Admitting: Orthopedic Surgery

## 2017-12-03 ENCOUNTER — Encounter (HOSPITAL_COMMUNITY): Payer: Self-pay

## 2017-12-03 DIAGNOSIS — J9811 Atelectasis: Secondary | ICD-10-CM

## 2017-12-03 DIAGNOSIS — M1711 Unilateral primary osteoarthritis, right knee: Secondary | ICD-10-CM

## 2017-12-03 DIAGNOSIS — Z01818 Encounter for other preprocedural examination: Secondary | ICD-10-CM

## 2017-12-03 DIAGNOSIS — Z01810 Encounter for preprocedural cardiovascular examination: Secondary | ICD-10-CM | POA: Insufficient documentation

## 2017-12-03 DIAGNOSIS — Z01812 Encounter for preprocedural laboratory examination: Secondary | ICD-10-CM | POA: Insufficient documentation

## 2017-12-03 HISTORY — DX: Family history of other specified conditions: Z84.89

## 2017-12-03 HISTORY — DX: Other allergy status, other than to drugs and biological substances: Z91.09

## 2017-12-03 LAB — BASIC METABOLIC PANEL
Anion gap: 7 (ref 5–15)
BUN: 16 mg/dL (ref 6–20)
CALCIUM: 9 mg/dL (ref 8.9–10.3)
CO2: 28 mmol/L (ref 22–32)
CREATININE: 0.59 mg/dL (ref 0.44–1.00)
Chloride: 105 mmol/L (ref 101–111)
GFR calc non Af Amer: >60 mL/min (ref >60)
Glucose, Bld: 99 mg/dL (ref 65–99)
Potassium: 3.8 mmol/L (ref 3.5–5.1)
SODIUM: 140 mmol/L (ref 135–145)

## 2017-12-03 LAB — ABO/RH: ABO/RH(D): B POS

## 2017-12-03 LAB — CBC
HCT: 37.1 % (ref 36.0–46.0)
Hemoglobin: 11.9 g/dL — ABNORMAL LOW (ref 12.0–15.0)
MCH: 28.6 pg (ref 26.0–34.0)
MCHC: 32.1 g/dL (ref 30.0–36.0)
MCV: 89.2 fL (ref 78.0–100.0)
PLATELETS: 260 10*3/uL (ref 150–400)
RBC: 4.16 MIL/uL (ref 3.87–5.11)
RDW: 15.3 % (ref 11.5–15.5)
WBC: 7.4 10*3/uL (ref 4.0–10.5)

## 2017-12-03 LAB — SURGICAL PCR SCREEN
MRSA, PCR: NEGATIVE
Staphylococcus aureus: NEGATIVE

## 2017-12-03 LAB — HEMOGLOBIN A1C
HEMOGLOBIN A1C: 6 % — AB (ref 4.8–5.6)
Mean Plasma Glucose: 125.5 mg/dL

## 2017-12-03 NOTE — H&P (Signed)
TOTAL KNEE ADMISSION H&P  Patient is being admitted for right total knee arthroplasty.  Subjective:  Chief Complaint:right knee pain.  HPI: [name] Stone, 66 y.o. female, has a history of pain and functional disability in the right knee due to arthritis and has failed non-surgical conservative treatments for greater than 12 weeks to includeNSAID's and/or analgesics, corticosteriod injections, viscosupplementation injections, flexibility and strengthening excercises and activity modification.  Onset of symptoms was gradual, starting 5 years ago with gradually worsening course since that time. The patient noted prior procedures on the knee to include  arthroscopy and menisectomy on the right knee(s).  Patient currently rates pain in the right knee(s) at 8 out of 10 with activity. Patient has night pain, worsening of pain with activity and weight bearing, pain that interferes with activities of daily living, pain with passive range of motion, crepitus and joint swelling.  Patient has evidence of periarticular osteophytes and joint space narrowing by imaging studies. There is no active infection.  Patient Active Problem List   Diagnosis Date Noted  . Atypical chest pain   . Mild cognitive impairment with memory loss 04/29/2013  . Cervical spondylosis with radiculopathy 12/11/2011  . FECAL OCCULT BLOOD 02/18/2009  . ESOPHAGEAL STRICTURE 02/16/2009  . GERD 02/16/2009  . MENOPAUSE, SURGICAL 02/24/2008  . SKIN CANCER, HX OF 02/24/2008  . Personal history of other diseases of digestive system 02/24/2008   Past Medical History:  Diagnosis Date  . Ankle fracture, left 2005  . Cancer ([place])    basal cell ca removed from arm  . Complaints of memory disturbance    evaluated by Dr  Marijean Bravo  . Esophageal stricture   . Esophagitis   . Family history of adverse reaction to anesthesia    MOTHER ALWAYS HAS N/V WITH SURGERY   . GERD (gastroesophageal reflux disease)   . History of environmental allergies     GETS HIVES OCCASIONALLY WHEN NOT USING JUICE PLUS PILLS   . PAC (premature atrial contraction)   . Palpitations   . Pre-diabetes    DENIES ; SEE HGBA1C 04-25-17 EPIC     Past Surgical History:  Procedure Laterality Date  . ABDOMINAL HYSTERECTOMY  1998  . ANTERIOR CERVICAL DECOMP/DISCECTOMY FUSION  12/11/2011   Procedure: ANTERIOR CERVICAL DECOMPRESSION/DISCECTOMY FUSION 2 LEVELS;  Surgeon: Earleen Newport;  Location: [place] [facility];  Service: Neurosurgery;  Laterality: N/A;  C5-6 C6-7 Anterior cervical decompression/diskectomy fusion  . ANTERIOR FUSION CERVICAL SPINE  2000  . COLONOSCOPY     every 5 years for FH colon polyps  . CORNEAL TRANSPLANT    . ESOPHAGEAL MANOMETRY N/A 05/25/2016   Procedure: ESOPHAGEAL MANOMETRY (EM);  Surgeon: Manus Gunning, MD;  Location: [facility];  Service: Gastroenterology;  Laterality: N/A;  . EYE SURGERY     x 5 right eye  . INCONTINENCE SURGERY    . KNEE ARTHROSCOPY Right 2015  . KNEE ARTHROSCOPY W/ LATERAL RELEASE Right   . KNEE ARTHROSCOPY W/ MENISCAL REPAIR Left   . NISSEN FUNDOPLICATION  [id number]      Current Outpatient Medications:  .  cetirizine (ZYRTEC) 10 MG tablet, Take 10 mg by mouth daily., Disp: , Rfl:  .  Cholecalciferol (VITAMIN D) 2000 units CAPS, Take 2,000 Units by mouth daily. , Disp: , Rfl:  .  diltiazem (CARDIZEM) 30 MG tablet, Take 30 mg by mouth 3 (three) times daily. , Disp: , Rfl:  .  esomeprazole (NEXIUM) 40 MG capsule, Take 40 mg by mouth daily. ,  Disp: , Rfl:  .  estradiol (VIVELLE-DOT) 0.05 MG/24HR patch, Place 1 patch onto the skin 2 (two) times a week. , Disp: , Rfl:  .  fluticasone (FLOVENT HFA) 220 MCG/ACT inhaler, Use two puffs and swallow twice daily. (Patient taking differently: Inhale 1 puff into the lungs 2 (two) times daily as needed (ALLERGIES). Use two puffs and swallow twice daily.), Disp: 1 Inhaler, Rfl: 5 .  LOTEMAX 0.5 % ophthalmic suspension, INTSILL 1  DROP IN RIGHT EYE ONCE WEEKLY ON  WEDNESDAYS,  .  metoprolol succinate (TOPROL-XL) 25 MG 24 hr tablet, Take 25 mg by mouth daily. , Disp: , Rfl:  .  UNABLE TO FIND, Take 2 tablets by mouth daily. Juice Plus Veggie Blend, Disp: , Rfl:  .  UNABLE TO FIND, Take 2 tablets by mouth daily. Juice Plus Fruit Blend, Disp: , Rfl:  .  UNABLE TO FIND, Take 2 tablets by mouth daily. Juice Plus [employer], Disp: , Rfl:  .  UNABLE TO FIND, Take 2 tablets by mouth daily. Juice Plus Omega Blend, Disp: , Rfl:   Allergies  Allergen Reactions  . Aspirin     REACTION: retinal hemorhage  . Demerol [Meperidine Hcl]     REACTION: hallucinations    Social History   Tobacco Use  . Smoking status: Never Smoker  . Smokeless tobacco: Never Used  Substance Use Topics  . Alcohol use: No    Family History  Problem Relation Age of Onset  . Skin cancer Father   . Bladder Cancer Father   . Skin cancer Brother   . Skin cancer Paternal Grandfather   . Colon polyps Brother   . Colitis Mother   . Colon cancer Neg Hx      Review of Systems  Constitutional: Negative.   HENT: Negative.   Eyes: Negative.   Respiratory: Negative.   Cardiovascular: Negative.   Gastrointestinal: Negative.   Genitourinary: Negative.   Musculoskeletal: Positive for joint pain and myalgias. Negative for back pain, falls and neck pain.  Skin: Negative.   Neurological: Negative.   Endo/Heme/Allergies: Negative.   Psychiatric/Behavioral: Negative.     Objective:  Physical Exam  Constitutional: She is oriented to person, place, and time. She appears well-developed and well-nourished. No distress.  HENT:  Head: Normocephalic and atraumatic.  Right Ear: External ear normal.  Left Ear: External ear normal.  Nose: Nose normal.  Mouth/Throat: Oropharynx is clear and moist.  Eyes: Conjunctivae and EOM are normal.  Neck: Normal range of motion. Neck supple.  Cardiovascular: Normal rate, regular rhythm, normal heart sounds and intact distal pulses.  No murmur  heard. Respiratory: Effort normal and breath sounds normal. No respiratory distress. She has no wheezes.  GI: Soft. Bowel sounds are normal. She exhibits no distension. There is no tenderness.  Musculoskeletal:       Right hip: Normal.       Left hip: Normal.       Right knee: She exhibits decreased range of motion and swelling. She exhibits no effusion and no erythema. Tenderness found. Medial joint line and lateral joint line tenderness noted.       Left knee: Normal.  Neurological: She is alert and oriented to person, place, and time. She has normal strength and normal reflexes. No sensory deficit.  Skin: No rash noted. She is not diaphoretic. No erythema.  Psychiatric: She has a normal mood and affect. Her behavior is normal.    Vital signs in last 24 hours: Temp:  [  98 F (36.7 C)] 98 F (36.7 C) (12/04 0922) Pulse Rate:  [83] 83 (12/04 0922) Resp:  [16] 16 (12/04 0922) BP: (130)/(73) 130/73 (12/04 0922) SpO2:  [97 %] 97 % (12/04 0922) Weight:  [75.3 kg (166 lb)] 75.3 kg (166 lb) (12/04 0922)  Labs:   Estimated body mass index is 26.79 kg/m as calculated from the following:   Height as of 12/03/17: 5\' 6"  (1.676 m).   Weight as of 12/03/17: 75.3 kg (166 lb).   Imaging Review Plain radiographs demonstrate severe degenerative joint disease of the right knee(s). The overall alignment ismild varus. The bone quality appears to be good for age and reported activity level.  Assessment/Plan:  End stage primary osteoarthritis, right knee   The patient history, physical examination, clinical judgment of the provider and imaging studies are consistent with end stage degenerative joint disease of the right knee(s) and total knee arthroplasty is deemed medically necessary. The treatment options including medical management, injection therapy arthroscopy and arthroplasty were discussed at length. The risks and benefits of total knee arthroplasty were presented and reviewed. The risks due to  aseptic loosening, infection, stiffness, patella tracking problems, thromboembolic complications and other imponderables were discussed. The patient acknowledged the explanation, agreed to proceed with the plan and consent was signed. Patient is being admitted for inpatient treatment for surgery, pain control, PT, OT, prophylactic antibiotics, VTE prophylaxis, progressive ambulation and ADL's and discharge planning. The patient is planning to be discharged home with home health services    PCP: Dr. Melinda Crutch Therapy Plans: HHPT then outpatient therapy in [facility]: has equipment Home with family/friends Other concerns: prefers to avoid narcotics; if needed morphine preferred over codeine   Ardeen Jourdain, PA-C

## 2017-12-03 NOTE — Anesthesia Preprocedure Evaluation (Addendum)
Anesthesia Evaluation  Patient identified by MRN, date of birth, ID band Patient awake    Reviewed: Allergy & Precautions, NPO status , Patient's Chart, lab work & pertinent test results  Airway Mallampati: II  TM Distance: >3 FB Neck ROM: Limited    Dental  (+) Teeth Intact, Dental Advisory Given   Pulmonary neg pulmonary ROS,    breath sounds clear to auscultation       Cardiovascular negative cardio ROS   Rhythm:Regular Rate:Normal - Systolic murmurs    Neuro/Psych  Neuromuscular disease    GI/Hepatic Neg liver ROS, GERD  Medicated,  Endo/Other  negative endocrine ROS  Renal/GU negative Renal ROS     Musculoskeletal  (+) Arthritis ,   Abdominal Normal abdominal exam  (+)   Peds  Hematology negative hematology ROS (+)   Anesthesia Other Findings Day of surgery medications reviewed with the patient.  Reproductive/Obstetrics                            Anesthesia Physical Anesthesia Plan  ASA: II  Anesthesia Plan: General   Post-op Pain Management:  Regional for Post-op pain   Induction: Intravenous  PONV Risk Score and Plan: 4 or greater and Ondansetron, Dexamethasone and Midazolam  Airway Management Planned: LMA  Additional Equipment:   Intra-op Plan:   Post-operative Plan: Extubation in OR  Informed Consent: I have reviewed the patients History and Physical, chart, labs and discussed the procedure including the risks, benefits and alternatives for the proposed anesthesia with the patient or authorized representative who has indicated his/her understanding and acceptance.   Dental advisory given  Plan Discussed with: CRNA  Anesthesia Plan Comments:         Anesthesia Quick Evaluation

## 2017-12-04 ENCOUNTER — Encounter (HOSPITAL_COMMUNITY): Payer: Self-pay | Admitting: Emergency Medicine

## 2017-12-04 ENCOUNTER — Inpatient Hospital Stay (HOSPITAL_COMMUNITY)
Admission: RE | Admit: 2017-12-04 | Discharge: 2017-12-06 | DRG: 470 | Disposition: A | Discharge status: Skilled Nursing Facility | Payer: BC Managed Care – PPO | Source: Ambulatory Visit | Attending: Orthopedic Surgery | Admitting: Orthopedic Surgery

## 2017-12-04 ENCOUNTER — Inpatient Hospital Stay (HOSPITAL_COMMUNITY): Payer: BC Managed Care – PPO | Admitting: Anesthesiology

## 2017-12-04 ENCOUNTER — Other Ambulatory Visit: Payer: Self-pay

## 2017-12-04 ENCOUNTER — Encounter (HOSPITAL_COMMUNITY)
Admission: RE | Disposition: A | Discharge status: Skilled Nursing Facility | Payer: Self-pay | Source: Ambulatory Visit | Attending: Orthopedic Surgery

## 2017-12-04 DIAGNOSIS — Z79899 Other long term (current) drug therapy: Secondary | ICD-10-CM | POA: Diagnosis not present

## 2017-12-04 DIAGNOSIS — Z947 Corneal transplant status: Secondary | ICD-10-CM | POA: Diagnosis not present

## 2017-12-04 DIAGNOSIS — Z85828 Personal history of other malignant neoplasm of skin: Secondary | ICD-10-CM

## 2017-12-04 DIAGNOSIS — Z981 Arthrodesis status: Secondary | ICD-10-CM

## 2017-12-04 DIAGNOSIS — Z90710 Acquired absence of both cervix and uterus: Secondary | ICD-10-CM

## 2017-12-04 DIAGNOSIS — M1711 Unilateral primary osteoarthritis, right knee: Secondary | ICD-10-CM | POA: Diagnosis present

## 2017-12-04 DIAGNOSIS — Z96651 Presence of right artificial knee joint: Secondary | ICD-10-CM

## 2017-12-04 HISTORY — PX: TOTAL KNEE ARTHROPLASTY: SHX125

## 2017-12-04 LAB — TYPE AND SCREEN
ABO/RH(D): B POS
Antibody Screen: NEGATIVE

## 2017-12-04 SURGERY — ARTHROPLASTY, KNEE, TOTAL
Anesthesia: General | Site: Knee | Laterality: Right

## 2017-12-04 MED ORDER — HYDROCODONE-ACETAMINOPHEN 5-325 MG PO TABS
ORAL_TABLET | ORAL | Status: AC
  Filled 2017-12-04: qty 1

## 2017-12-04 MED ORDER — MENTHOL 3 MG MT LOZG
1.0000 | LOZENGE | OROMUCOSAL | Status: DC | PRN
Start: 2017-12-04 — End: 2017-12-06

## 2017-12-04 MED ORDER — MIDAZOLAM HCL 2 MG/2ML IJ SOLN
INTRAMUSCULAR | Status: AC
  Filled 2017-12-04: qty 2

## 2017-12-04 MED ORDER — SODIUM CHLORIDE 0.9 % IJ SOLN
INTRAMUSCULAR | Status: AC
  Filled 2017-12-04: qty 20

## 2017-12-04 MED ORDER — DEXAMETHASONE SODIUM PHOSPHATE 10 MG/ML IJ SOLN
INTRAMUSCULAR | Status: DC | PRN
  Administered 2017-12-04: 10 mg via INTRAVENOUS

## 2017-12-04 MED ORDER — SODIUM CHLORIDE 0.9 % IR SOLN
Status: DC | PRN
  Administered 2017-12-04: 1000 mL

## 2017-12-04 MED ORDER — BUPIVACAINE-EPINEPHRINE (PF) 0.25% -1:200000 IJ SOLN
INTRAMUSCULAR | Status: AC
  Filled 2017-12-04: qty 30

## 2017-12-04 MED ORDER — HYDROMORPHONE HCL 1 MG/ML IJ SOLN
0.2500 mg | INTRAMUSCULAR | Status: DC | PRN
  Administered 2017-12-04 (×2): 0.25 mg via INTRAVENOUS

## 2017-12-04 MED ORDER — ONDANSETRON HCL 4 MG/2ML IJ SOLN
INTRAMUSCULAR | Status: DC | PRN
  Administered 2017-12-04: 4 mg via INTRAVENOUS

## 2017-12-04 MED ORDER — LACTATED RINGERS IV SOLN
INTRAVENOUS | Status: DC
  Administered 2017-12-04 (×2): via INTRAVENOUS

## 2017-12-04 MED ORDER — TRANEXAMIC ACID 1000 MG/10ML IV SOLN
1000.0000 mg | INTRAVENOUS | Status: AC
  Administered 2017-12-04: 1000 mg via INTRAVENOUS
  Filled 2017-12-04: qty 1100

## 2017-12-04 MED ORDER — PROPOFOL 10 MG/ML IV BOLUS
INTRAVENOUS | Status: DC | PRN
  Administered 2017-12-04: 170 mg via INTRAVENOUS

## 2017-12-04 MED ORDER — DILTIAZEM HCL 30 MG PO TABS
30.0000 mg | ORAL_TABLET | Freq: Three times a day (TID) | ORAL | Status: DC
  Administered 2017-12-05: 30 mg via ORAL
  Filled 2017-12-04 (×3): qty 1

## 2017-12-04 MED ORDER — METOPROLOL SUCCINATE ER 25 MG PO TB24
25.0000 mg | ORAL_TABLET | Freq: Every day | ORAL | Status: DC
Start: 2017-12-05 — End: 2017-12-06
  Administered 2017-12-06: 25 mg via ORAL
  Filled 2017-12-04 (×2): qty 1

## 2017-12-04 MED ORDER — HYDROCODONE-ACETAMINOPHEN 10-325 MG PO TABS
2.0000 | ORAL_TABLET | ORAL | Status: DC | PRN
  Administered 2017-12-05: 05:00:00 2 via ORAL
  Administered 2017-12-06: 14:00:00 1 via ORAL
  Administered 2017-12-06: 2 via ORAL
  Filled 2017-12-04 (×3): qty 2

## 2017-12-04 MED ORDER — PHENOL 1.4 % MT LIQD: 1.0000 | OROMUCOSAL | Status: DC | PRN

## 2017-12-04 MED ORDER — POLYMYXIN B SULFATE 500000 UNITS IJ SOLR
INTRAMUSCULAR | Status: DC | PRN
  Administered 2017-12-04: 500 mL

## 2017-12-04 MED ORDER — HYDROMORPHONE HCL 1 MG/ML IJ SOLN
INTRAMUSCULAR | Status: AC
  Filled 2017-12-04: qty 2

## 2017-12-04 MED ORDER — BISACODYL 5 MG PO TBEC: 5.0000 mg | DELAYED_RELEASE_TABLET | Freq: Every day | ORAL | Status: DC | PRN

## 2017-12-04 MED ORDER — SODIUM CHLORIDE 0.9 % IJ SOLN
INTRAMUSCULAR | Status: DC | PRN
  Administered 2017-12-04: 20 mL

## 2017-12-04 MED ORDER — LOTEPREDNOL ETABONATE 0.5 % OP SUSP
1.0000 [drp] | Freq: Four times a day (QID) | OPHTHALMIC | Status: DC
  Administered 2017-12-04: 18:00:00 1 [drp] via OPHTHALMIC
  Filled 2017-12-04: qty 5

## 2017-12-04 MED ORDER — LIDOCAINE 2% (20 MG/ML) 5 ML SYRINGE
INTRAMUSCULAR | Status: DC | PRN
  Administered 2017-12-04: 80 mg via INTRAVENOUS

## 2017-12-04 MED ORDER — ONDANSETRON HCL 4 MG/2ML IJ SOLN
INTRAMUSCULAR | Status: AC
  Filled 2017-12-04: qty 2

## 2017-12-04 MED ORDER — LACTATED RINGERS IV SOLN: INTRAVENOUS | Status: DC

## 2017-12-04 MED ORDER — STERILE WATER FOR IRRIGATION IR SOLN
Status: DC | PRN
  Administered 2017-12-04: 2000 mL

## 2017-12-04 MED ORDER — CHLORHEXIDINE GLUCONATE 4 % EX LIQD: 60.0000 mL | Freq: Once | CUTANEOUS | Status: DC

## 2017-12-04 MED ORDER — PROPOFOL 10 MG/ML IV BOLUS
INTRAVENOUS | Status: AC
  Filled 2017-12-04: qty 20

## 2017-12-04 MED ORDER — FENTANYL CITRATE (PF) 100 MCG/2ML IJ SOLN
INTRAMUSCULAR | Status: DC | PRN
  Administered 2017-12-04: 50 ug via INTRAVENOUS
  Administered 2017-12-04 (×2): 25 ug via INTRAVENOUS
  Administered 2017-12-04: 50 ug via INTRAVENOUS
  Administered 2017-12-04 (×2): 25 ug via INTRAVENOUS

## 2017-12-04 MED ORDER — ONDANSETRON HCL 4 MG/2ML IJ SOLN: 4.0000 mg | Freq: Four times a day (QID) | INTRAMUSCULAR | Status: DC | PRN

## 2017-12-04 MED ORDER — MEPERIDINE HCL 50 MG/ML IJ SOLN: 6.2500 mg | INTRAMUSCULAR | Status: DC | PRN

## 2017-12-04 MED ORDER — SODIUM CHLORIDE 0.9 % IV SOLN
INTRAVENOUS | Status: AC
  Filled 2017-12-04: qty 500000

## 2017-12-04 MED ORDER — LACTATED RINGERS IV SOLN
INTRAVENOUS | Status: DC
  Administered 2017-12-04: 08:00:00 via INTRAVENOUS

## 2017-12-04 MED ORDER — DEXAMETHASONE SODIUM PHOSPHATE 10 MG/ML IJ SOLN
INTRAMUSCULAR | Status: AC
  Filled 2017-12-04: qty 1

## 2017-12-04 MED ORDER — LIDOCAINE 2% (20 MG/ML) 5 ML SYRINGE
INTRAMUSCULAR | Status: AC
  Filled 2017-12-04: qty 5

## 2017-12-04 MED ORDER — ACETAMINOPHEN 325 MG PO TABS: 650.0000 mg | ORAL_TABLET | ORAL | Status: DC | PRN

## 2017-12-04 MED ORDER — CEFAZOLIN SODIUM-DEXTROSE 1-4 GM/50ML-% IV SOLN
1.0000 g | Freq: Four times a day (QID) | INTRAVENOUS | Status: AC
  Administered 2017-12-04 (×2): 1 g via INTRAVENOUS
  Filled 2017-12-04 (×2): qty 50

## 2017-12-04 MED ORDER — PROMETHAZINE HCL 25 MG/ML IJ SOLN: 6.2500 mg | INTRAMUSCULAR | Status: DC | PRN

## 2017-12-04 MED ORDER — RIVAROXABAN 10 MG PO TABS
10.0000 mg | ORAL_TABLET | Freq: Every day | ORAL | Status: DC
  Administered 2017-12-05 – 2017-12-06 (×2): 10 mg via ORAL
  Filled 2017-12-04 (×2): qty 1

## 2017-12-04 MED ORDER — ACETAMINOPHEN 650 MG RE SUPP: 650.0000 mg | RECTAL | Status: DC | PRN

## 2017-12-04 MED ORDER — DEXTROSE 5 % IV SOLN
500.0000 mg | Freq: Four times a day (QID) | INTRAVENOUS | Status: DC | PRN
  Administered 2017-12-04: 500 mg via INTRAVENOUS
  Filled 2017-12-04: qty 550

## 2017-12-04 MED ORDER — CELECOXIB 200 MG PO CAPS
200.0000 mg | ORAL_CAPSULE | Freq: Two times a day (BID) | ORAL | Status: DC
  Administered 2017-12-05 – 2017-12-06 (×4): 200 mg via ORAL
  Filled 2017-12-04 (×4): qty 1

## 2017-12-04 MED ORDER — BUPIVACAINE LIPOSOME 1.3 % IJ SUSP
INTRAMUSCULAR | Status: DC | PRN
  Administered 2017-12-04: 20 mL

## 2017-12-04 MED ORDER — ALUM & MAG HYDROXIDE-SIMETH 200-200-20 MG/5ML PO SUSP: 30.0000 mL | ORAL | Status: DC | PRN

## 2017-12-04 MED ORDER — CEFAZOLIN SODIUM-DEXTROSE 2-4 GM/100ML-% IV SOLN
2.0000 g | INTRAVENOUS | Status: AC
  Administered 2017-12-04: 2 g via INTRAVENOUS

## 2017-12-04 MED ORDER — HYDROMORPHONE HCL 1 MG/ML IJ SOLN
0.5000 mg | INTRAMUSCULAR | Status: DC | PRN
  Administered 2017-12-04 – 2017-12-06 (×3): 0.5 mg via INTRAVENOUS
  Filled 2017-12-04 (×3): qty 0.5

## 2017-12-04 MED ORDER — CEFAZOLIN SODIUM-DEXTROSE 2-4 GM/100ML-% IV SOLN
INTRAVENOUS | Status: AC
  Filled 2017-12-04: qty 100

## 2017-12-04 MED ORDER — POLYETHYLENE GLYCOL 3350 17 G PO PACK
17.0000 g | PACK | Freq: Every day | ORAL | Status: DC | PRN
  Administered 2017-12-05: 17 g via ORAL
  Filled 2017-12-04: qty 1

## 2017-12-04 MED ORDER — PANTOPRAZOLE SODIUM 40 MG PO TBEC
40.0000 mg | DELAYED_RELEASE_TABLET | Freq: Every day | ORAL | Status: DC
  Administered 2017-12-05 – 2017-12-06 (×2): 40 mg via ORAL
  Filled 2017-12-04 (×2): qty 1

## 2017-12-04 MED ORDER — FENTANYL CITRATE (PF) 100 MCG/2ML IJ SOLN
INTRAMUSCULAR | Status: AC
  Filled 2017-12-04: qty 2

## 2017-12-04 MED ORDER — HYDROCODONE-ACETAMINOPHEN 5-325 MG PO TABS
1.0000 | ORAL_TABLET | ORAL | Status: DC | PRN
  Administered 2017-12-04 – 2017-12-06 (×9): 1 via ORAL
  Filled 2017-12-04 (×8): qty 1

## 2017-12-04 MED ORDER — FERROUS SULFATE 325 (65 FE) MG PO TABS
325.0000 mg | ORAL_TABLET | Freq: Three times a day (TID) | ORAL | Status: DC
  Administered 2017-12-05 (×3): 325 mg via ORAL
  Filled 2017-12-04 (×3): qty 1

## 2017-12-04 MED ORDER — BUDESONIDE 0.25 MG/2ML IN SUSP: 0.2500 mg | Freq: Two times a day (BID) | RESPIRATORY_TRACT | Status: DC

## 2017-12-04 MED ORDER — METHOCARBAMOL 500 MG PO TABS
500.0000 mg | ORAL_TABLET | Freq: Four times a day (QID) | ORAL | Status: DC | PRN
  Administered 2017-12-04 – 2017-12-05 (×2): 500 mg via ORAL
  Filled 2017-12-04 (×2): qty 1

## 2017-12-04 MED ORDER — 0.9 % SODIUM CHLORIDE (POUR BTL) OPTIME
TOPICAL | Status: DC | PRN
  Administered 2017-12-04: 1000 mL

## 2017-12-04 MED ORDER — ONDANSETRON HCL 4 MG PO TABS: 4.0000 mg | ORAL_TABLET | Freq: Four times a day (QID) | ORAL | Status: DC | PRN

## 2017-12-04 MED ORDER — MIDAZOLAM HCL 5 MG/5ML IJ SOLN
INTRAMUSCULAR | Status: DC | PRN
  Administered 2017-12-04: 2 mg via INTRAVENOUS

## 2017-12-04 MED ORDER — FLEET ENEMA 7-19 GM/118ML RE ENEM: 1.0000 | ENEMA | Freq: Once | RECTAL | Status: DC | PRN

## 2017-12-04 MED ORDER — BUPIVACAINE LIPOSOME 1.3 % IJ SUSP
20.0000 mL | Freq: Once | INTRAMUSCULAR | Status: DC
  Filled 2017-12-04: qty 20

## 2017-12-04 SURGICAL SUPPLY — 70 items
AGENT HMST SPONGE THK3/8 (HEMOSTASIS) ×1
BAG DECANTER FOR FLEXI CONT (MISCELLANEOUS) IMPLANT
BAG SPEC THK2 15X12 ZIP CLS (MISCELLANEOUS)
BAG ZIPLOCK 12X15 (MISCELLANEOUS) IMPLANT
BANDAGE ACE 4X5 VEL STRL LF (GAUZE/BANDAGES/DRESSINGS) ×3 IMPLANT
BANDAGE ACE 6X5 VEL STRL LF (GAUZE/BANDAGES/DRESSINGS) ×3 IMPLANT
BLADE SAG 18X100X1.27 (BLADE) ×3 IMPLANT
BLADE SAW SGTL 11.0X1.19X90.0M (BLADE) ×3 IMPLANT
BNDG CMPR 82X61 PLY HI ABS (GAUZE/BANDAGES/DRESSINGS) ×1
BNDG CONFORM 4 STRL LF (GAUZE/BANDAGES/DRESSINGS) ×2 IMPLANT
BNDG CONFORM 6X.82 1P STRL (GAUZE/BANDAGES/DRESSINGS) ×2 IMPLANT
BNDG GAUZE ELAST 4 BULKY (GAUZE/BANDAGES/DRESSINGS) ×6 IMPLANT
BONE CEMENT GENTAMICIN (Cement) ×6 IMPLANT
CAP KNEE TOTAL 3 SIGMA ×2 IMPLANT
CEMENT BONE GENTAMICIN 40 (Cement) ×2 IMPLANT
CLOSURE WOUND 1/2 X4 (GAUZE/BANDAGES/DRESSINGS) ×2
COVER SURGICAL LIGHT HANDLE (MISCELLANEOUS) ×3 IMPLANT
CUFF TOURN SGL QUICK 34 (TOURNIQUET CUFF) ×3
CUFF TRNQT CYL 34X4X40X1 (TOURNIQUET CUFF) ×1 IMPLANT
DECANTER SPIKE VIAL GLASS SM (MISCELLANEOUS) ×3 IMPLANT
DRAPE INCISE IOBAN 66X45 STRL (DRAPES) IMPLANT
DRAPE U-SHAPE 47X51 STRL (DRAPES) ×3 IMPLANT
DRSG ADAPTIC 3X8 NADH LF (GAUZE/BANDAGES/DRESSINGS) ×3 IMPLANT
DRSG PAD ABDOMINAL 8X10 ST (GAUZE/BANDAGES/DRESSINGS) ×6 IMPLANT
DURAPREP 26ML APPLICATOR (WOUND CARE) ×3 IMPLANT
ELECT REM PT RETURN 15FT ADLT (MISCELLANEOUS) ×3 IMPLANT
EVACUATOR 1/8 PVC DRAIN (DRAIN) ×3 IMPLANT
FACESHIELD WRAPAROUND (MASK) ×3 IMPLANT
FACESHIELD WRAPAROUND OR TEAM (MASK) ×1 IMPLANT
GAUZE SPONGE 4X4 12PLY STRL (GAUZE/BANDAGES/DRESSINGS) ×3 IMPLANT
GLOVE BIOGEL PI IND STRL 6.5 (GLOVE) ×1 IMPLANT
GLOVE BIOGEL PI IND STRL 8.5 (GLOVE) ×1 IMPLANT
GLOVE BIOGEL PI INDICATOR 6.5 (GLOVE) ×2
GLOVE BIOGEL PI INDICATOR 8.5 (GLOVE) ×2
GLOVE ECLIPSE 8.0 STRL XLNG CF (GLOVE) ×6 IMPLANT
GLOVE SURG SS PI 6.5 STRL IVOR (GLOVE) ×3 IMPLANT
GOWN STRL REUS W/TWL LRG LVL3 (GOWN DISPOSABLE) ×3 IMPLANT
GOWN STRL REUS W/TWL XL LVL3 (GOWN DISPOSABLE) ×3 IMPLANT
HANDPIECE INTERPULSE COAX TIP (DISPOSABLE) ×3
HEMOSTAT SPONGE AVITENE ULTRA (HEMOSTASIS) ×3 IMPLANT
IMMOBILIZER KNEE 20 (SOFTGOODS) ×3
IMMOBILIZER KNEE 20 THIGH 36 (SOFTGOODS) ×1 IMPLANT
MANIFOLD NEPTUNE II (INSTRUMENTS) ×3 IMPLANT
NDL HYPO 21X1.5 SAFETY (NEEDLE) IMPLANT
NEEDLE HYPO 21X1.5 SAFETY (NEEDLE) IMPLANT
NEEDLE HYPO 22GX1.5 SAFETY (NEEDLE) IMPLANT
NS IRRIG 1000ML POUR BTL (IV SOLUTION) IMPLANT
PACK TOTAL KNEE CUSTOM (KITS) ×3 IMPLANT
PAD ABD 8X10 STRL (GAUZE/BANDAGES/DRESSINGS) ×2 IMPLANT
PADDING CAST COTTON 6X4 STRL (CAST SUPPLIES) ×3 IMPLANT
PENCIL SMOKE EVAC W/HOLSTER (ELECTROSURGICAL) ×3 IMPLANT
POSITIONER SURGICAL ARM (MISCELLANEOUS) ×3 IMPLANT
SET HNDPC FAN SPRY TIP SCT (DISPOSABLE) ×1 IMPLANT
SET PAD KNEE POSITIONER (MISCELLANEOUS) ×3 IMPLANT
SPONGE LAP 18X18 X RAY DECT (DISPOSABLE) IMPLANT
STRIP CLOSURE SKIN 1/2X4 (GAUZE/BANDAGES/DRESSINGS) ×4 IMPLANT
SUT BONE WAX W31G (SUTURE) IMPLANT
SUT MNCRL AB 4-0 PS2 18 (SUTURE) ×3 IMPLANT
SUT VIC AB 1 CT1 27 (SUTURE) ×6
SUT VIC AB 1 CT1 27XBRD ANTBC (SUTURE) ×2 IMPLANT
SUT VIC AB 2-0 CT1 27 (SUTURE) ×9
SUT VIC AB 2-0 CT1 TAPERPNT 27 (SUTURE) ×3 IMPLANT
SUT VLOC 180 0 24IN GS25 (SUTURE) ×3 IMPLANT
SYR 20CC LL (SYRINGE) ×6 IMPLANT
TAPE STRIPS DRAPE STRL (GAUZE/BANDAGES/DRESSINGS) ×2 IMPLANT
TOWER CARTRIDGE SMART MIX (DISPOSABLE) ×3 IMPLANT
TRAY FOLEY W/METER SILVER 16FR (SET/KITS/TRAYS/PACK) ×3 IMPLANT
WATER STERILE IRR 1000ML POUR (IV SOLUTION) ×3 IMPLANT
WRAP KNEE MAXI GEL POST OP (GAUZE/BANDAGES/DRESSINGS) ×3 IMPLANT
YANKAUER SUCT BULB TIP 10FT TU (MISCELLANEOUS) ×3 IMPLANT

## 2017-12-04 NOTE — Brief Op Note (Signed)
12/04/2017  10:10 AM  PATIENT:  [name] Stone  66 y.o. female  PRE-OPERATIVE DIAGNOSIS:  Right knee Primary  osteoarthritis with flexion Contracture  POST-OPERATIVE DIAGNOSIS:  Right knee Primary  osteoarthritis with Flexion contracture.  PROCEDURE:  Procedure(s): RIGHT TOTAL KNEE ARTHROPLASTY (Right)  SURGEON:  Surgeon(s) and Role:    Latanya Maudlin, MD - Primary  PHYSICIAN ASSISTANT: Ardeen Jourdain PA  ASSISTANTS: Ardeen Jourdain PA  ANESTHESIA:   general and an Adductor Block bt Anesthesia.  EBL:  50 mL   BLOOD ADMINISTERED:none  DRAINS: (one) Hemovact drain(s) in the Right Knee with  Suction Open   LOCAL MEDICATIONS USED:  Exparel 20cc with 20c of Normal Saline     SPECIMEN:  No Specimen  DISPOSITION OF SPECIMEN:  N/A  COUNTS:  YES  TOURNIQUET:  * Missing tourniquet times found for documented tourniquets in log: [id number] *  DICTATION: .Other Dictation: Dictation Number [id number]  PLAN OF CARE: Discharge to home after PACU  PATIENT DISPOSITION:  Stable in Or   Delay start of Pharmacological VTE agent (>24hrs) due to surgical blood loss or risk of bleeding: yes

## 2017-12-04 NOTE — Anesthesia Postprocedure Evaluation (Signed)
Anesthesia Post Note  Patient: [name]  Procedure(s) Performed: RIGHT TOTAL KNEE ARTHROPLASTY (Right Knee)     Patient location during evaluation: PACU Anesthesia Type: General Level of consciousness: awake and alert Pain management: pain level controlled Vital Signs Assessment: post-procedure vital signs reviewed and stable Respiratory status: spontaneous breathing, nonlabored ventilation, respiratory function stable and patient connected to nasal cannula oxygen Cardiovascular status: blood pressure returned to baseline and stable Postop Assessment: no apparent nausea or vomiting Anesthetic complications: no    Last Vitals:  Vitals:   12/04/17 1620 12/04/17 1727  BP: (!) 112/52 (!) 114/52  Pulse: 62 68  Resp: 15 18  Temp: 36.7 C (!) 36.4 C  SpO2: 96% 99%    Last Pain:  Vitals:   12/04/17 1727  TempSrc: Oral  PainSc:                  [name]

## 2017-12-04 NOTE — Transfer of Care (Signed)
Immediate Anesthesia Transfer of Care Note  Patient: [name]  Procedure(s) Performed: RIGHT TOTAL KNEE ARTHROPLASTY (Right Knee)  Patient Location: PACU  Anesthesia Type:General  Level of Consciousness: awake, alert , oriented and patient cooperative  Airway & Oxygen Therapy: Patient Spontanous Breathing and Patient connected to face mask oxygen  Post-op Assessment: Report given to RN, Post -op Vital signs reviewed and stable and Patient moving all extremities  Post vital signs: Reviewed and stable  Last Vitals:  Vitals:   12/04/17 0618  BP: 113/75  Pulse: 87  Resp: 16  Temp: 36.8 C  SpO2: 98%    Last Pain:  Vitals:   12/04/17 0618  TempSrc: Oral      Patients Stated Pain Goal: 4 (25/48/62 [id number])  Complications: No apparent anesthesia complications

## 2017-12-04 NOTE — Anesthesia Procedure Notes (Signed)
Procedure Name: LMA Insertion Date/Time: 12/04/2017 8:45 AM Performed by: Victoriano Lain, CRNA Pre-anesthesia Checklist: Patient identified, Emergency Drugs available, Suction available and Patient being monitored Patient Re-evaluated:Patient Re-evaluated prior to induction Oxygen Delivery Method: Circle system utilized Preoxygenation: Pre-oxygenation with 100% oxygen Induction Type: IV induction Ventilation: Mask ventilation without difficulty LMA: LMA with gastric port inserted LMA Size: 4.0 Number of attempts: 1 Placement Confirmation: positive ETCO2 and breath sounds checked- equal and bilateral Tube secured with: Tape Dental Injury: Teeth and Oropharynx as per pre-operative assessment

## 2017-12-04 NOTE — Evaluation (Signed)
Physical Therapy Evaluation Patient Details Name: [name] Stone MRN: [id number] DOB: [birth year] Today's Date: 12/04/2017   History of Present Illness  Pt s/p R TKR and with hx of multiple spinal fusions  Clinical Impression  Pt s/p R TKR and presents with decreased R LE strength/ROM and post op pain limiting functional mobility.  Pt should progress to dc but has limited assist.    Follow Up Recommendations Home health PT    Equipment Recommendations  None recommended by PT    Recommendations for Other Services OT consult     Precautions / Restrictions Precautions Precautions: Knee;Fall Required Braces or Orthoses: Knee Immobilizer - Right Knee Immobilizer - Right: Discontinue once straight leg raise with < 10 degree lag Restrictions Weight Bearing Restrictions: No Other Position/Activity Restrictions: WBAT      Mobility  Bed Mobility Overal bed mobility: Needs Assistance Bed Mobility: Supine to Sit;Sit to Supine     Supine to sit: Min assist Sit to supine: Min assist   General bed mobility comments: cues for sequence and use of L LE to self assist  Transfers Overall transfer level: Needs assistance   Transfers: Sit to/from Stand Sit to Stand: Min assist         General transfer comment: cues for LE management and use of UEs to self assist  Ambulation/Gait Ambulation/Gait assistance: Min assist Ambulation Distance (Feet): 25 Feet Assistive device: Rolling walker (2 wheeled) Gait Pattern/deviations: Step-to pattern;Decreased step length - right;Decreased step length - left;Shuffle;Trunk flexed Gait velocity: decr Gait velocity interpretation: Below normal speed for age/gender General Gait Details: cues for sequence, posture and position from [employer]            Wheelchair Mobility    Modified Rankin (Stroke Patients Only)       Balance                                             Pertinent Vitals/Pain Pain Assessment:  0-10 Pain Score: 5  Pain Location: R knee Pain Descriptors / Indicators: Aching;Sore Pain Intervention(s): Limited activity within patient's tolerance;Monitored during session;Premedicated before session;Ice applied    Home Living Family/patient expects to be discharged to:: Private residence Living Arrangements: Alone Available Help at Discharge: Family Type of Home: House Home Access: Stairs to enter Entrance Stairs-Rails: None Entrance Stairs-Number of Steps: 2 Home Layout: One level Home Equipment: Environmental consultant - 2 wheels      Prior Function Level of Independence: Independent               Hand Dominance        Extremity/Trunk Assessment   Upper Extremity Assessment Upper Extremity Assessment: Overall WFL for tasks assessed    Lower Extremity Assessment Lower Extremity Assessment: RLE deficits/detail    Cervical / Trunk Assessment Cervical / Trunk Assessment: Normal  Communication   Communication: No difficulties  Cognition Arousal/Alertness: Awake/alert Behavior During Therapy: WFL for tasks assessed/performed Overall Cognitive Status: Within Functional Limits for tasks assessed                                        General Comments      Exercises Total Joint Exercises Ankle Circles/Pumps: AROM;Both;15 reps;Supine   Assessment/Plan    PT Assessment Patient needs continued PT services  PT  Problem List Decreased strength;Decreased range of motion;Decreased activity tolerance;Decreased mobility;Decreased knowledge of use of DME;Pain       PT Treatment Interventions DME instruction;Gait training;Stair training;Functional mobility training;Therapeutic activities;Therapeutic exercise;Patient/family education    PT Goals (Current goals can be found in the Care Plan section)  Acute Rehab PT Goals Patient Stated Goal: Regain IND and walk without pain PT Goal Formulation: With patient Time For Goal Achievement: 12/16/17 Potential to  Achieve Goals: Good    Frequency 7X/week   Barriers to discharge Decreased caregiver support Home alone    Co-evaluation               AM-PAC PT "6 Clicks" Daily Activity  Outcome Measure Difficulty turning over in bed (including adjusting bedclothes, sheets and blankets)?: Unable Difficulty moving from lying on back to sitting on the side of the bed? : Unable Difficulty sitting down on and standing up from a chair with arms (e.g., wheelchair, bedside commode, etc,.)?: Unable Help needed moving to and from a bed to chair (including a wheelchair)?: A Little Help needed walking in hospital room?: A Little Help needed climbing 3-5 steps with a railing? : A Little 6 Click Score: 12    End of Session Equipment Utilized During Treatment: Gait belt;Right knee immobilizer Activity Tolerance: Patient tolerated treatment well;Other (comment)(nausea) Patient left: in bed;with call bell/phone within reach;with family/visitor present Nurse Communication: Mobility status PT Visit Diagnosis: Difficulty in walking, not elsewhere classified (R26.2)    Time: 6720-9470 PT Time Calculation (min) (ACUTE ONLY): 25 min   Charges:   PT Evaluation $PT Eval Low Complexity: 1 Low PT Treatments $Gait Training: 8-22 mins   PT G Codes:        Pg [phone]   [name] Stone 12/04/2017, 4:46 PM

## 2017-12-05 LAB — BASIC METABOLIC PANEL
ANION GAP: 5 (ref 5–15)
BUN: 12 mg/dL (ref 6–20)
CALCIUM: 8.6 mg/dL — AB (ref 8.9–10.3)
CO2: 30 mmol/L (ref 22–32)
Chloride: 104 mmol/L (ref 101–111)
Creatinine, Ser: 0.59 mg/dL (ref 0.44–1.00)
GFR calc Af Amer: >60 mL/min (ref >60)
GFR calc non Af Amer: >60 mL/min (ref >60)
GLUCOSE: 121 mg/dL — AB (ref 65–99)
Potassium: 4 mmol/L (ref 3.5–5.1)
Sodium: 139 mmol/L (ref 135–145)

## 2017-12-05 LAB — CBC
HEMATOCRIT: 33.5 % — AB (ref 36.0–46.0)
Hemoglobin: 10.7 g/dL — ABNORMAL LOW (ref 12.0–15.0)
MCH: 28.7 pg (ref 26.0–34.0)
MCHC: 31.9 g/dL (ref 30.0–36.0)
MCV: 89.8 fL (ref 78.0–100.0)
Platelets: 240 10*3/uL (ref 150–400)
RBC: 3.73 MIL/uL — ABNORMAL LOW (ref 3.87–5.11)
RDW: 15.2 % (ref 11.5–15.5)
WBC: 13.7 10*3/uL — AB (ref 4.0–10.5)

## 2017-12-05 MED ORDER — DILTIAZEM HCL 30 MG PO TABS
30.0000 mg | ORAL_TABLET | Freq: Three times a day (TID) | ORAL | Status: DC
  Administered 2017-12-06 (×2): 30 mg via ORAL
  Filled 2017-12-05 (×4): qty 1

## 2017-12-05 MED ORDER — LORATADINE 10 MG PO TABS
10.0000 mg | ORAL_TABLET | Freq: Two times a day (BID) | ORAL | Status: DC
  Administered 2017-12-05 – 2017-12-06 (×3): 10 mg via ORAL
  Filled 2017-12-05 (×3): qty 1

## 2017-12-05 MED ORDER — TRAMADOL HCL 50 MG PO TABS: 100.0000 mg | ORAL_TABLET | Freq: Four times a day (QID) | ORAL | Status: DC | PRN

## 2017-12-05 MED ORDER — TRAMADOL HCL 50 MG PO TABS
100.0000 mg | ORAL_TABLET | Freq: Four times a day (QID) | ORAL | Status: DC | PRN
  Administered 2017-12-05: 16:00:00 100 mg via ORAL
  Filled 2017-12-05: qty 2

## 2017-12-05 NOTE — Progress Notes (Signed)
Physical Therapy Treatment Patient Details Name: [name] [name] MRN: [id number] DOB: 07-26-1951 Today's Date: 12/05/2017    History of Present Illness Pt s/p R TKR and with hx of multiple spinal fusions    PT Comments    Pt very motivated and with steady progress with mobility.   Follow Up Recommendations  SNF     Equipment Recommendations  None recommended by PT    Recommendations for Other Services OT consult     Precautions / Restrictions Precautions Precautions: Knee;Fall Required Braces or Orthoses: Knee Immobilizer - Right Knee Immobilizer - Right: Discontinue once straight leg raise with < 10 degree lag(Pt performed IND SLR this date) Restrictions Weight Bearing Restrictions: No Other Position/Activity Restrictions: WBAT    Mobility  Bed Mobility Overal bed mobility: Needs Assistance Bed Mobility: Sit to Supine     Supine to sit: Min assist Sit to supine: Min assist   General bed mobility comments: Cues for sequence and use of L LE to self assist  Transfers Overall transfer level: Needs assistance Equipment used: Rolling walker (2 wheeled) Transfers: Sit to/from Stand Sit to Stand: Min assist Stand pivot transfers: Min assist       General transfer comment: cues for LE management and use of UEs to self assist  Ambulation/Gait Ambulation/Gait assistance: Min assist Ambulation Distance (Feet): 58 Feet(twice) Assistive device: Rolling walker (2 wheeled) Gait Pattern/deviations: Step-to pattern;Decreased step length - right;Decreased step length - left;Shuffle;Trunk flexed Gait velocity: decr Gait velocity interpretation: Below normal speed for age/gender General Gait Details: cues for sequence, posture and position from [employer]            Wheelchair Mobility    Modified Rankin (Stroke Patients Only)       Balance Overall balance assessment: No apparent balance deficits (not formally assessed)                                           Cognition Arousal/Alertness: Awake/alert Behavior During Therapy: WFL for tasks assessed/performed Overall Cognitive Status: Within Functional Limits for tasks assessed                                        Exercises Total Joint Exercises Ankle Circles/Pumps: AROM;Both;15 reps;Supine Quad Sets: AROM;Both;10 reps;Supine Heel Slides: AAROM;Right;15 reps;Supine Straight Leg Raises: AAROM;AROM;Right;15 reps;Supine    General Comments        Pertinent Vitals/Pain Pain Assessment: 0-10 Pain Score: 6  Pain Location: R knee Pain Descriptors / Indicators: Aching;Sore Pain Intervention(s): Limited activity within patient's tolerance;Monitored during session;Premedicated before session;Ice applied    Home Living                      Prior Function            PT Goals (current goals can now be found in the care plan section) Acute Rehab PT Goals Patient Stated Goal: Regain IND and walk without pain PT Goal Formulation: With patient Time For Goal Achievement: 12/16/17 Potential to Achieve Goals: Good Progress towards PT goals: Progressing toward goals    Frequency    7X/week      PT Plan Discharge plan needs to be updated    Co-evaluation  AM-PAC PT "6 Clicks" Daily Activity  Outcome Measure  Difficulty turning over in bed (including adjusting bedclothes, sheets and blankets)?: Unable Difficulty moving from lying on back to sitting on the side of the bed? : Unable Difficulty sitting down on and standing up from a chair with arms (e.g., wheelchair, bedside commode, etc,.)?: Unable Help needed moving to and from a bed to chair (including a wheelchair)?: A Little Help needed walking in hospital room?: A Little Help needed climbing 3-5 steps with a railing? : A Little 6 Click Score: 12    End of Session Equipment Utilized During Treatment: Gait belt Activity Tolerance: Patient tolerated treatment  well Patient left: in bed;with call bell/phone within reach;with family/visitor present Nurse Communication: Mobility status PT Visit Diagnosis: Difficulty in walking, not elsewhere classified (R26.2)     Time: 1511-1540 PT Time Calculation (min) (ACUTE ONLY): 29 min  Charges:  $Gait Training: 23-37 mins $Therapeutic Exercise: 8-22 mins                    G Codes:       Pg [phone]    Daltyn Degroat 12/05/2017, 3:44 PM

## 2017-12-05 NOTE — Clinical Social Work Note (Signed)
Clinical Social Work Assessment  Patient Details  Name: [name] [name] MRN: [id number] Date of Birth: 06-Apr-1951  Date of referral:  12/05/17               Reason for consult:  Facility Placement                Permission sought to share information with:  Family Supports Permission granted to share information::  Yes, Verbal Permission Granted  Name::        Agency::  SNF  Relationship::     Contact Information:     Housing/Transportation Living arrangements for the past 2 months:  [place] of Information:  Patient Patient Interpreter Needed:  None Criminal Activity/Legal Involvement Pertinent to Current Situation/Hospitalization:  No - Comment as needed Significant Relationships:  Parents Lives with:  Self Do you feel safe going back to the place where you live?  Yes Need for family participation in patient care:  Yes (Currently dependent with mobility)  Care giving concerns:  Patient has history of pain and functional disability in the right knee due to arthritis  and failed non-surgical conservative treatment.  Patient reports her original plan was to go home and have friends and family to assist. Patient reports she is unsure if family will be able to assist due to forecast of inclement weather.  The patient is dependent with mobility.   Patient provided CSW with list of facilities to inquire.   Social Worker assessment / plan:  CSW met with patient at bedside, explain role and reason for visit.  Patient is agreeable to SNF placement. CSW explain SNF process, and insurance authorization prior to discharge.  FL2 complete/ PASSR  Plan: Assist with discharge to SNF.   Employment status:  Retired Forensic scientist:  Other (Comment Required)(BCBS) PT Recommendations:  [place] / Referral to community resources:  [place]  Patient/Family's Response to care:  Agreeable and Responding well to care.    Patient/Family's Understanding of and Emotional Response to Diagnosis, Current Treatment, and Prognosis:  Patient is very knowledgeable about current treatment and after care.   Emotional Assessment Appearance:  Appears older than stated age, Developmentally appropriate Attitude/Demeanor/Rapport:    Affect (typically observed):  Accepting, Pleasant Orientation:  Oriented to Self, Oriented to Place, Oriented to  Time, Oriented to Situation Alcohol / Substance use:  Not Applicable Psych involvement (Current and /or in the community):  No (Comment)  Discharge Needs  Concerns to be addressed:  Discharge Planning Concerns Readmission within the last 30 days:  No Current discharge risk:  Dependent with Mobility Barriers to Discharge:  Ship broker, Continued Medical Work up   [employer], [place] 12/05/2017, 10:27 AM

## 2017-12-05 NOTE — NC FL2 (Signed)
[place] LEVEL OF CARE SCREENING TOOL     IDENTIFICATION  Patient Name: [name] [name] Birthdate: 1951/09/19 Sex: female Admission Date (Current Location): 12/04/2017  [facility] and [place] Number:  Herbalist and Address:  [facility],  [place] [address] [place]      Provider Number: [id number]  Attending Physician Name and Address:  Latanya Maudlin, MD  Relative Name and Phone Number:       Current Level of Care: Hospital Recommended Level of Care: [place] Prior Approval Number:    Date Approved/Denied:   PASRR Number:   [id number]  Discharge Plan: SNF    Current Diagnoses: Patient Active Problem List   Diagnosis Date Noted  . Hx of total knee arthroplasty, right 12/04/2017  . Atypical chest pain   . Mild cognitive impairment with memory loss 04/29/2013  . Cervical spondylosis with radiculopathy 12/11/2011  . FECAL OCCULT BLOOD 02/18/2009  . ESOPHAGEAL STRICTURE 02/16/2009  . GERD 02/16/2009  . MENOPAUSE, SURGICAL 02/24/2008  . SKIN CANCER, HX OF 02/24/2008  . Personal history of other diseases of digestive system 02/24/2008    Orientation RESPIRATION BLADDER Height & Weight     Self, Time, Situation, Place  Normal Continent Weight: 166 lb (75.3 kg) Height:  5\' 6"  (167.6 cm)  BEHAVIORAL SYMPTOMS/MOOD NEUROLOGICAL BOWEL NUTRITION STATUS      Continent Diet-Regular  AMBULATORY STATUS COMMUNICATION OF NEEDS Skin   Extensive Assist Verbally (Right Knee Incision )                       Personal Care Assistance Level of Assistance  Bathing, Feeding, Dressing Bathing Assistance: Limited assistance Feeding assistance: Independent Dressing Assistance: Limited assistance     Functional Limitations Info  Sight, Hearing, Speech Sight Info: Adequate Hearing Info: Adequate Speech Info: Adequate    SPECIAL CARE FACTORS FREQUENCY  PT (By licensed PT), OT (By licensed OT)     PT Frequency:  7x/week OT Frequency: 7x/week            Contractures Contractures Info: Not present    Additional Factors Info  Code Status, Allergies Code Status Info: Fullcode Allergies Info: Aspirin, Demerol Meperidine Hcl           Current Medications (12/05/2017):  This is the current hospital active medication list Current Facility-Administered Medications  Medication Dose Route Frequency Provider Last Rate Last Dose  . acetaminophen (TYLENOL) tablet 650 mg  650 mg Oral Q4H PRN Latanya Maudlin, MD       Or  . acetaminophen (TYLENOL) suppository 650 mg  650 mg Rectal Q4H PRN Latanya Maudlin, MD      . alum & mag hydroxide-simeth (MAALOX/MYLANTA) 200-200-20 MG/5ML suspension 30 mL  30 mL Oral Q4H PRN Latanya Maudlin, MD      . bisacodyl (DULCOLAX) EC tablet 5 mg  5 mg Oral Daily PRN Latanya Maudlin, MD      . budesonide (PULMICORT) nebulizer solution 0.25 mg  0.25 mg Nebulization BID Latanya Maudlin, MD      . bupivacaine liposome (EXPAREL) 1.3 % injection 266 mg  20 mL Infiltration Once Constable, [employer], PA-C      . celecoxib (CELEBREX) capsule 200 mg  200 mg Oral Q12H Latanya Maudlin, MD   200 mg at 12/05/17 0119  . diltiazem (CARDIZEM) tablet 30 mg  30 mg Oral TID Latanya Maudlin, MD      . ferrous sulfate tablet 325 mg  325 mg  Oral TID Penelope Galas, MD   325 mg at 12/05/17 [phone]  . HYDROcodone-acetaminophen (NORCO) 10-325 MG per tablet 2 tablet  2 tablet Oral Q4H PRN Latanya Maudlin, MD   2 tablet at 12/05/17 0519  . HYDROcodone-acetaminophen (NORCO/VICODIN) 5-325 MG per tablet 1 tablet  1 tablet Oral Q4H PRN Latanya Maudlin, MD   1 tablet at 12/05/17 0123  . HYDROmorphone (DILAUDID) injection 0.5 mg  0.5 mg Intravenous Q2H PRN Latanya Maudlin, MD   0.5 mg at 12/04/17 1942  . lactated ringers infusion   Intravenous Continuous Constable, Amber, PA-C      . lactated ringers infusion   Intravenous Continuous Latanya Maudlin, MD 20 mL/hr at 12/05/17 0500    . menthol-cetylpyridinium  (CEPACOL) lozenge 3 mg  1 lozenge Oral PRN Latanya Maudlin, MD       Or  . phenol (CHLORASEPTIC) mouth spray 1 spray  1 spray Mouth/Throat PRN Latanya Maudlin, MD      . methocarbamol (ROBAXIN) tablet 500 mg  500 mg Oral Q6H PRN Latanya Maudlin, MD   500 mg at 12/05/17 0518   Or  . methocarbamol (ROBAXIN) 500 mg in dextrose 5 % 50 mL IVPB  500 mg Intravenous Q6H PRN Latanya Maudlin, MD   Stopped at 12/04/17 1115  . metoprolol succinate (TOPROL-XL) 24 hr tablet 25 mg  25 mg Oral Daily Latanya Maudlin, MD      . ondansetron [employer]) tablet 4 mg  4 mg Oral Q6H PRN Latanya Maudlin, MD       Or  . ondansetron (ZOFRAN) injection 4 mg  4 mg Intravenous Q6H PRN Latanya Maudlin, MD      . pantoprazole (PROTONIX) EC tablet 40 mg  40 mg Oral Daily Gioffre, Ronald, MD      . polyethylene glycol (MIRALAX / GLYCOLAX) packet 17 g  17 g Oral Daily PRN Latanya Maudlin, MD      . rivaroxaban [employer]) tablet 10 mg  10 mg Oral Q breakfast Latanya Maudlin, MD   10 mg at 12/05/17 9758  . sodium phosphate (FLEET) 7-19 GM/118ML enema 1 enema  1 enema Rectal Once PRN Latanya Maudlin, MD         Discharge Medications: Please see discharge summary for a list of discharge medications.  Relevant Imaging Results:  Relevant Lab Results:   Additional Information SSN: [id number]  Lia Hopping, LCSW

## 2017-12-05 NOTE — Progress Notes (Signed)
   Subjective: 1 Day Post-Op Procedure(s) (LRB): RIGHT TOTAL KNEE ARTHROPLASTY (Right) Patient reports pain as mild.   Patient seen in rounds for Dr. Gladstone Lighter. Patient is well, and has had no acute complaints or problems. Denies dizziness. Felt a little lightheaded when she first sat up. No chest pain or SOB. Foley still in place. Positive flatus. Has concerns about DC plan. We will start therapy today.    Objective: Vital signs in last 24 hours: Temp:  [97.2 F (36.2 C)-98 F (36.7 C)] 97.7 F (36.5 C) (12/06 0529) Pulse Rate:  [62-78] 64 (12/06 0529) Resp:  [11-18] 16 (12/06 0529) BP: (101-136)/(52-71) 104/53 (12/06 0529) SpO2:  [95 %-100 %] 98 % (12/06 0529)  Intake/Output from previous day:  Intake/Output Summary (Last 24 hours) at 12/05/2017 0747 Last data filed at 12/05/2017 0530 Gross per 24 hour  Intake 4343.33 ml  Output 4130 ml  Net 213.33 ml     Labs: Recent Labs    12/03/17 1016 12/05/17 0508  HGB 11.9* 10.7*   Recent Labs    12/03/17 1016 12/05/17 0508  WBC 7.4 13.7*  RBC 4.16 3.73*  HCT 37.1 33.5*  PLT 260 240   Recent Labs    12/03/17 1016 12/05/17 0508  NA 140 139  K 3.8 4.0  CL 105 104  CO2 28 30  BUN 16 12  CREATININE 0.59 0.59  GLUCOSE 99 121*  CALCIUM 9.0 8.6*    EXAM General - Patient is Alert and Oriented Extremity - Neurologically intact Intact pulses distally Dorsiflexion/Plantar flexion intact No cellulitis present Compartment soft Dressing - no drainage Motor Function - intact, moving foot and toes well on exam.  Hemovac pulled without difficulty.  Past Medical History:  Diagnosis Date  . Ankle fracture, left 2005  . Cancer ([place])    basal cell ca removed from arm  . Complaints of memory disturbance    evaluated by Dr  Marijean Bravo  . Esophageal stricture   . Esophagitis   . Family history of adverse reaction to anesthesia    MOTHER ALWAYS HAS N/V WITH SURGERY   . GERD (gastroesophageal reflux disease)   . History of  environmental allergies    GETS HIVES OCCASIONALLY WHEN NOT USING JUICE PLUS PILLS   . PAC (premature atrial contraction)   . Palpitations   . Pre-diabetes    DENIES ; SEE HGBA1C 04-25-17 EPIC     Assessment/Plan: 1 Day Post-Op Procedure(s) (LRB): RIGHT TOTAL KNEE ARTHROPLASTY (Right) Active Problems:   Hx of total knee arthroplasty, right  Estimated body mass index is 26.79 kg/m as calculated from the following:   Height as of this encounter: 5\' 6"  (1.676 m).   Weight as of this encounter: 75.3 kg (166 lb). Advance diet Up with therapy D/C IV fluids if BP holds steady after therapy. May require bolus of fluid  DVT Prophylaxis - Xarelto Weight-Bearing as tolerated   Will have her get up with therapy today. Plan for DC tomorrow. Will need SNF placement due to weather affecting care taker plans.   Ardeen Jourdain, PA-C Orthopaedic Surgery 12/05/2017, 7:47 AM

## 2017-12-05 NOTE — Plan of Care (Signed)
Plan of care discussed with the patient.  Verbalizes agreement with and understanding of plan.

## 2017-12-05 NOTE — Progress Notes (Signed)
Plan for d/c to SNF, discharge planning per CSW. [phone] 

## 2017-12-05 NOTE — Evaluation (Signed)
Occupational Therapy Evaluation Patient Details Name: [name] [name] MRN: [id number] DOB: [birth year] Today's Date: 12/05/2017    History of Present Illness Pt s/p R TKR and with hx of multiple spinal fusions   Clinical Impression   Pt is s/p TKA resulting in the deficits listed below (see OT Problem List). Pt will benefit from skilled OT to increase their safety and independence with ADL and functional mobility for ADL to facilitate discharge to venue listed below.       Follow Up Recommendations  SNF - pt lives alone and has no A. Would benefit from [facility] SNF  Equipment Recommendations  Other (comment)       Precautions / Restrictions Precautions Precautions: Knee;Fall Required Braces or Orthoses: Knee Immobilizer - Right Knee Immobilizer - Right: Discontinue once straight leg raise with < 10 degree lag Restrictions Weight Bearing Restrictions: No Other Position/Activity Restrictions: WBAT      Mobility Bed Mobility               General bed mobility comments: pt in chair  Transfers Overall transfer level: Needs assistance Equipment used: Rolling walker (2 wheeled) Transfers: Sit to/from [facility] Sit to Stand: Min assist Stand pivot transfers: Min assist       General transfer comment: cues for LE management and use of UEs to self assist        ADL either performed or assessed with clinical judgement   ADL Overall ADL's : Needs assistance/impaired Eating/Feeding: Set up;Sitting   Grooming: Min guard;Standing   Upper Body Bathing: Set up;Sitting   Lower Body Bathing: Sit to/from stand;Cueing for safety;Cueing for sequencing;Minimal assistance   Upper Body Dressing : Set up;Sitting   Lower Body Dressing: Minimal assistance;Sit to/from stand;Cueing for sequencing       Toileting- Clothing Manipulation and Hygiene: Minimal assistance;Sit to/from stand;Cueing for sequencing;Cueing for safety       Functional mobility during ADLs:  Minimal assistance;Rolling walker       Vision Patient Visual Report: No change from baseline              Pertinent Vitals/Pain Pain Score: 4  Pain Location: R knee Pain Descriptors / Indicators: Aching;Sore Pain Intervention(s): Limited activity within patient's tolerance;Monitored during session;Repositioned;Ice applied           Research officer, trade union: No difficulties   Cognition Arousal/Alertness: Awake/alert Behavior During Therapy: WFL for tasks assessed/performed Overall Cognitive Status: Within Functional Limits for tasks assessed                                                Home Living Family/patient expects to be discharged to:: Private residence Living Arrangements: Alone Available Help at Discharge: Family Type of Home: House Home Access: Stairs to enter Technical brewer of Steps: 2 Entrance Stairs-Rails: None Home Layout: One level               Home Equipment: Environmental consultant - 2 wheels          Prior Functioning/Environment Level of Independence: Independent                 OT Problem List: Decreased strength;Decreased activity tolerance;Decreased knowledge of use of DME or AE;Pain      OT Treatment/Interventions: Self-care/ADL training;Patient/family education;DME and/or AE instruction    OT Goals(Current goals can be found in the care plan section)  Acute Rehab OT Goals Patient Stated Goal: Regain IND and walk without pain OT Goal Formulation: With patient Time For Goal Achievement: 12/19/17  OT Frequency: Min 2X/week   Barriers to D/C: Decreased caregiver support          Co-evaluation              AM-PAC PT "6 Clicks" Daily Activity     Outcome Measure Help from another person eating meals?: None Help from another person taking care of personal grooming?: A Little Help from another person toileting, which includes using toliet, bedpan, or urinal?: A Little Help from another  person bathing (including washing, rinsing, drying)?: A Little Help from another person to put on and taking off regular upper body clothing?: None Help from another person to put on and taking off regular lower body clothing?: A Little 6 Click Score: 20   End of Session Equipment Utilized During Treatment: Rolling walker Nurse Communication: Mobility status  Activity Tolerance: Patient tolerated treatment well Patient left: in chair;with call bell/phone within reach  OT Visit Diagnosis: Unsteadiness on feet (R26.81);Muscle weakness (generalized) (M62.81);Pain Pain - part of body: Knee                Time: 1025-1050 OT Time Calculation (min): 25 min Charges:  OT General Charges $OT Visit: 1 Visit OT Evaluation $OT Eval Low Complexity: 1 Low OT Treatments $Self Care/Home Management : 8-22 mins G-Codes:     Kari Baars, OT [phone]  Payton Mccallum D 12/05/2017, 11:04 AM

## 2017-12-05 NOTE — Op Note (Signed)
[name] [name], [name]                ACCOUNT NO.:  [id number]  MEDICAL RECORD NO.:  [id number]  LOCATION:  PERIO                        FACILITY:  [facility]  PHYSICIAN:  Kipp Brood. Lavoy Bernards, M.D.DATE OF BIRTH:  09/16/1951  DATE OF PROCEDURE:  12/04/2017 DATE OF DISCHARGE:                              OPERATIVE REPORT   SURGEON:  Kipp Brood. Gladstone Lighter, M.D.  ASSISTANT:  Ardeen Jourdain, [place]  PREOPERATIVE DIAGNOSES: 1. Primary osteoarthritis of the right knee. 2. Flexion contracture, right knee.  POSTOPERATIVE DIAGNOSES: 1. Primary osteoarthritis of the right knee. 2. Flexion contracture, right knee.  OPERATION:  Right total knee arthroplasty utilizing the DePuy system.  I utilized gentamicin in the cement.  All three components were cemented. The size of the patella was a size 35.  The femoral component was a size 3 right posterior cruciate sacrificing-type prosthesis.  The tibial tray was a size 2.5, the insert was a polyethylene rotating platform, size 3, 12.5 mm thickness.  DESCRIPTION OF PROCEDURE:  Under general anesthesia and after an adductor block by Anesthesia, a routine orthopedic prep and draping of the right lower extremity was carried out, the appropriate time-out was carried out.  I also marked the appropriate right leg in the holding area.  At this time, the patient had 2 g of IV Ancef in the operating room.  After the prep and draping, and time-out, we then utilized the Esmarch to exsanguinate the right lower extremity and the tourniquet was elevated to 300 mmHg.  Following that, with the knee, the leg was then placed in a [facility] knee holder.  The knee was flexed and an anterior approach to the knee was carried out on the right.  Two flaps were created.  Following that, I carried out a median parapatellar incision. The patella then was reflected laterally.  I did medial and lateral meniscectomies and excised the anterior and posterior cruciate ligaments.  Good control of  the bleeding was carried out.  At this time, we then made our initial drill hole in the intercondylar notch.  The guide rod was inserted up the femoral canal, we had an excellent fit. We then removed that, irrigated out the canal.  Following that, we then removed 11-mm thickness off the distal femur with the appropriate guide. We then measured the distal femur to be a size 3.  We did anterior, posterior and chamfering cuts for a size 3 femoral component.  At this particular time, we then addressed the tibia.  We removed the spinous processes of tibial plateau.  We then measured the tibial tray to be a size 2.5.  The external guide was utilized.  We removed the appropriate amount of bone from the tibial plateau at this time.  The lamina spreaders were inserted.  We removed the posterior spurs from the femoral condyle.  We then completed our meniscectomies.  After this, we then utilized our spacer blocks.  Following this, we then addressed the remaining part of the tibial plateau.  We did our keel cut in the usual fashion.  We then did our notch cut out of the distal femur.  We then irrigated out the knee and then inserted our trial components.  We then with the knee in extension and with the trial components in place, we did a resurfacing procedure on the patella for size 35 patella.  Three drill holes then were made in the articular surface of the patella.  All trial components were removed.  The knee was thoroughly water picked out.  We then dried the knee out and cemented all three components in simultaneously with gentamicin in the cement.  After the cement was hardened, all loose pieces of cement were removed.  We then went through trials again for the tibial insert and we felt that the 12.5-mm insert was our best fit, so we did utilize the polyethylene rotating platform size 3, 12.5-mm insert and reduced the knee in usual fashion.  At this particular time, we had excellent function of  the knee.  We had good flexion, good extension and good stability.  We then closed the wound layers in usual fashion over Hemovac drain after we irrigated again with antibiotic solution.  The remaining part of the wound was closed in usual fashion.  Sterile dressings were applied.  She will be admitted to the hospital, maintained on the appropriate anticoagulation, etc.  We did inject 20 mL of Exparel with 20 mL of normal saline at the end of the procedure.          ______________________________ Kipp Brood. Gladstone Lighter, M.D.     RAG/MEDQ  D:  12/04/2017  T:  12/05/2017  Job:  [id number]

## 2017-12-05 NOTE — Progress Notes (Signed)
Physical Therapy Treatment Patient Details Name: [name] [name] MRN: [id number] DOB: 1951/07/29 Today's Date: 12/05/2017    History of Present Illness Pt s/p R TKR and with hx of multiple spinal fusions    PT Comments    Pt very motivated and progressing with mobility.  Pt lives alone with very limited assistance available and would benefit from follow up rehab at SNF level to maximize IND and safety.   Follow Up Recommendations  SNF     Equipment Recommendations  None recommended by PT    Recommendations for Other Services OT consult     Precautions / Restrictions Precautions Precautions: Knee;Fall Required Braces or Orthoses: Knee Immobilizer - Right Knee Immobilizer - Right: Discontinue once straight leg raise with < 10 degree lag Restrictions Weight Bearing Restrictions: No Other Position/Activity Restrictions: WBAT    Mobility  Bed Mobility Overal bed mobility: Needs Assistance Bed Mobility: Supine to Sit     Supine to sit: Min assist     General bed mobility comments: Cues for sequence and use of L LE to self assist  Transfers Overall transfer level: Needs assistance Equipment used: Rolling walker (2 wheeled) Transfers: Sit to/from Stand Sit to Stand: Min assist Stand pivot transfers: Min assist       General transfer comment: cues for LE management and use of UEs to self assist  Ambulation/Gait Ambulation/Gait assistance: Min assist Ambulation Distance (Feet): 58 Feet Assistive device: Rolling walker (2 wheeled) Gait Pattern/deviations: Step-to pattern;Decreased step length - right;Decreased step length - left;Shuffle;Trunk flexed Gait velocity: decr Gait velocity interpretation: Below normal speed for age/gender General Gait Details: cues for sequence, posture and position from [employer]            Wheelchair Mobility    Modified Rankin (Stroke Patients Only)       Balance Overall balance assessment: No apparent balance deficits  (not formally assessed)                                          Cognition Arousal/Alertness: Awake/alert Behavior During Therapy: WFL for tasks assessed/performed Overall Cognitive Status: Within Functional Limits for tasks assessed                                        Exercises Total Joint Exercises Ankle Circles/Pumps: AROM;Both;15 reps;Supine Quad Sets: AROM;Both;10 reps;Supine Heel Slides: AAROM;Right;15 reps;Supine Straight Leg Raises: AAROM;AROM;Right;15 reps;Supine    General Comments        Pertinent Vitals/Pain Pain Assessment: 0-10 Pain Score: 5  Pain Location: R knee Pain Descriptors / Indicators: Aching;Sore Pain Intervention(s): Limited activity within patient's tolerance;Monitored during session;Premedicated before session;Ice applied    Home Living Family/patient expects to be discharged to:: Private residence Living Arrangements: Alone Available Help at Discharge: Family Type of Home: House Home Access: Stairs to enter Entrance Stairs-Rails: None Home Layout: One level Home Equipment: Environmental consultant - 2 wheels      Prior Function Level of Independence: Independent          PT Goals (current goals can now be found in the care plan section) Acute Rehab PT Goals Patient Stated Goal: Regain IND and walk without pain PT Goal Formulation: With patient Time For Goal Achievement: 12/16/17 Potential to Achieve Goals: Good Progress towards PT goals: Progressing toward goals  Frequency    7X/week      PT Plan Discharge plan needs to be updated    Co-evaluation              AM-PAC PT "6 Clicks" Daily Activity  Outcome Measure  Difficulty turning over in bed (including adjusting bedclothes, sheets and blankets)?: Unable Difficulty moving from lying on back to sitting on the side of the bed? : Unable Difficulty sitting down on and standing up from a chair with arms (e.g., wheelchair, bedside commode, etc,.)?:  Unable Help needed moving to and from a bed to chair (including a wheelchair)?: A Little Help needed walking in hospital room?: A Little Help needed climbing 3-5 steps with a railing? : A Little 6 Click Score: 12    End of Session Equipment Utilized During Treatment: Gait belt Activity Tolerance: Patient tolerated treatment well Patient left: in chair;with call bell/phone within reach;with chair alarm set Nurse Communication: Mobility status PT Visit Diagnosis: Difficulty in walking, not elsewhere classified (R26.2)     Time: 2458-0998 PT Time Calculation (min) (ACUTE ONLY): 32 min  Charges:  $Gait Training: 8-22 mins $Therapeutic Exercise: 8-22 mins                    G Codes:       Pg [phone]    Jordell Outten 12/05/2017, 12:09 PM

## 2017-12-06 LAB — BASIC METABOLIC PANEL
Anion gap: 4 — ABNORMAL LOW (ref 5–15)
BUN: 15 mg/dL (ref 6–20)
CHLORIDE: 106 mmol/L (ref 101–111)
CO2: 32 mmol/L (ref 22–32)
Calcium: 8.5 mg/dL — ABNORMAL LOW (ref 8.9–10.3)
Creatinine, Ser: 0.76 mg/dL (ref 0.44–1.00)
GFR calc non Af Amer: >60 mL/min (ref >60)
Glucose, Bld: 92 mg/dL (ref 65–99)
POTASSIUM: 4.2 mmol/L (ref 3.5–5.1)
SODIUM: 142 mmol/L (ref 135–145)

## 2017-12-06 LAB — CBC
HCT: 33.5 % — ABNORMAL LOW (ref 36.0–46.0)
HEMOGLOBIN: 10.6 g/dL — AB (ref 12.0–15.0)
MCH: 28.7 pg (ref 26.0–34.0)
MCHC: 31.6 g/dL (ref 30.0–36.0)
MCV: 90.8 fL (ref 78.0–100.0)
Platelets: 228 10*3/uL (ref 150–400)
RBC: 3.69 MIL/uL — AB (ref 3.87–5.11)
RDW: 15.5 % (ref 11.5–15.5)
WBC: 11 10*3/uL — ABNORMAL HIGH (ref 4.0–10.5)

## 2017-12-06 MED ORDER — TRAMADOL HCL 50 MG PO TABS: 50.0000 mg | ORAL_TABLET | Freq: Four times a day (QID) | ORAL | 0 refills | Status: DC | PRN

## 2017-12-06 MED ORDER — FERROUS SULFATE 325 (65 FE) MG PO TABS: 325.0000 mg | ORAL_TABLET | Freq: Two times a day (BID) | ORAL | 0 refills | Status: DC

## 2017-12-06 MED ORDER — RIVAROXABAN 10 MG PO TABS: 10.0000 mg | ORAL_TABLET | Freq: Every day | ORAL | 0 refills | Status: DC

## 2017-12-06 MED ORDER — METHOCARBAMOL 500 MG PO TABS: 500.0000 mg | ORAL_TABLET | Freq: Four times a day (QID) | ORAL | 1 refills | Status: DC | PRN

## 2017-12-06 MED ORDER — POLYETHYLENE GLYCOL 3350 17 G PO PACK: 17.0000 g | PACK | Freq: Every day | ORAL | 0 refills | Status: DC | PRN

## 2017-12-06 MED ORDER — DOCUSATE SODIUM 100 MG PO CAPS: 100.0000 mg | ORAL_CAPSULE | Freq: Two times a day (BID) | ORAL | 0 refills | Status: DC

## 2017-12-06 MED ORDER — HYDROCODONE-ACETAMINOPHEN 5-325 MG PO TABS: 1.0000 | ORAL_TABLET | Freq: Four times a day (QID) | ORAL | 0 refills | Status: DC | PRN

## 2017-12-06 MED ORDER — DOCUSATE SODIUM 100 MG PO CAPS
100.0000 mg | ORAL_CAPSULE | Freq: Two times a day (BID) | ORAL | Status: DC
  Administered 2017-12-06: 10:00:00 100 mg via ORAL
  Filled 2017-12-06: qty 1

## 2017-12-06 NOTE — Progress Notes (Signed)
Physical Therapy Treatment Patient Details Name: [name] Stone MRN: [id number] DOB: 01-Aug-1951 Today's Date: 12/06/2017    History of Present Illness Pt s/p R TKR and with hx of multiple spinal fusions    PT Comments    Pt continues to be very motivated and making steady progress with mobility.   Follow Up Recommendations  SNF     Equipment Recommendations  None recommended by PT    Recommendations for Other Services OT consult     Precautions / Restrictions Precautions Precautions: Knee;Fall Required Braces or Orthoses: Knee Immobilizer - Right Knee Immobilizer - Right: Discontinue once straight leg raise with < 10 degree lag Restrictions Weight Bearing Restrictions: No Other Position/Activity Restrictions: WBAT    Mobility  Bed Mobility Overal bed mobility: Needs Assistance Bed Mobility: Sit to Supine       Sit to supine: Min assist   General bed mobility comments: Cues for sequence and use of L LE to self assist  Transfers Overall transfer level: Needs assistance Equipment used: Rolling walker (2 wheeled) Transfers: Sit to/from Stand Sit to Stand: Min guard         General transfer comment: cues for LE management and use of UEs to self assist  Ambulation/Gait Ambulation/Gait assistance: Min guard Ambulation Distance (Feet): 100 Feet Assistive device: Rolling walker (2 wheeled) Gait Pattern/deviations: Step-to pattern;Decreased step length - right;Decreased step length - left;Shuffle;Trunk flexed Gait velocity: decr Gait velocity interpretation: Below normal speed for age/gender General Gait Details: min cues for sequence, posture and position from [employer]            Wheelchair Mobility    Modified Rankin (Stroke Patients Only)       Balance Overall balance assessment: No apparent balance deficits (not formally assessed)                                          Cognition Arousal/Alertness: Awake/alert Behavior  During Therapy: WFL for tasks assessed/performed Overall Cognitive Status: Within Functional Limits for tasks assessed                                        Exercises Total Joint Exercises Ankle Circles/Pumps: AROM;Both;15 reps;Supine Quad Sets: AROM;Both;Supine;20 reps Heel Slides: AAROM;Right;15 reps;Supine Straight Leg Raises: AAROM;AROM;Right;Supine;20 reps    General Comments        Pertinent Vitals/Pain Pain Assessment: 0-10 Pain Score: 7  Pain Location: R knee Pain Descriptors / Indicators: Aching;Sore Pain Intervention(s): Monitored during session;Premedicated before session;Limited activity within patient's tolerance;Ice applied    Home Living                      Prior Function            PT Goals (current goals can now be found in the care plan section) Acute Rehab PT Goals Patient Stated Goal: Regain IND and walk without pain PT Goal Formulation: With patient Time For Goal Achievement: 12/16/17 Potential to Achieve Goals: Good Progress towards PT goals: Progressing toward goals    Frequency    7X/week      PT Plan Current plan remains appropriate    Co-evaluation              AM-PAC PT "6 Clicks" Daily Activity  Outcome Measure  Difficulty turning over in bed (including adjusting bedclothes, sheets and blankets)?: Unable Difficulty moving from lying on back to sitting on the side of the bed? : Unable Difficulty sitting down on and standing up from a chair with arms (e.g., wheelchair, bedside commode, etc,.)?: Unable Help needed moving to and from a bed to chair (including a wheelchair)?: A Little Help needed walking in hospital room?: A Little Help needed climbing 3-5 steps with a railing? : A Little 6 Click Score: 12    End of Session Equipment Utilized During Treatment: Gait belt Activity Tolerance: Patient tolerated treatment well Patient left: in bed;with call bell/phone within reach Nurse Communication:  Mobility status PT Visit Diagnosis: Difficulty in walking, not elsewhere classified (R26.2)     Time: 1202-1228 PT Time Calculation (min) (ACUTE ONLY): 26 min  Charges:  $Gait Training: 8-22 mins $Therapeutic Exercise: 8-22 mins                    G Codes:       Pg [phone]    Shawna Wearing 12/06/2017, 12:41 PM

## 2017-12-06 NOTE — Discharge Summary (Signed)
Physician Discharge Summary   Patient ID: [name] Stone MRN: [id number] DOB/AGE: 1951/11/19 66 y.o.  Admit date: 12/04/2017 Discharge date: 12/06/2017  Primary Diagnosis: Primary osteoarthritis right knee   Admission Diagnoses:  Past Medical History:  Diagnosis Date  . Ankle fracture, left 2005  . Cancer ([place])    basal cell ca removed from arm  . Complaints of memory disturbance    evaluated by Dr  Marijean Bravo  . Esophageal stricture   . Esophagitis   . Family history of adverse reaction to anesthesia    MOTHER ALWAYS HAS N/V WITH SURGERY   . GERD (gastroesophageal reflux disease)   . History of environmental allergies    GETS HIVES OCCASIONALLY WHEN NOT USING JUICE PLUS PILLS   . PAC (premature atrial contraction)   . Palpitations   . Pre-diabetes    DENIES ; SEE HGBA1C 04-25-17 EPIC    Discharge Diagnoses:   Active Problems:   Hx of total knee arthroplasty, right  Estimated body mass index is 26.79 kg/m as calculated from the following:   Height as of this encounter: '5\' 6"'$  (1.676 m).   Weight as of this encounter: 75.3 kg (166 lb).  Procedure:  Procedure(s) (LRB): RIGHT TOTAL KNEE ARTHROPLASTY (Right)   Consults: None  HPI: [name] Stone, 66 y.o. female, has a history of pain and functional disability in the right knee due to arthritis and has failed non-surgical conservative treatments for greater than 12 weeks to includeNSAID's and/or analgesics, corticosteriod injections, viscosupplementation injections, flexibility and strengthening excercises and activity modification.  Onset of symptoms was gradual, starting 5 years ago with gradually worsening course since that time. The patient noted prior procedures on the knee to include  arthroscopy and menisectomy on the right knee(s).  Patient currently rates pain in the right knee(s) at 8 out of 10 with activity. Patient has night pain, worsening of pain with activity and weight bearing, pain that interferes with activities of  daily living, pain with passive range of motion, crepitus and joint swelling.  Patient has evidence of periarticular osteophytes and joint space narrowing by imaging studies. There is no active infection.   Laboratory Data: Admission on 12/04/2017  Component Date Value Ref Range Status  . WBC 12/05/2017 13.7* 4.0 - 10.5 K/uL Final  . RBC 12/05/2017 3.73* 3.87 - 5.11 MIL/uL Final  . Hemoglobin 12/05/2017 10.7* 12.0 - 15.0 g/dL Final  . HCT 12/05/2017 33.5* 36.0 - 46.0 % Final  . MCV 12/05/2017 89.8  78.0 - 100.0 fL Final  . MCH 12/05/2017 28.7  26.0 - 34.0 pg Final  . MCHC 12/05/2017 31.9  30.0 - 36.0 g/dL Final  . RDW 12/05/2017 15.2  11.5 - 15.5 % Final  . Platelets 12/05/2017 240  150 - 400 K/uL Final  . Sodium 12/05/2017 139  135 - 145 mmol/L Final  . Potassium 12/05/2017 4.0  3.5 - 5.1 mmol/L Final  . Chloride 12/05/2017 104  101 - 111 mmol/L Final  . CO2 12/05/2017 30  22 - 32 mmol/L Final  . Glucose, Bld 12/05/2017 121* 65 - 99 mg/dL Final  . BUN 12/05/2017 12  6 - 20 mg/dL Final  . Creatinine, Ser 12/05/2017 0.59  0.44 - 1.00 mg/dL Final  . Calcium 12/05/2017 8.6* 8.9 - 10.3 mg/dL Final  . GFR calc non Af Amer 12/05/2017 >60  >60 mL/min Final  . GFR calc Af Amer 12/05/2017 >60  >60 mL/min Final   Comment: (NOTE) The eGFR has been calculated using the  CKD EPI equation. This calculation has not been validated in all clinical situations. eGFR's persistently <60 mL/min signify possible Chronic Kidney Disease.   . Anion gap 12/05/2017 5  5 - 15 Final  . WBC 12/06/2017 11.0* 4.0 - 10.5 K/uL Final  . RBC 12/06/2017 3.69* 3.87 - 5.11 MIL/uL Final  . Hemoglobin 12/06/2017 10.6* 12.0 - 15.0 g/dL Final  . HCT 12/06/2017 33.5* 36.0 - 46.0 % Final  . MCV 12/06/2017 90.8  78.0 - 100.0 fL Final  . MCH 12/06/2017 28.7  26.0 - 34.0 pg Final  . MCHC 12/06/2017 31.6  30.0 - 36.0 g/dL Final  . RDW 12/06/2017 15.5  11.5 - 15.5 % Final  . Platelets 12/06/2017 228  150 - 400 K/uL Final  .  Sodium 12/06/2017 142  135 - 145 mmol/L Final  . Potassium 12/06/2017 4.2  3.5 - 5.1 mmol/L Final  . Chloride 12/06/2017 106  101 - 111 mmol/L Final  . CO2 12/06/2017 32  22 - 32 mmol/L Final  . Glucose, Bld 12/06/2017 92  65 - 99 mg/dL Final  . BUN 12/06/2017 15  6 - 20 mg/dL Final  . Creatinine, Ser 12/06/2017 0.76  0.44 - 1.00 mg/dL Final  . Calcium 12/06/2017 8.5* 8.9 - 10.3 mg/dL Final  . GFR calc non Af Amer 12/06/2017 >60  >60 mL/min Final  . GFR calc Af Amer 12/06/2017 >60  >60 mL/min Final   Comment: (NOTE) The eGFR has been calculated using the CKD EPI equation. This calculation has not been validated in all clinical situations. eGFR's persistently <60 mL/min signify possible Chronic Kidney Disease.   Georgiann Hahn gap 12/06/2017 4* 5 - 15 Final  Hospital Outpatient Visit on 12/03/2017  Component Date Value Ref Range Status  . Sodium 12/03/2017 140  135 - 145 mmol/L Final  . Potassium 12/03/2017 3.8  3.5 - 5.1 mmol/L Final  . Chloride 12/03/2017 105  101 - 111 mmol/L Final  . CO2 12/03/2017 28  22 - 32 mmol/L Final  . Glucose, Bld 12/03/2017 99  65 - 99 mg/dL Final  . BUN 12/03/2017 16  6 - 20 mg/dL Final  . Creatinine, Ser 12/03/2017 0.59  0.44 - 1.00 mg/dL Final  . Calcium 12/03/2017 9.0  8.9 - 10.3 mg/dL Final  . GFR calc non Af Amer 12/03/2017 >60  >60 mL/min Final  . GFR calc Af Amer 12/03/2017 >60  >60 mL/min Final   Comment: (NOTE) The eGFR has been calculated using the CKD EPI equation. This calculation has not been validated in all clinical situations. eGFR's persistently <60 mL/min signify possible Chronic Kidney Disease.   . Anion gap 12/03/2017 7  5 - 15 Final  . WBC 12/03/2017 7.4  4.0 - 10.5 K/uL Final  . RBC 12/03/2017 4.16  3.87 - 5.11 MIL/uL Final  . Hemoglobin 12/03/2017 11.9* 12.0 - 15.0 g/dL Final  . HCT 12/03/2017 37.1  36.0 - 46.0 % Final  . MCV 12/03/2017 89.2  78.0 - 100.0 fL Final  . MCH 12/03/2017 28.6  26.0 - 34.0 pg Final  . MCHC 12/03/2017  32.1  30.0 - 36.0 g/dL Final  . RDW 12/03/2017 15.3  11.5 - 15.5 % Final  . Platelets 12/03/2017 260  150 - 400 K/uL Final  . Hgb A1c MFr Bld 12/03/2017 6.0* 4.8 - 5.6 % Final   Comment: (NOTE) Pre diabetes:          5.7%-6.4% Diabetes:              >  6.4% Glycemic control for   <7.0% adults with diabetes   . Mean Plasma Glucose 12/03/2017 125.5  mg/dL Final   Performed at [facility] [address], [place], [place] [address]  . MRSA, PCR 12/03/2017 NEGATIVE  NEGATIVE Final  . Staphylococcus aureus 12/03/2017 NEGATIVE  NEGATIVE Final   Comment: (NOTE) The Xpert SA Assay (FDA approved for NASAL specimens in patients 89 years of age and older), is one component of a comprehensive surveillance program. It is not intended to diagnose infection nor to guide or monitor treatment.   . ABO/RH(D) 12/03/2017 B POS   Final  . Antibody Screen 12/03/2017 NEG   Final  . Sample Expiration 12/03/2017 12/07/2017   Final  . Extend sample reason 12/03/2017 NO TRANSFUSIONS OR PREGNANCY IN THE PAST 3 MONTHS   Final  . ABO/RH(D) 12/03/2017 B POS   Final     X-Rays:Dg Chest 2 View  Result Date: 12/03/2017 CLINICAL DATA:  Knee replacement. EXAM: CHEST  2 VIEW COMPARISON:  07/06/2016. FINDINGS: Mediastinum and hilar structures normal. Mild left base atelectasis. No pleural effusion or pneumothorax. Heart size normal. Prior cervicothoracic spine fusion. IMPRESSION: Mild left base atelectasis. Electronically Signed   By: Marcello Moores  Register   On: 12/03/2017 11:11    EKG: Orders placed or performed during the hospital encounter of 12/03/17  . EKG  . EKG     Hospital Course: [name] Stone is a 66 y.o. who was admitted to [facility]. They were brought to the operating room on 12/04/2017 and underwent Procedure(s): RIGHT TOTAL KNEE ARTHROPLASTY.  Patient tolerated the procedure well and was later transferred to the recovery room and then to the orthopaedic floor for postoperative care.  They  were given PO and IV analgesics for pain control following their surgery.  They were given 24 hours of postoperative antibiotics of  Anti-infectives (From admission, onward)   Start     Dose/Rate Route Frequency Ordered Stop   12/04/17 1500  ceFAZolin (ANCEF) IVPB 1 g/50 mL premix     1 g 100 mL/hr over 30 Minutes Intravenous Every 6 hours 12/04/17 1203 12/04/17 2151   12/04/17 0914  polymyxin B 500,000 Units, bacitracin 50,000 Units in sodium chloride 0.9 % 500 mL irrigation  Status:  Discontinued       As needed 12/04/17 0914 12/04/17 1041   12/04/17 0748  ceFAZolin (ANCEF) 2-4 GM/100ML-% IVPB    Comments:  Carleene Cooper   : cabinet override      12/04/17 0748 12/04/17 0846   12/04/17 0622  ceFAZolin (ANCEF) IVPB 2g/100 mL premix     2 g 200 mL/hr over 30 Minutes Intravenous On call to O.R. 12/04/17 5573 12/04/17 0906     and started on DVT prophylaxis in the form of Xarelto.   PT and OT were ordered for total joint protocol.  Discharge planning consulted to help with postop disposition and equipment needs.  Patient had a fair night on the evening of surgery.  They started to get up OOB with therapy on day one. Hemovac drain was pulled without difficulty.  Continued to work with therapy into day two.  Dressing was changed on day two and the incision was clean and dry.  The patient had progressed with therapy and meeting their goals.  Incision was healing well.  Patient was seen in rounds and was ready to go to SNF.   Diet: Cardiac diet Activity:WBAT Follow-up:in 2 weeks Disposition - Skilled nursing facility Discharged Condition:  stable   Discharge Instructions    Call MD / Call 911   Complete by:  As directed    If you experience chest pain or shortness of breath, CALL 911 and be transported to the hospital emergency room.  If you develope a fever above 101 F, pus (white drainage) or increased drainage or redness at the wound, or calf pain, call your surgeon's office.    Constipation Prevention   Complete by:  As directed    Drink plenty of fluids.  Prune juice may be helpful.  You may use a stool softener, such as Colace (over the counter) 100 mg twice a day.  Use MiraLax (over the counter) for constipation as needed.   Diet - low sodium heart healthy   Complete by:  As directed    Discharge instructions   Complete by:  As directed    INSTRUCTIONS AFTER JOINT REPLACEMENT   Remove items at home which could result in a fall. This includes throw rugs or furniture in walking pathways ICE to the affected joint every three hours while awake for 30 minutes at a time, for at least the first 3-5 days, and then as needed for pain and swelling.  Continue to use ice for pain and swelling. You may notice swelling that will progress down to the foot and ankle.  This is normal after surgery.  Elevate your leg when you are not up walking on it.   Continue to use the breathing machine you got in the hospital (incentive spirometer) which will help keep your temperature down.  It is common for your temperature to cycle up and down following surgery, especially at night when you are not up moving around and exerting yourself.  The breathing machine keeps your lungs expanded and your temperature down.   DIET:  As you were doing prior to hospitalization, we recommend a well-balanced diet.  DRESSING / WOUND CARE / SHOWERING  You may change your dressing every day with sterile gauze.  Please use good hand washing techniques before changing the dressing.  Do not use any lotions or creams on the incision until instructed by your surgeon.  ACTIVITY  Increase activity slowly as tolerated, but follow the weight bearing instructions below.   No driving for 6 weeks or until further direction given by your physician.  You cannot drive while taking narcotics.  No lifting or carrying greater than 10 lbs. until further directed by your surgeon. Avoid periods of inactivity such as sitting  longer than an hour when not asleep. This helps prevent blood clots.  You may return to work once you are authorized by your doctor.     WEIGHT BEARING   Weight bearing as tolerated with assist device (walker, cane, etc) as directed, use it as long as suggested by your surgeon or therapist, typically at least 4-6 weeks.   EXERCISES  Results after joint replacement surgery are often greatly improved when you follow the exercise, range of motion and muscle strengthening exercises prescribed by your doctor. Safety measures are also important to protect the joint from further injury. Any time any of these exercises cause you to have increased pain or swelling, decrease what you are doing until you are comfortable again and then slowly increase them. If you have problems or questions, call your caregiver or physical therapist for advice.   Rehabilitation is important following a joint replacement. After just a few days of immobilization, the muscles of the leg can become weakened and  shrink (atrophy).  These exercises are designed to build up the tone and strength of the thigh and leg muscles and to improve motion. Often times heat used for twenty to thirty minutes before working out will loosen up your tissues and help with improving the range of motion but do not use heat for the first two weeks following surgery (sometimes heat can increase post-operative swelling).   These exercises can be done on a training (exercise) mat, on the floor, on a table or on a bed. Use whatever works the best and is most comfortable for you.    Use music or television while you are exercising so that the exercises are a pleasant break in your day. This will make your life better with the exercises acting as a break in your routine that you can look forward to.   Perform all exercises about fifteen times, three times per day or as directed.  You should exercise both the operative leg and the other leg as well.  Exercises  include:   Quad Sets - Tighten up the muscle on the front of the thigh (Quad) and hold for 5-10 seconds.   Straight Leg Raises - With your knee straight (if you were given a brace, keep it on), lift the leg to 60 degrees, hold for 3 seconds, and slowly lower the leg.  Perform this exercise against resistance later as your leg gets stronger.  Leg Slides: Lying on your back, slowly slide your foot toward your buttocks, bending your knee up off the floor (only go as far as is comfortable). Then slowly slide your foot back down until your leg is flat on the floor again.  Angel Wings: Lying on your back spread your legs to the side as far apart as you can without causing discomfort.  Hamstring Strength:  Lying on your back, push your heel against the floor with your leg straight by tightening up the muscles of your buttocks.  Repeat, but this time bend your knee to a comfortable angle, and push your heel against the floor.  You may put a pillow under the heel to make it more comfortable if necessary.   A rehabilitation program following joint replacement surgery can speed recovery and prevent re-injury in the future due to weakened muscles. Contact your doctor or a physical therapist for more information on knee rehabilitation.    CONSTIPATION  Constipation is defined medically as fewer than three stools per week and severe constipation as less than one stool per week.  Even if you have a regular bowel pattern at home, your normal regimen is likely to be disrupted due to multiple reasons following surgery.  Combination of anesthesia, postoperative narcotics, change in appetite and fluid intake all can affect your bowels.   YOU MUST use at least one of the following options; they are listed in order of increasing strength to get the job done.  They are all available over the counter, and you may need to use some, POSSIBLY even all of these options:    Drink plenty of fluids (prune juice may be helpful)  and high fiber foods Colace 100 mg by mouth twice a day  Senokot for constipation as directed and as needed Dulcolax (bisacodyl), take with full glass of water  Miralax (polyethylene glycol) once or twice a day as needed.  If you have tried all these things and are unable to have a bowel movement in the first 3-4 days after surgery call either your surgeon  or your primary doctor.    If you experience loose stools or diarrhea, hold the medications until you stool forms back up.  If your symptoms do not get better within 1 week or if they get worse, check with your doctor.  If you experience "the worst abdominal pain ever" or develop nausea or vomiting, please contact the office immediately for further recommendations for treatment.   ITCHING:  If you experience itching with your medications, try taking only a single pain pill, or even half a pain pill at a time.  You can also use Benadryl over the counter for itching or also to help with sleep.   TED HOSE STOCKINGS:  Use stockings on both legs until for at least 2 weeks or as directed by physician office. They may be removed at night for sleeping.  MEDICATIONS:  See your medication summary on the "After Visit Summary" that nursing will review with you.  You may have some home medications which will be placed on hold until you complete the course of blood thinner medication.  It is important for you to complete the blood thinner medication as prescribed.  PRECAUTIONS:  If you experience chest pain or shortness of breath - call 911 immediately for transfer to the hospital emergency department.   If you develop a fever greater that 101 F, purulent drainage from wound, increased redness or drainage from wound, foul odor from the wound/dressing, or calf pain - CONTACT YOUR SURGEON.                                                   FOLLOW-UP APPOINTMENTS:  If you do not already have a post-op appointment, please call the office for an appointment to be  seen by your surgeon.  Guidelines for how soon to be seen are listed in your "After Visit Summary", but are typically between 1-4 weeks after surgery.   MAKE SURE YOU:  Understand these instructions.  Get help right away if you are not doing well or get worse.    Thank you for letting us be a part of your medical care team.  It is a privilege we respect greatly.  We hope these instructions will help you stay on track for a fast and full recovery!   Increase activity slowly as tolerated   Complete by:  As directed      Allergies as of 12/06/2017      Reactions   Aspirin    REACTION: retinal hemorhage   Demerol [meperidine Hcl]    REACTION: hallucinations      Medication List    TAKE these medications   cetirizine 10 MG tablet Commonly known as:  ZYRTEC Take 10 mg by mouth 2 (two) times daily.   diltiazem 30 MG tablet Commonly known as:  CARDIZEM Take 30 mg by mouth 3 (three) times daily.   docusate sodium 100 MG capsule Commonly known as:  COLACE Take 1 capsule (100 mg total) by mouth 2 (two) times daily.   esomeprazole 40 MG capsule Commonly known as:  NEXIUM Take 40 mg by mouth daily.   estradiol 0.05 MG/24HR patch Commonly known as:  VIVELLE-DOT Place 1 patch onto the skin 2 (two) times a week.   ferrous sulfate 325 (65 FE) MG tablet Take 1 tablet (325 mg total) by mouth 2 (two) times daily  with a meal.   fluticasone 220 MCG/ACT inhaler Commonly known as:  FLOVENT HFA Use two puffs and swallow twice daily. What changed:    how much to take  how to take this  when to take this  reasons to take this  additional instructions   HYDROcodone-acetaminophen 5-325 MG tablet Commonly known as:  NORCO/VICODIN Take 1-2 tablets by mouth every 6 (six) hours as needed for moderate pain or severe pain ((score 4 to 6)).   LOTEMAX 0.5 % ophthalmic suspension Generic drug:  loteprednol INTSILL 1  DROP IN RIGHT EYE ONCE WEEKLY ON WEDNESDAYS   methocarbamol 500 MG  tablet Commonly known as:  ROBAXIN Take 1 tablet (500 mg total) by mouth every 6 (six) hours as needed for muscle spasms.   metoprolol succinate 25 MG 24 hr tablet Commonly known as:  TOPROL-XL Take 25 mg by mouth daily.   polyethylene glycol packet Commonly known as:  MIRALAX / GLYCOLAX Take 17 g by mouth daily as needed for mild constipation.   rivaroxaban 10 MG Tabs tablet Commonly known as:  XARELTO Take 1 tablet (10 mg total) by mouth daily with breakfast.   traMADol 50 MG tablet Commonly known as:  ULTRAM Take 1-2 tablets (50-100 mg total) by mouth every 6 (six) hours as needed for moderate pain (not relieved by hydrocodone).   UNABLE TO FIND Take 2 tablets by mouth daily. Juice Plus Veggie Blend   UNABLE TO FIND Take 2 tablets by mouth daily. Juice Plus Fruit Blend   UNABLE TO FIND Take 2 tablets by mouth daily. Juice Plus Vineyard Blend   UNABLE TO FIND Take 2 tablets by mouth daily. Juice Plus Omega Blend   Vitamin D 2000 units Caps Take 2,000 Units by mouth daily.      Follow-up Information    Latanya Maudlin, MD. Schedule an appointment as soon as possible for a visit in 2 week(s).   Specialty:  Orthopedic Surgery Contact information: [address] [place] [address] [phone]           Signed: Ardeen Jourdain, PA-C Orthopaedic Surgery 12/06/2017, 7:43 AM

## 2017-12-06 NOTE — Progress Notes (Signed)
Physical Therapy Treatment Patient Details Name: [name] Stone MRN: [id number] DOB: 01/30/1951 Today's Date: 12/06/2017    History of Present Illness Pt s/p R TKR and with hx of multiple spinal fusions    PT Comments    Pt continues motivated and progressing with mobility despite increased pain this am.   Follow Up Recommendations  SNF     Equipment Recommendations  None recommended by PT    Recommendations for Other Services OT consult     Precautions / Restrictions Precautions Precautions: Knee;Fall Required Braces or Orthoses: Knee Immobilizer - Right Knee Immobilizer - Right: Discontinue once straight leg raise with < 10 degree lag Restrictions Weight Bearing Restrictions: No Other Position/Activity Restrictions: WBAT    Mobility  Bed Mobility Overal bed mobility: Needs Assistance Bed Mobility: Supine to Sit     Supine to sit: Min assist     General bed mobility comments: Cues for sequence and use of L LE to self assist  Transfers Overall transfer level: Needs assistance Equipment used: Rolling walker (2 wheeled) Transfers: Sit to/from Stand Sit to Stand: Min guard         General transfer comment: cues for LE management and use of UEs to self assist  Ambulation/Gait Ambulation/Gait assistance: Min assist;Min guard Ambulation Distance (Feet): 60 Feet(twice) Assistive device: Rolling walker (2 wheeled) Gait Pattern/deviations: Step-to pattern;Decreased step length - right;Decreased step length - left;Shuffle;Trunk flexed Gait velocity: decr Gait velocity interpretation: Below normal speed for age/gender General Gait Details: cues for sequence, posture and position from [employer]            Wheelchair Mobility    Modified Rankin (Stroke Patients Only)       Balance Overall balance assessment: No apparent balance deficits (not formally assessed)                                          Cognition Arousal/Alertness:  Awake/alert Behavior During Therapy: WFL for tasks assessed/performed Overall Cognitive Status: Within Functional Limits for tasks assessed                                        Exercises Total Joint Exercises Ankle Circles/Pumps: AROM;Both;15 reps;Supine Quad Sets: AROM;Both;Supine;20 reps Heel Slides: AAROM;Right;15 reps;Supine Straight Leg Raises: AAROM;AROM;Right;Supine;20 reps Goniometric ROM: AAROM R knee -8 - 95    General Comments        Pertinent Vitals/Pain Pain Assessment: 0-10 Pain Score: 7  Pain Location: R knee Pain Descriptors / Indicators: Aching;Sore Pain Intervention(s): Limited activity within patient's tolerance;Monitored during session;Premedicated before session;Ice applied    Home Living                      Prior Function            PT Goals (current goals can now be found in the care plan section) Acute Rehab PT Goals Patient Stated Goal: Regain IND and walk without pain PT Goal Formulation: With patient Time For Goal Achievement: 12/16/17 Potential to Achieve Goals: Good Progress towards PT goals: Progressing toward goals    Frequency    7X/week      PT Plan Current plan remains appropriate    Co-evaluation              AM-PAC  PT "6 Clicks" Daily Activity  Outcome Measure  Difficulty turning over in bed (including adjusting bedclothes, sheets and blankets)?: Unable Difficulty moving from lying on back to sitting on the side of the bed? : Unable Difficulty sitting down on and standing up from a chair with arms (e.g., wheelchair, bedside commode, etc,.)?: Unable Help needed moving to and from a bed to chair (including a wheelchair)?: A Little Help needed walking in hospital room?: A Little Help needed climbing 3-5 steps with a railing? : A Little 6 Click Score: 12    End of Session Equipment Utilized During Treatment: Gait belt Activity Tolerance: Patient tolerated treatment well Patient left: in  chair;with call bell/phone within reach Nurse Communication: Mobility status PT Visit Diagnosis: Difficulty in walking, not elsewhere classified (R26.2)     Time: 3128-1188 PT Time Calculation (min) (ACUTE ONLY): 33 min  Charges:  $Gait Training: 8-22 mins $Therapeutic Exercise: 8-22 mins                    G Codes:       Pg [phone]    Domnique Vanegas 12/06/2017, 8:39 AM

## 2017-12-06 NOTE — Clinical Social Work Placement (Signed)
Patient agreeable to pay privately until insurance approves rehab. Patient niece will transport Nurse call report to: [phone] or [phone] Rm. [phone]  CLINICAL SOCIAL WORK PLACEMENT  NOTE  Date:  12/06/2017  Patient Details  Name: [name] Stone MRN: [id number] Date of Birth: 01/19/1951  Clinical Social Work is seeking post-discharge placement for this patient at the [place] level of care (*CSW will initial, date and re-position this form in  chart as items are completed):  Yes   Patient/family provided with [facility] Work Department's list of facilities offering this level of care within the geographic area requested by the patient (or if unable, by the patient's family).  Yes   Patient/family informed of their freedom to choose among providers that offer the needed level of care, that participate in Medicare, Medicaid or managed care program needed by the patient, have an available bed and are willing to accept the patient.  Yes   Patient/family informed of [facility]'s ownership interest in [facility] and [facility], as well as of the fact that they are under no obligation to receive care at these facilities.  PASRR submitted to EDS on 12/05/17     PASRR number received on 12/05/17     Existing PASRR number confirmed on       FL2 transmitted to all facilities in geographic area requested by pt/family on       FL2 transmitted to all facilities within larger geographic area on 12/06/17     Patient informed that his/her managed care company has contracts with or will negotiate with certain facilities, including the following:        Yes   Patient/family informed of bed offers received.  Patient chooses bed at     [facility]  Physician recommends and patient chooses bed at    [facility]  Patient to be transferred to   on 12/06/17.  Patient to be transferred to facility by Niece will transport.       Patient family notified on 12/06/17 of transfer.  Name of family member notified:  Patient will notify family.      PHYSICIAN       Additional Comment:    _______________________________________________ Lia Hopping, LCSW 12/06/2017, 9:38 AM

## 2017-12-06 NOTE — Progress Notes (Signed)
   Subjective: 2 Days Post-Op Procedure(s) (LRB): RIGHT TOTAL KNEE ARTHROPLASTY (Right) Patient reports pain as moderate.   Patient seen in rounds for Dr. Gladstone Lighter. Patient is well, and has had no acute complaints or problems other than pain in the right knee. She did need IV pain medication this morning because she could no swallow her pain pill due to esophageal issues. Voiding well. Positive flatus. No BM. No SOB or chest pain.  Plan is to go Skilled nursing facility after hospital stay.  Objective: Vital signs in last 24 hours: Temp:  [97.6 F (36.4 C)-98.3 F (36.8 C)] 97.7 F (36.5 C) (12/07 0600) Pulse Rate:  [60-72] 70 (12/07 0600) Resp:  [15-16] 16 (12/07 0600) BP: (106-118)/(47-58) 115/58 (12/07 0600) SpO2:  [96 %-99 %] 96 % (12/07 0600)  Intake/Output from previous day:  Intake/Output Summary (Last 24 hours) at 12/06/2017 0733 Last data filed at 12/06/2017 0600 Gross per 24 hour  Intake 1300 ml  Output 1200 ml  Net 100 ml     Labs: Recent Labs    12/03/17 1016 12/05/17 0508 12/06/17 0448  HGB 11.9* 10.7* 10.6*   Recent Labs    12/05/17 0508 12/06/17 0448  WBC 13.7* 11.0*  RBC 3.73* 3.69*  HCT 33.5* 33.5*  PLT 240 228   Recent Labs    12/05/17 0508 12/06/17 0448  NA 139 142  K 4.0 4.2  CL 104 106  CO2 30 32  BUN 12 15  CREATININE 0.59 0.76  GLUCOSE 121* 92  CALCIUM 8.6* 8.5*   EXAM General - Patient is Alert and Oriented Extremity - Neurologically intact Intact pulses distally Dorsiflexion/Plantar flexion intact No cellulitis present Compartment soft Dressing/Incision - clean, dry, no drainage Motor Function - intact, moving foot and toes well on exam.   Past Medical History:  Diagnosis Date  . Ankle fracture, left 2005  . Cancer ([place])    basal cell ca removed from arm  . Complaints of memory disturbance    evaluated by Dr  Marijean Bravo  . Esophageal stricture   . Esophagitis   . Family history of adverse reaction to anesthesia    MOTHER  ALWAYS HAS N/V WITH SURGERY   . GERD (gastroesophageal reflux disease)   . History of environmental allergies    GETS HIVES OCCASIONALLY WHEN NOT USING JUICE PLUS PILLS   . PAC (premature atrial contraction)   . Palpitations   . Pre-diabetes    DENIES ; SEE HGBA1C 04-25-17 EPIC     Assessment/Plan: 2 Days Post-Op Procedure(s) (LRB): RIGHT TOTAL KNEE ARTHROPLASTY (Right) Active Problems:   Hx of total knee arthroplasty, right  Estimated body mass index is 26.79 kg/m as calculated from the following:   Height as of this encounter: 5\' 6"  (1.676 m).   Weight as of this encounter: 75.3 kg (166 lb). Advance diet Up with therapy Discharge to SNF today vs tomorrow depending on placement and insurance  DVT Prophylaxis - Xarelto Weight-Bearing as tolerated  Will continue therapy. Will add Colace to encourage BM. Plan for DC to SNF today vs tomorrow depending on placement and insurance coverage.   Ardeen Jourdain, PA-C Orthopaedic Surgery 12/06/2017, 7:33 AM

## 2017-12-06 NOTE — Discharge Instructions (Addendum)
Information on my medicine - XARELTO (Rivaroxaban)  This medication education was reviewed with me or my healthcare representative as part of my discharge preparation.   Why was Xarelto prescribed for you? Xarelto was prescribed for you to reduce the risk of blood clots forming after orthopedic surgery. The medical term for these abnormal blood clots is venous thromboembolism (VTE).  What do you need to know about xarelto ? Take your Xarelto ONCE DAILY at the same time every day. You may take it either with or without food.  If you have difficulty swallowing the tablet whole, you may crush it and mix in applesauce just prior to taking your dose.  Take Xarelto exactly as prescribed by your doctor and DO NOT stop taking Xarelto without talking to the doctor who prescribed the medication.  Stopping without other VTE prevention medication to take the place of Xarelto may increase your risk of developing a clot.  After discharge, you should have regular check-up appointments with your healthcare provider that is prescribing your Xarelto.    What do you do if you miss a dose? If you miss a dose, take it as soon as you remember on the same day then continue your regularly scheduled once daily regimen the next day. Do not take two doses of Xarelto on the same day.   Important Safety Information A possible side effect of Xarelto is bleeding. You should call your healthcare provider right away if you experience any of the following: ? Bleeding from an injury or your nose that does not stop. ? Unusual colored urine (red or dark brown) or unusual colored stools (red or black). ? Unusual bruising for unknown reasons. ? A serious fall or if you hit your head (even if there is no bleeding).  Some medicines may interact with Xarelto and might increase your risk of bleeding while on Xarelto. To help avoid this, consult your healthcare provider or pharmacist prior to using any new prescription  or non-prescription medications, including herbals, vitamins, non-steroidal anti-inflammatory drugs (NSAIDs) and supplements.  This website has more information on Xarelto: [email or web address].[email or web address]  INSTRUCTIONS AFTER JOINT REPLACEMENT   o Remove items at home which could result in a fall. This includes throw rugs or furniture in walking pathways o ICE to the affected joint every three hours while awake for 30 minutes at a time, for at least the first 3-5 days, and then as needed for pain and swelling.  Continue to use ice for pain and swelling. You may notice swelling that will progress down to the foot and ankle.  This is normal after surgery.  Elevate your leg when you are not up walking on it.   o Continue to use the breathing machine you got in the hospital (incentive spirometer) which will help keep your temperature down.  It is common for your temperature to cycle up and down following surgery, especially at night when you are not up moving around and exerting yourself.  The breathing machine keeps your lungs expanded and your temperature down.   DIET:  As you were doing prior to hospitalization, we recommend a well-balanced diet.  DRESSING / WOUND CARE / SHOWERING  You may change your dressing every day with sterile gauze.  Please use good hand washing techniques before changing the dressing.  Do not use any lotions or creams on the incision until instructed by your surgeon.  ACTIVITY  o Increase activity slowly as tolerated, but follow the weight bearing instructions below.   o  No driving for 6 weeks or until further direction given by your physician.  You cannot drive while taking narcotics.  o No lifting or carrying greater than 10 lbs. until further directed by your surgeon. o Avoid periods of inactivity such as sitting longer than an hour when not asleep. This helps prevent blood clots.  o You may return to work once you are authorized by your doctor.     WEIGHT BEARING   Weight  bearing as tolerated with assist device (walker, cane, etc) as directed, use it as long as suggested by your surgeon or therapist, typically at least 4-6 weeks.   EXERCISES  Results after joint replacement surgery are often greatly improved when you follow the exercise, range of motion and muscle strengthening exercises prescribed by your doctor. Safety measures are also important to protect the joint from further injury. Any time any of these exercises cause you to have increased pain or swelling, decrease what you are doing until you are comfortable again and then slowly increase them. If you have problems or questions, call your caregiver or physical therapist for advice.   Rehabilitation is important following a joint replacement. After just a few days of immobilization, the muscles of the leg can become weakened and shrink (atrophy).  These exercises are designed to build up the tone and strength of the thigh and leg muscles and to improve motion. Often times heat used for twenty to thirty minutes before working out will loosen up your tissues and help with improving the range of motion but do not use heat for the first two weeks following surgery (sometimes heat can increase post-operative swelling).   These exercises can be done on a training (exercise) mat, on the floor, on a table or on a bed. Use whatever works the best and is most comfortable for you.    Use music or television while you are exercising so that the exercises are a pleasant break in your day. This will make your life better with the exercises acting as a break in your routine that you can look forward to.   Perform all exercises about fifteen times, three times per day or as directed.  You should exercise both the operative leg and the other leg as well.  Exercises include:    Quad Sets - Tighten up the muscle on the front of the thigh (Quad) and hold for 5-10 seconds.    Straight Leg Raises - With your knee straight (if you  were given a brace, keep it on), lift the leg to 60 degrees, hold for 3 seconds, and slowly lower the leg.  Perform this exercise against resistance later as your leg gets stronger.   Leg Slides: Lying on your back, slowly slide your foot toward your buttocks, bending your knee up off the floor (only go as far as is comfortable). Then slowly slide your foot back down until your leg is flat on the floor again.   Angel Wings: Lying on your back spread your legs to the side as far apart as you can without causing discomfort.   Hamstring Strength:  Lying on your back, push your heel against the floor with your leg straight by tightening up the muscles of your buttocks.  Repeat, but this time bend your knee to a comfortable angle, and push your heel against the floor.  You may put a pillow under the heel to make it more comfortable if necessary.   A rehabilitation program following joint replacement  surgery can speed recovery and prevent re-injury in the future due to weakened muscles. Contact your doctor or a physical therapist for more information on knee rehabilitation.    CONSTIPATION  Constipation is defined medically as fewer than three stools per week and severe constipation as less than one stool per week.  Even if you have a regular bowel pattern at home, your normal regimen is likely to be disrupted due to multiple reasons following surgery.  Combination of anesthesia, postoperative narcotics, change in appetite and fluid intake all can affect your bowels.   YOU MUST use at least one of the following options; they are listed in order of increasing strength to get the job done.  They are all available over the counter, and you may need to use some, POSSIBLY even all of these options:    Drink plenty of fluids (prune juice may be helpful) and high fiber foods Colace 100 mg by mouth twice a day  Senokot for constipation as directed and as needed Dulcolax (bisacodyl), take with full glass of  water  Miralax (polyethylene glycol) once or twice a day as needed.  If you have tried all these things and are unable to have a bowel movement in the first 3-4 days after surgery call either your surgeon or your primary doctor.    If you experience loose stools or diarrhea, hold the medications until you stool forms back up.  If your symptoms do not get better within 1 week or if they get worse, check with your doctor.  If you experience "the worst abdominal pain ever" or develop nausea or vomiting, please contact the office immediately for further recommendations for treatment.   ITCHING:  If you experience itching with your medications, try taking only a single pain pill, or even half a pain pill at a time.  You can also use Benadryl over the counter for itching or also to help with sleep.   TED HOSE STOCKINGS:  Use stockings on both legs until for at least 2 weeks or as directed by physician office. They may be removed at night for sleeping.  MEDICATIONS:  See your medication summary on the After Visit Summary that nursing will review with you.  You may have some home medications which will be placed on hold until you complete the course of blood thinner medication.  It is important for you to complete the blood thinner medication as prescribed.  PRECAUTIONS:  If you experience chest pain or shortness of breath - call 911 immediately for transfer to the hospital emergency department.   If you develop a fever greater that 101 F, purulent drainage from wound, increased redness or drainage from wound, foul odor from the wound/dressing, or calf pain - CONTACT YOUR SURGEON.                                                   FOLLOW-UP APPOINTMENTS:  If you do not already have a post-op appointment, please call the office for an appointment to be seen by your surgeon.  Guidelines for how soon to be seen are listed in your After Visit Summary, but are typically between 1-4 weeks after  surgery.   MAKE SURE YOU:   Understand these instructions.   Get help right away if you are not doing well or get worse.  Thank you for letting us be a part of your medical care team.  It is a privilege we respect greatly.  We hope these instructions will help you stay on track for a fast and full recovery!

## 2018-02-07 ENCOUNTER — Other Ambulatory Visit: Payer: Self-pay

## 2018-02-07 ENCOUNTER — Encounter (HOSPITAL_COMMUNITY): Payer: Self-pay | Admitting: *Deleted

## 2018-02-10 ENCOUNTER — Ambulatory Visit (HOSPITAL_COMMUNITY): Payer: Medicare Other | Admitting: Anesthesiology

## 2018-02-10 ENCOUNTER — Encounter (HOSPITAL_COMMUNITY): Payer: Self-pay | Admitting: *Deleted

## 2018-02-10 ENCOUNTER — Encounter (HOSPITAL_COMMUNITY)
Admission: RE | Disposition: A | Discharge status: Home / Self Care | Payer: Self-pay | Source: Ambulatory Visit | Attending: Orthopedic Surgery

## 2018-02-10 ENCOUNTER — Ambulatory Visit (HOSPITAL_COMMUNITY)
Admission: RE | Admit: 2018-02-10 | Discharge: 2018-02-10 | Disposition: A | Discharge status: Home / Self Care | Payer: Medicare Other | Source: Ambulatory Visit | Attending: Orthopedic Surgery | Admitting: Orthopedic Surgery

## 2018-02-10 ENCOUNTER — Other Ambulatory Visit: Payer: Self-pay

## 2018-02-10 DIAGNOSIS — Z96651 Presence of right artificial knee joint: Secondary | ICD-10-CM | POA: Insufficient documentation

## 2018-02-10 DIAGNOSIS — I491 Atrial premature depolarization: Secondary | ICD-10-CM | POA: Diagnosis not present

## 2018-02-10 DIAGNOSIS — Z79899 Other long term (current) drug therapy: Secondary | ICD-10-CM | POA: Insufficient documentation

## 2018-02-10 DIAGNOSIS — Z7901 Long term (current) use of anticoagulants: Secondary | ICD-10-CM | POA: Insufficient documentation

## 2018-02-10 DIAGNOSIS — Z947 Corneal transplant status: Secondary | ICD-10-CM | POA: Diagnosis not present

## 2018-02-10 DIAGNOSIS — K219 Gastro-esophageal reflux disease without esophagitis: Secondary | ICD-10-CM | POA: Insufficient documentation

## 2018-02-10 DIAGNOSIS — Z85828 Personal history of other malignant neoplasm of skin: Secondary | ICD-10-CM | POA: Insufficient documentation

## 2018-02-10 DIAGNOSIS — M1990 Unspecified osteoarthritis, unspecified site: Secondary | ICD-10-CM | POA: Insufficient documentation

## 2018-02-10 DIAGNOSIS — M24561 Contracture, right knee: Secondary | ICD-10-CM | POA: Insufficient documentation

## 2018-02-10 DIAGNOSIS — Z7982 Long term (current) use of aspirin: Secondary | ICD-10-CM | POA: Diagnosis not present

## 2018-02-10 DIAGNOSIS — M24661 Ankylosis, right knee: Secondary | ICD-10-CM | POA: Insufficient documentation

## 2018-02-10 DIAGNOSIS — Z886 Allergy status to analgesic agent status: Secondary | ICD-10-CM | POA: Insufficient documentation

## 2018-02-10 DIAGNOSIS — Z885 Allergy status to narcotic agent status: Secondary | ICD-10-CM | POA: Insufficient documentation

## 2018-02-10 HISTORY — DX: Atrial premature depolarization: I49.1

## 2018-02-10 HISTORY — DX: Pneumonia, unspecified organism: J18.9

## 2018-02-10 HISTORY — DX: Unspecified osteoarthritis, unspecified site: M19.90

## 2018-02-10 HISTORY — PX: KNEE CLOSED REDUCTION: SHX995

## 2018-02-10 SURGERY — MANIPULATION, KNEE, CLOSED
Anesthesia: General | Site: Knee | Laterality: Right

## 2018-02-10 MED ORDER — HYDROMORPHONE HCL 1 MG/ML IJ SOLN
0.2500 mg | INTRAMUSCULAR | Status: DC | PRN
  Administered 2018-02-10 (×4): 0.5 mg via INTRAVENOUS

## 2018-02-10 MED ORDER — PROPOFOL 10 MG/ML IV BOLUS
INTRAVENOUS | Status: AC
  Filled 2018-02-10: qty 20

## 2018-02-10 MED ORDER — PROPOFOL 10 MG/ML IV BOLUS
INTRAVENOUS | Status: DC | PRN
  Administered 2018-02-10: 130 mg via INTRAVENOUS

## 2018-02-10 MED ORDER — MIDAZOLAM HCL 2 MG/2ML IJ SOLN
2.0000 mg | Freq: Once | INTRAMUSCULAR | Status: AC
  Administered 2018-02-10: 1 mg via INTRAVENOUS

## 2018-02-10 MED ORDER — FENTANYL CITRATE (PF) 100 MCG/2ML IJ SOLN
25.0000 ug | INTRAMUSCULAR | Status: DC | PRN
Start: 2018-02-10 — End: 2018-02-10
  Administered 2018-02-10 (×2): 50 ug via INTRAVENOUS

## 2018-02-10 MED ORDER — LIDOCAINE 2% (20 MG/ML) 5 ML SYRINGE
INTRAMUSCULAR | Status: DC | PRN
  Administered 2018-02-10: 100 mg via INTRAVENOUS

## 2018-02-10 MED ORDER — FENTANYL CITRATE (PF) 100 MCG/2ML IJ SOLN
INTRAMUSCULAR | Status: AC
  Filled 2018-02-10: qty 2

## 2018-02-10 MED ORDER — FENTANYL CITRATE (PF) 100 MCG/2ML IJ SOLN
INTRAMUSCULAR | Status: DC | PRN
  Administered 2018-02-10: 50 ug via INTRAVENOUS

## 2018-02-10 MED ORDER — OXYCODONE HCL 5 MG PO TABS: 5.0000 mg | ORAL_TABLET | Freq: Once | ORAL | Status: DC | PRN

## 2018-02-10 MED ORDER — HYDROMORPHONE HCL 1 MG/ML IJ SOLN
INTRAMUSCULAR | Status: AC
  Filled 2018-02-10: qty 1

## 2018-02-10 MED ORDER — FENTANYL CITRATE (PF) 100 MCG/2ML IJ SOLN: 100.0000 ug | Freq: Once | INTRAMUSCULAR | Status: DC

## 2018-02-10 MED ORDER — LACTATED RINGERS IV SOLN
INTRAVENOUS | Status: DC
  Administered 2018-02-10: 12:00:00 via INTRAVENOUS

## 2018-02-10 MED ORDER — LIDOCAINE 2% (20 MG/ML) 5 ML SYRINGE
INTRAMUSCULAR | Status: AC
  Filled 2018-02-10: qty 5

## 2018-02-10 MED ORDER — PROMETHAZINE HCL 25 MG/ML IJ SOLN: 6.2500 mg | INTRAMUSCULAR | Status: DC | PRN

## 2018-02-10 MED ORDER — OXYCODONE HCL 5 MG/5ML PO SOLN
5.0000 mg | Freq: Once | ORAL | Status: DC | PRN
  Filled 2018-02-10: qty 5

## 2018-02-10 MED ORDER — MIDAZOLAM HCL 2 MG/2ML IJ SOLN
INTRAMUSCULAR | Status: AC
  Filled 2018-02-10: qty 2

## 2018-02-10 SURGICAL SUPPLY — 16 items
BANDAGE ADH SHEER 1  50/CT (GAUZE/BANDAGES/DRESSINGS) ×1 IMPLANT
COVER SURGICAL LIGHT HANDLE (MISCELLANEOUS) ×1 IMPLANT
GAUZE SPONGE 4X4 12PLY STRL (GAUZE/BANDAGES/DRESSINGS) ×1 IMPLANT
GLOVE BIOGEL PI IND STRL 8.5 (GLOVE) ×1 IMPLANT
GLOVE BIOGEL PI INDICATOR 8.5 (GLOVE)
GLOVE ECLIPSE 8.0 STRL XLNG CF (GLOVE) ×2 IMPLANT
GOWN STRL REUS W/TWL LRG LVL3 (GOWN DISPOSABLE) ×3 IMPLANT
GOWN STRL REUS W/TWL XL LVL3 (GOWN DISPOSABLE) ×2 IMPLANT
MANIFOLD NEPTUNE II (INSTRUMENTS) ×3 IMPLANT
NDL SAFETY ECLIPSE 18X1.5 (NEEDLE) ×1 IMPLANT
NEEDLE HYPO 18GX1.5 SHARP (NEEDLE)
POSITIONER SURGICAL ARM (MISCELLANEOUS) ×1 IMPLANT
SOL PREP POV-IOD 4OZ 10% (MISCELLANEOUS) ×1 IMPLANT
SYR CONTROL 10ML LL (SYRINGE) ×1 IMPLANT
TOWEL OR 17X26 10 PK STRL BLUE (TOWEL DISPOSABLE) ×2 IMPLANT
WRAP KNEE MAXI GEL POST OP (GAUZE/BANDAGES/DRESSINGS) ×2 IMPLANT

## 2018-02-10 NOTE — Anesthesia Postprocedure Evaluation (Signed)
Anesthesia Post Note  Patient: [name] Stone  Procedure(s) Performed: CLOSED MANIPULATION RIGHT KNEE (Right Knee)     Patient location during evaluation: PACU Anesthesia Type: General Level of consciousness: awake and alert Pain management: pain level controlled Vital Signs Assessment: post-procedure vital signs reviewed and stable Respiratory status: spontaneous breathing, nonlabored ventilation, respiratory function stable and patient connected to nasal cannula oxygen Cardiovascular status: blood pressure returned to baseline and stable Postop Assessment: no apparent nausea or vomiting Anesthetic complications: no    Last Vitals:  Vitals:   02/10/18 1430 02/10/18 1449  BP: 135/81 (!) 144/73  Pulse: 65 72  Resp: 12 12  Temp: 36.9 C 36.7 C  SpO2: 98% 98%    Last Pain:  Vitals:   02/10/18 1449  TempSrc:   PainSc: 6         RLE Motor Response: Purposeful movement (02/10/18 1449) RLE Sensation: Full sensation;Pain;No numbness;No tingling (02/10/18 1449)      Peachie Barkalow S

## 2018-02-10 NOTE — Anesthesia Procedure Notes (Signed)
Procedure Name: LMA Insertion Date/Time: 02/10/2018 1:08 PM Performed by: Lind Covert, CRNA Pre-anesthesia Checklist: Patient identified, Emergency Drugs available, Suction available, Patient being monitored and Timeout performed Patient Re-evaluated:Patient Re-evaluated prior to induction Oxygen Delivery Method: Circle system utilized Preoxygenation: Pre-oxygenation with 100% oxygen Induction Type: IV induction LMA: LMA inserted LMA Size: 4.0 Number of attempts: 1 Placement Confirmation: positive ETCO2 and breath sounds checked- equal and bilateral Tube secured with: Tape Dental Injury: Teeth and Oropharynx as per pre-operative assessment

## 2018-02-10 NOTE — Brief Op Note (Signed)
02/10/2018  1:27 PM  PATIENT:  [name] Stone  67 y.o. female  PRE-OPERATIVE DIAGNOSIS:  Arthrofibrosis Right Total knee with contracture  POST-OPERATIVE DIAGNOSIS:  Arthrofibrosis Right Total knee with Contracture  PROCEDURE:  Procedure(s): CLOSED MANIPULATION RIGHT KNEE (Right)  SURGEON:  Surgeon(s) and Role:    Latanya Maudlin, MD - Primary  PHYSICIAN ASSISTANT:[place] PA   ASSISTANTS: Ardeen Jourdain PA  ANESTHESIA:   general  EBL:  None  BLOOD ADMINISTERED:none  DRAINS: none   LOCAL MEDICATIONS USED:  NONE  SPECIMEN:  No Specimen  DISPOSITION OF SPECIMEN:  N/A  COUNTS:  YES  TOURNIQUET:  * No tourniquets in log *  DICTATION: .Other Dictation: Dictation Number [id number]  PLAN OF CARE: Discharge to home after PACU  PATIENT DISPOSITION:  PACU - hemodynamically stable.   Delay start of Pharmacological VTE agent (>24hrs) due to surgical blood loss or risk of bleeding: yes

## 2018-02-10 NOTE — Transfer of Care (Signed)
Immediate Anesthesia Transfer of Care Note  Patient: [name] Stone  Procedure(s) Performed: CLOSED MANIPULATION RIGHT KNEE (Right Knee)  Patient Location: PACU  Anesthesia Type:General  Level of Consciousness: sedated  Airway & Oxygen Therapy: Patient Spontanous Breathing and Patient connected to face mask oxygen  Post-op Assessment: Report given to RN and Post -op Vital signs reviewed and stable  Post vital signs: Reviewed and stable  Last Vitals:  Vitals:   02/10/18 1120  BP: (!) 139/93  Pulse: 88  Resp: 18  Temp: 37.1 C  SpO2: 100%    Last Pain:  Vitals:   02/10/18 1136  TempSrc:   PainSc: 5       Patients Stated Pain Goal: 4 (82/50/03 7048)  Complications: No apparent anesthesia complications

## 2018-02-10 NOTE — H&P (Signed)
[name] Stone is an 67 y.o. female.   Chief Complaint: Cant flex her right Knee beyond 95 degrees, HPI: She had a Right Total Knee about nine weeks ago and has developed a Contracture. She has undergone intensive PT  Past Medical History:  Diagnosis Date  . Ankle fracture, left 2005  . Arthritis   . Cancer ([place])    basal cell ca removed from arm  . Complaints of memory disturbance    evaluated by Dr  Marijean Bravo  . Esophageal stricture   . Esophagitis   . Family history of adverse reaction to anesthesia    MOTHER ALWAYS HAS N/V WITH SURGERY   . GERD (gastroesophageal reflux disease)   . History of environmental allergies    GETS HIVES OCCASIONALLY WHEN NOT USING JUICE PLUS PILLS   . PAC (premature atrial contraction)   . Palpitations   . Pneumonia    hx of   . Premature atrial contractions    followd by DR Wynonia Lawman     Past Surgical History:  Procedure Laterality Date  . ABDOMINAL HYSTERECTOMY  1998  . ANTERIOR CERVICAL DECOMP/DISCECTOMY FUSION  12/11/2011   Procedure: ANTERIOR CERVICAL DECOMPRESSION/DISCECTOMY FUSION 2 LEVELS;  Surgeon: Earleen Newport;  Location: [facility];  Service: Neurosurgery;  Laterality: N/A;  C5-6 C6-7 Anterior cervical decompression/diskectomy fusion  . ANTERIOR FUSION CERVICAL SPINE  2000  . COLONOSCOPY     every 5 years for FH colon polyps  . CORNEAL TRANSPLANT    . ESOPHAGEAL MANOMETRY N/A 05/25/2016   Procedure: ESOPHAGEAL MANOMETRY (EM);  Surgeon: Manus Gunning, MD;  Location: [facility];  Service: Gastroenterology;  Laterality: N/A;  . EYE SURGERY     x 5 right eye  . INCONTINENCE SURGERY    . KNEE ARTHROSCOPY Right 2015  . KNEE ARTHROSCOPY W/ LATERAL RELEASE Right   . KNEE ARTHROSCOPY W/ MENISCAL REPAIR Left   . NISSEN FUNDOPLICATION  22/29/7989  . TOTAL KNEE ARTHROPLASTY Right 12/04/2017   Procedure: RIGHT TOTAL KNEE ARTHROPLASTY;  Surgeon: Latanya Maudlin, MD;  Location: [facility];  Service: Orthopedics;  Laterality: Right;     Family History  Problem Relation Age of Onset  . Skin cancer Father   . Bladder Cancer Father   . Skin cancer Brother   . Skin cancer Paternal Grandfather   . Colon polyps Brother   . Colitis Mother   . Colon cancer Neg Hx    Social History:  reports that  has never smoked. she has never used smokeless tobacco. She reports that she does not drink alcohol or use drugs.  Allergies:  Allergies  Allergen Reactions  . Aspirin Other (See Comments)    REACTION: retinal hemorhage  . Demerol [Meperidine Hcl] Other (See Comments)    REACTION: hallucinations    Medications Prior to Admission  Medication Sig Dispense Refill  . aspirin 325 MG EC tablet Take 325 mg by mouth daily.    . cetirizine (ZYRTEC) 10 MG tablet Take 10 mg by mouth daily.     Marland Kitchen diltiazem (CARDIZEM) 30 MG tablet Take 30 mg by mouth 3 (three) times daily.     Marland Kitchen esomeprazole (NEXIUM) 40 MG capsule Take 40 mg by mouth daily.     Marland Kitchen estradiol (VIVELLE-DOT) 0.05 MG/24HR patch Place 1 patch onto the skin 2 (two) times a week.     . fluticasone (FLOVENT HFA) 220 MCG/ACT inhaler Use two puffs and swallow twice daily. (Patient taking differently: Inhale 1 puff into the lungs 2 (  two) times daily as needed (ALLERGIES). Use two puffs and swallow twice daily.) 1 Inhaler 5  . LOTEMAX 0.5 % ophthalmic suspension INTSILL 1  DROP IN RIGHT EYE ONCE WEEKLY ON WEDNESDAYS  11  . methocarbamol (ROBAXIN) 500 MG tablet Take 1 tablet (500 mg total) by mouth every 6 (six) hours as needed for muscle spasms. 40 tablet 1  . metoprolol succinate (TOPROL-XL) 25 MG 24 hr tablet Take 25 mg by mouth daily.     Marland Kitchen oxyCODONE-acetaminophen (PERCOCET/ROXICET) 5-325 MG tablet Take 1 tablet by mouth daily as needed for severe pain.    Marland Kitchen UNABLE TO FIND Take 2 tablets by mouth daily. Juice Plus Veggie Blend    . UNABLE TO FIND Take 2 tablets by mouth daily. Juice Plus Fruit Blend    . UNABLE TO FIND Take 2 tablets by mouth daily. Juice Plus [employer]    .  UNABLE TO FIND Take 2 tablets by mouth daily. Juice Plus Omega Blend    . docusate sodium (COLACE) 100 MG capsule Take 1 capsule (100 mg total) by mouth 2 (two) times daily. (Patient not taking: Reported on 02/07/2018) 20 capsule 0  . ferrous sulfate 325 (65 FE) MG tablet Take 1 tablet (325 mg total) by mouth 2 (two) times daily with a meal. (Patient not taking: Reported on 02/07/2018) 20 tablet 0  . HYDROcodone-acetaminophen (NORCO/VICODIN) 5-325 MG tablet Take 1-2 tablets by mouth every 6 (six) hours as needed for moderate pain or severe pain ((score 4 to 6)). (Patient not taking: Reported on 02/07/2018) 60 tablet 0  . ondansetron (ZOFRAN) 4 MG tablet Take 4 mg by mouth every 8 (eight) hours as needed for nausea/vomiting.  0  . polyethylene glycol (MIRALAX / GLYCOLAX) packet Take 17 g by mouth daily as needed for mild constipation. 14 each 0  . rivaroxaban (XARELTO) 10 MG TABS tablet Take 1 tablet (10 mg total) by mouth daily with breakfast. (Patient not taking: Reported on 02/07/2018) 20 tablet 0  . traMADol (ULTRAM) 50 MG tablet Take 1-2 tablets (50-100 mg total) by mouth every 6 (six) hours as needed for moderate pain (not relieved by hydrocodone). (Patient not taking: Reported on 02/07/2018) 60 tablet 0    No results found for this or any previous visit (from the past 48 hour(s)). No results found.  Review of Systems  Constitutional: Negative.   HENT: Negative.   Eyes: Negative.   Respiratory: Negative.   Cardiovascular: Negative.   Gastrointestinal: Negative.   Genitourinary: Negative.   Musculoskeletal: Positive for joint pain.  Skin: Negative.   Neurological: Negative.   Endo/Heme/Allergies: Negative.   Psychiatric/Behavioral: Negative.     Blood pressure (!) 139/93, pulse 88, temperature 98.7 F (37.1 C), temperature source Oral, resp. rate 18, height 5\' 5"  (1.651 m), weight 71.6 kg (157 lb 12.8 oz), SpO2 100 %. Physical Exam  Constitutional: She appears well-developed.  HENT:  Head:  Normocephalic.  Eyes: Pupils are equal, round, and reactive to light.  Neck: Normal range of motion.  Cardiovascular: Normal rate.  Respiratory: Effort normal.  GI: Soft.  Musculoskeletal:  Contracture of right Knee  Neurological: She is alert.  Skin: Skin is warm.  Psychiatric: She has a normal mood and affect.     Assessment/Plan Gentle Closed Manipulation of her Right Knee.  Latanya Maudlin, MD 02/10/2018, 1:04 PM

## 2018-02-10 NOTE — Anesthesia Preprocedure Evaluation (Signed)
Anesthesia Evaluation  Patient identified by MRN, date of birth, ID band Patient awake    Reviewed: Allergy & Precautions, NPO status , Patient's Chart, lab work & pertinent test results  Airway Mallampati: II  TM Distance: >3 FB Neck ROM: Full    Dental no notable dental hx.    Pulmonary neg pulmonary ROS,    Pulmonary exam normal breath sounds clear to auscultation       Cardiovascular negative cardio ROS Normal cardiovascular exam Rhythm:Regular Rate:Normal     Neuro/Psych negative neurological ROS  negative psych ROS   GI/Hepatic Neg liver ROS, GERD  ,  Endo/Other  negative endocrine ROS  Renal/GU negative Renal ROS  negative genitourinary   Musculoskeletal  (+) Arthritis , Osteoarthritis,    Abdominal   Peds negative pediatric ROS (+)  Hematology negative hematology ROS (+)   Anesthesia Other Findings   Reproductive/Obstetrics negative OB ROS                             Anesthesia Physical Anesthesia Plan  ASA: II  Anesthesia Plan: General   Post-op Pain Management:    Induction: Intravenous  PONV Risk Score and Plan: 0 and Treatment may vary due to age or medical condition  Airway Management Planned: Mask  Additional Equipment:   Intra-op Plan:   Post-operative Plan:   Informed Consent: I have reviewed the patients History and Physical, chart, labs and discussed the procedure including the risks, benefits and alternatives for the proposed anesthesia with the patient or authorized representative who has indicated his/her understanding and acceptance.   Dental advisory given  Plan Discussed with: CRNA and Surgeon  Anesthesia Plan Comments:         Anesthesia Quick Evaluation

## 2018-02-10 NOTE — Discharge Instructions (Signed)
Call my office at [phone] and see me Next Monday.. Continue PT as instructed.

## 2018-02-10 NOTE — Interval H&P Note (Signed)
History and Physical Interval Note:  02/10/2018 1:09 PM  [name] Stone  has presented today for surgery, with the diagnosis of Arthrofibrosis Right Total knee  The various methods of treatment have been discussed with the patient and family. After consideration of risks, benefits and other options for treatment, the patient has consented to  Procedure(s): CLOSED MANIPULATION RIGHT KNEE (Right) as a surgical intervention .  [name] history has been reviewed, patient examined, no change in status, stable for surgery.  I have reviewed the patient's chart and labs.  Questions were answered to the patient's satisfaction.     Latanya Maudlin

## 2018-02-11 ENCOUNTER — Encounter (HOSPITAL_COMMUNITY): Payer: Self-pay | Admitting: Orthopedic Surgery

## 2018-02-11 NOTE — Op Note (Signed)
NAME:  [name] Stone, [name] Stone                     ACCOUNT NO.:  MEDICAL RECORD NO.:  [id number]  LOCATION:                                 FACILITY:  PHYSICIAN:  Karolina Zamor A. Aleigh Grunden, M.D.DATE OF BIRTH:  [birth year]  DATE OF PROCEDURE:  02/10/2018 DATE OF DISCHARGE:                              OPERATIVE REPORT   SURGEON:  Kipp Brood. Gladstone Lighter, M.D.  ASSISTANT:  Ardeen Jourdain, [place].  PREOPERATIVE DIAGNOSIS:  Postoperative total knee arthroplasty 9 weeks with severe arthrofibrosis and contracture.  POSTOPERATIVE DIAGNOSIS:  Postoperative total knee arthroplasty 9 weeks with severe arthrofibrosis and contracture.  OPERATION:  Closed manipulation under general anesthesia, right total knee.  DESCRIPTION OF PROCEDURE:  The appropriate time-out was first carried out, I also marked the appropriate right leg in the holding area.  At this time, a gentle closed manipulation was carried out.  I was able to easily flex the knee back to about 120 degrees.  I did the manipulation at about 5-degree increments, then extended the knee, then gently flexed the knee and then extended the knee to make sure that there was no damage done to the quadriceps tendons.  Then, the photographs were taken pre and post manipulation.  She will be released home on crutches.  No block was needed.  No injections were needed.          ______________________________ Kipp Brood Gladstone Lighter, M.D.     RAG/MEDQ  D:  02/10/2018  T:  02/11/2018  Job:  [id number]

## 2018-02-13 ENCOUNTER — Encounter (HOSPITAL_COMMUNITY): Payer: Self-pay | Admitting: Orthopedic Surgery

## 2019-04-06 ENCOUNTER — Telehealth: Payer: Self-pay

## 2019-04-06 NOTE — Telephone Encounter (Signed)
Spoke with pt concerning MyChart video visit on 04/07/19. Pt was advise to check BP and pulse day of appt. Pt stated she is unable to upload EKG, but recently had an EKG done in August 2019 through Dr. Wynonia Lawman. Pt questions and concerns were address.

## 2019-04-07 ENCOUNTER — Encounter: Payer: Self-pay | Admitting: Internal Medicine

## 2019-04-07 ENCOUNTER — Telehealth (INDEPENDENT_AMBULATORY_CARE_PROVIDER_SITE_OTHER): Payer: Medicare Other | Admitting: Internal Medicine

## 2019-04-07 VITALS — BP 127/66 | HR 72

## 2019-04-07 DIAGNOSIS — R002 Palpitations: Secondary | ICD-10-CM | POA: Diagnosis not present

## 2019-04-07 DIAGNOSIS — I491 Atrial premature depolarization: Secondary | ICD-10-CM | POA: Diagnosis not present

## 2019-04-07 NOTE — Progress Notes (Signed)
Electrophysiology TeleHealth Note   Due to national recommendations of social distancing due to [place] 19, Audio/video telehealth visit is felt to be most appropriate for this patient at this time.  See MyChart message from today for patient consent regarding telehealth for [facility].   Date:  04/07/2019   ID:  [name] Stone, DOB 03/07/1951, MRN [id number]  Location: home Provider location:  [facility] Evaluation Performed: New patient consult  PCP:  Lawerance Cruel, MD  Cardiologist:   Dr Wynonia Lawman Electrophysiologist:  None   Chief Complaint:  palpitations  History of Present Illness:    [name] Stone is a 68 y.o. female who presents via audio/video conferencing for a telehealth visit today.   The patient is referred for new consultation regarding palpitations and PACs by Dr Wynonia Lawman. She reports that she has been a patient of Dr Wynonia Lawman "for years".  She last saw him 08/15/2018 (records are currently not available).  She has a h/o PACs for which she takes metoprolol.  She is pleased with her currently health state.  She has been using Juice Plus for the past 2 years and feels very well with this approach.   She has a h/o GERD and is s/p [employer] Fundoplication for which she is on diltiazem. Today, she denies symptoms of palpitations, chest pain, shortness of breath, orthopnea, PND, lower extremity edema, claudication, dizziness, presyncope, syncope, bleeding, or neurologic sequela. The patient is tolerating medications without difficulties and is otherwise without complaint today.   she denies symptoms of cough, fevers, chills, or new SOB worrisome for COVID 19.   Past Medical History:  Diagnosis Date  . Ankle fracture, left 2005  . Arthritis   . Cancer ([place])    basal cell ca removed from arm  . Complaints of memory disturbance    evaluated by Dr  Marijean Bravo  . Esophageal stricture   . Esophagitis   . Family history of adverse reaction to anesthesia    MOTHER ALWAYS HAS N/V  WITH SURGERY   . GERD (gastroesophageal reflux disease)   . History of environmental allergies    GETS HIVES OCCASIONALLY WHEN NOT USING JUICE PLUS PILLS   . Premature atrial contractions    followed by DR Wynonia Lawman     Past Surgical History:  Procedure Laterality Date  . ABDOMINAL HYSTERECTOMY  1998  . ANTERIOR CERVICAL DECOMP/DISCECTOMY FUSION  12/11/2011   Procedure: ANTERIOR CERVICAL DECOMPRESSION/DISCECTOMY FUSION 2 LEVELS;  Surgeon: Earleen Newport;  Location: [facility];  Service: Neurosurgery;  Laterality: N/A;  C5-6 C6-7 Anterior cervical decompression/diskectomy fusion  . ANTERIOR FUSION CERVICAL SPINE  2000  . COLONOSCOPY     every 5 years for FH colon polyps  . CORNEAL TRANSPLANT    . ESOPHAGEAL MANOMETRY N/A 05/25/2016   Procedure: ESOPHAGEAL MANOMETRY (EM);  Surgeon: Manus Gunning, MD;  [facility]: [facility];  Service: Gastroenterology;  Laterality: N/A;  . EYE SURGERY     x 5 right eye  . INCONTINENCE SURGERY    . KNEE ARTHROSCOPY Right 2015  . KNEE ARTHROSCOPY W/ LATERAL RELEASE Right   . KNEE ARTHROSCOPY W/ MENISCAL REPAIR Left   . KNEE CLOSED REDUCTION Right 02/10/2018   Procedure: CLOSED MANIPULATION RIGHT KNEE;  Surgeon: Latanya Maudlin, MD;  Location: [facility];  Service: Orthopedics;  Laterality: Right;  . NISSEN FUNDOPLICATION  92/33/0076  . TOTAL KNEE ARTHROPLASTY Right 12/04/2017   Procedure: RIGHT TOTAL KNEE ARTHROPLASTY;  Surgeon: Latanya Maudlin, MD;  Location: [facility];  Service: Orthopedics;  Laterality: Right;    Current Outpatient Medications  Medication Sig Dispense Refill  . cetirizine (ZYRTEC) 10 MG tablet Take 10 mg by mouth daily.     Marland Kitchen diltiazem (CARDIZEM) 30 MG tablet Take 30 mg by mouth daily.     Marland Kitchen docusate sodium (COLACE) 100 MG capsule Take 1 capsule (100 mg total) by mouth 2 (two) times daily. 20 capsule 0  . esomeprazole (NEXIUM) 40 MG capsule Take 40 mg by mouth daily.     Marland Kitchen estradiol (CLIMARA - DOSED IN MG/24 HR) 0.0375 mg/24hr  patch Place 1 patch onto the skin 2 (two) times a week.     . fluticasone (FLOVENT HFA) 220 MCG/ACT inhaler Use two puffs and swallow twice daily. (Patient taking differently: Inhale 1 puff into the lungs 2 (two) times daily as needed (ALLERGIES). Use two puffs and swallow twice daily.) 1 Inhaler 5  . LOTEMAX 0.5 % ophthalmic suspension INTSILL 1  DROP IN RIGHT EYE ONCE WEEKLY ON WEDNESDAYS  11  . metoprolol succinate (TOPROL-XL) 25 MG 24 hr tablet Take 25 mg by mouth daily.     . polyethylene glycol (MIRALAX / GLYCOLAX) packet Take 17 g by mouth daily as needed for mild constipation. 14 each 0  . aspirin 325 MG EC tablet Take 325 mg by mouth daily.    Marland Kitchen HYDROcodone-acetaminophen (NORCO/VICODIN) 5-325 MG tablet Take 1-2 tablets by mouth every 6 (six) hours as needed for moderate pain or severe pain ((score 4 to 6)). (Patient not taking: Reported on 04/07/2019) 60 tablet 0  . methocarbamol (ROBAXIN) 500 MG tablet Take 1 tablet (500 mg total) by mouth every 6 (six) hours as needed for muscle spasms. (Patient not taking: Reported on 04/07/2019) 40 tablet 1  . ondansetron (ZOFRAN) 4 MG tablet Take 4 mg by mouth every 8 (eight) hours as needed for nausea/vomiting.  0  . rivaroxaban (XARELTO) 10 MG TABS tablet Take 1 tablet (10 mg total) by mouth daily with breakfast. (Patient not taking: Reported on 04/07/2019) 20 tablet 0   No current facility-administered medications for this visit.     Allergies:   Aspirin and Demerol [meperidine hcl]   Social History:  The patient  reports that she has never smoked. She has never used smokeless tobacco. She reports that she does not drink alcohol or use drugs.   Family History:  [name]  family history includes Bladder Cancer in her father; Colitis in her mother; Colon polyps in her brother; Skin cancer in her brother, father, and paternal grandfather.    ROS:  Please see the history of present illness.   All other systems are personally reviewed and negative.     Exam:    Vital Signs:  BP 127/66   Pulse 72   SpO2 97%    Well appearing, alert and conversant, regular work of breathing,  good skin color Eyes- anicteric, neuro- grossly intact, skin- no apparent rash or lesions or cyanosis, mouth- oral mucosa is pink   Labs/Other Tests and Data Reviewed:    Recent Labs: No results found for requested labs within last 8760 hours.   Wt Readings from Last 3 Encounters:  02/10/18 157 lb 12.8 oz (71.6 kg)  12/04/17 166 lb (75.3 kg)  12/03/17 166 lb (75.3 kg)     Other studies personally reviewed: Additional studies/ records that were reviewed today include: epic records  Review of the above records today demonstrates: as above   ASSESSMENT & PLAN:  1.  PACs Currently controlled Lifestyle modification has been very beneficial to her. She is taking Juice Plus and feels that this has been beneficial.  Continue metoprolol  Follow-up with me annually.  2. COVID screen The patient does not have any symptoms that suggest any further testing/ screening at this time.  Social distancing reinforced today.  Follow-up:  Return to see me in a year  Current medicines are reviewed at length with the patient today.   The patient does not have concerns regarding her medicines.  The following changes were made today:  none  Labs/ tests ordered today include:  No orders of the defined types were placed in this encounter.   Patient Risk:  after full review of this patients clinical status, I feel that they are at moderate risk at this time.   Today, I have spent 22 minutes with the patient with telehealth technology discussing PACs .    Signed, Thompson Grayer MD, [place] 04/07/2019 2:46 PM   [facility] [address] [place] [address] [phone] (office) [phone] (fax)

## 2019-04-13 ENCOUNTER — Institutional Professional Consult (permissible substitution): Payer: Medicare Other | Admitting: Internal Medicine

## 2019-05-27 ENCOUNTER — Telehealth: Payer: Self-pay | Admitting: *Deleted

## 2019-05-27 NOTE — Progress Notes (Signed)
Please place orders in Epic as patient is being scheduled for a pre-op appointment!  Patient DOB-1951-03-25, surgery date -06/11/2019. Thank you!

## 2019-05-27 NOTE — Telephone Encounter (Signed)
   Primary Cardiologist: Thompson Grayer, MD  Chart reviewed as part of pre-operative protocol coverage. Patient was contacted 05/27/2019 in reference to pre-operative risk assessment for pending surgery as outlined below.  [name] Stone was last seen on 04/07/19 by Dr. Rayann Heman.  Since that day, [name] Stone has done well. Despite knee pain, she can complete 4.0 METS. She denies any new or worsening cardiac symptoms. She does not currently take xarelto and is unable to take aspirin due to history of retinal hemorrhage.   Therefore, based on ACC/AHA guidelines, the patient would be at acceptable risk for the planned procedure without further cardiovascular testing.   I will route this recommendation to the requesting party via Epic fax function and remove from pre-op pool.  Please call with questions.  Tami Lin Shandora Koogler, PA 05/27/2019, 2:18 PM

## 2019-05-27 NOTE — Telephone Encounter (Signed)
Need to clarify with pt - she has Xarelto 10mg  daily listed on her med list which was likely prescribed back in 2018 for a short duration after her TKA. She had a video visit recently with Dr Rayann Heman and looks as though it was marked that pt is not taking Xarelto any longer, however it is still on her med list. Do not see any other indications that would warrant long term anticoagulation.   APP/MD to provide clearance on full dose aspirin, also do not see an indication for this.

## 2019-05-27 NOTE — Telephone Encounter (Signed)
   [facility] Pre-operative Risk Assessment    Request for surgical clearance:  1. What type of surgery is being performed? I & D EXCISION OF SCAR RTKA WITH POSSIBLE POLY REVISION  2. When is this surgery scheduled? 06/11/19  3. What type of clearance is required (medical clearance vs. Pharmacy clearance to hold med vs. Both)? BOTH  4. Are there any medications that need to be held prior to surgery and how long? XARELTO, ASA  5. Practice name and name of physician performing surgery? [facility]; DR. Alvan Dame  6.   What is your office phone number [phone]   7.   What is your office fax number [phone]  8.   Anesthesia type (None, local, MAC, general) ? SPINAL   Julaine Hua 05/27/2019, 10:23 AM  _________________________________________________________________   (provider comments below)

## 2019-06-02 NOTE — Progress Notes (Signed)
05/27/2019- noted in Epic-Cardiac Clearance from Fabian Sharp, [place] [facility]

## 2019-06-02 NOTE — Patient Instructions (Addendum)
[name] Stone  06/02/2019   Your procedure is scheduled on: Thursday 06/11/2019  Report to [facility] Main  Entrance              Report to admitting at   0900 AM  6/5             YOU NEED TO HAVE A COVID 19 TEST ON__6/8/20_____ @___10 :05____, THIS TEST MUST BE DONE BEFORE SURGERY, COME TO [place].    Call this number if you have problems the morning of surgery [phone]    Remember: Do not eat food :After Midnight.   NO SOLID FOOD AFTER MIDNIGHT THE NIGHT PRIOR TO SURGERY. NOTHING BY MOUTH EXCEPT CLEAR LIQUIDS UNTIL 0430 am.  PLEASE FINISH ENSURE DRINK PER SURGEON ORDER WHICH NEEDS TO BE COMPLETED AT  0430 am.   CLEAR LIQUID DIET   Foods Allowed                                                                     Foods Excluded  Coffee and tea, regular and decaf                             liquids that you cannot  Plain Jell-O in any flavor                                             see through such as: Fruit ices (not with fruit pulp)                                     milk, soups, orange juice  Iced Popsicles                                    All solid food Carbonated beverages, regular and diet                                    Cranberry, grape and apple juices Sports drinks like Gatorade Lightly seasoned clear broth or consume(fat free) Sugar, honey syrup  Sample Menu Breakfast                                Lunch                                     Supper Cranberry juice                    Beef broth  Chicken broth Jell-O                                     Grape juice                           Apple juice Coffee or tea                        Jell-O                                      Popsicle                                                Coffee or tea                        Coffee or tea  _____________________________________________________________________                BRUSH YOUR TEETH MORNING OF SURGERY AND RINSE YOUR MOUTH OUT, NO CHEWING GUM CANDY OR MINTS.     Take these medicines the morning of surgery with A SIP OF WATER: Metoprolol succinate (Toprol-XL), use eye drops,  Cetirzine (Zyrtec), Esomeprzole (Nexium), use Diltiazem (Cardizem) if needed                                  You may not have any metal on your body including hair pins and              piercings  Do not wear jewelry, make-up, lotions, powders or perfumes, deodorant             Do not wear nail polish.  Do not shave  48 hours prior to surgery.              Do not bring valuables to the hospital. [place].  Contacts, dentures or bridgework may not be worn into surgery.  Leave suitcase in the car. After surgery it may be brought to your room.                Please read over the following fact sheets you were given: _____________________________________________________________________             [facility] - Preparing for Surgery Before surgery, you can play an important role.  Because skin is not sterile, your skin needs to be as free of germs as possible.  You can reduce the number of germs on your skin by washing with CHG (chlorahexidine gluconate) soap before surgery.  CHG is an antiseptic cleaner which kills germs and bonds with the skin to continue killing germs even after washing. Please DO NOT use if you have an allergy to CHG or antibacterial soaps.  If your skin becomes reddened/irritated stop using the CHG and inform your nurse when you arrive at Short Stay. Do not shave (including legs and underarms) for at least 48 hours prior to the  first CHG shower.  You may shave your face/neck. Please follow these instructions carefully:  1.  Shower with CHG Soap the night before surgery and the  morning of Surgery.  2.  If you choose to wash your hair, wash your hair first as usual with your  normal  shampoo.  3.  After you  shampoo, rinse your hair and body thoroughly to remove the  shampoo.                           4.  Use CHG as you would any other liquid soap.  You can apply chg directly  to the skin and wash                       Gently with a scrungie or clean washcloth.  5.  Apply the CHG Soap to your body ONLY FROM THE NECK DOWN.   Do not use on face/ open                           Wound or open sores. Avoid contact with eyes, ears mouth and genitals (private parts).                       Wash face,  Genitals (private parts) with your normal soap.             6.  Wash thoroughly, paying special attention to the area where your surgery  will be performed.  7.  Thoroughly rinse your body with warm water from the neck down.  8.  DO NOT shower/wash with your normal soap after using and rinsing off  the CHG Soap.                9.  Pat yourself dry with a clean towel.            10.  Wear clean pajamas.            11.  Place clean sheets on your bed the night of your first shower and do not  sleep with pets. Day of Surgery : Do not apply any lotions/deodorants the morning of surgery.  Please wear clean clothes to the hospital/surgery center.  FAILURE TO FOLLOW THESE INSTRUCTIONS MAY RESULT IN THE CANCELLATION OF YOUR SURGERY PATIENT SIGNATURE_________________________________  NURSE SIGNATURE__________________________________  ________________________________________________________________________   [name] Stone  An incentive spirometer is a tool that can help keep your lungs clear and active. This tool measures how well you are filling your lungs with each breath. Taking long deep breaths may help reverse or decrease the chance of developing breathing (pulmonary) problems (especially infection) following:  A long period of time when you are unable to move or be active. BEFORE THE PROCEDURE   If the spirometer includes an indicator to show your best effort, your nurse or respiratory therapist  will set it to a desired goal.  If possible, sit up straight or lean slightly forward. Try not to slouch.  Hold the incentive spirometer in an upright position. INSTRUCTIONS FOR USE  1. Sit on the edge of your bed if possible, or sit up as far as you can in bed or on a chair. 2. Hold the incentive spirometer in an upright position. 3. Breathe out normally. 4. Place the mouthpiece in your mouth and seal your lips tightly around it. 5. Breathe in slowly and  as deeply as possible, raising the piston or the ball toward the top of the column. 6. Hold your breath for 3-5 seconds or for as long as possible. Allow the piston or ball to fall to the bottom of the column. 7. Remove the mouthpiece from your mouth and breathe out normally. 8. Rest for a few seconds and repeat Steps 1 through 7 at least 10 times every 1-2 hours when you are awake. Take your time and take a few normal breaths between deep breaths. 9. The spirometer may include an indicator to show your best effort. Use the indicator as a goal to work toward during each repetition. 10. After each set of 10 deep breaths, practice coughing to be sure your lungs are clear. If you have an incision (the cut made at the time of surgery), support your incision when coughing by placing a pillow or rolled up towels firmly against it. Once you are able to get out of bed, walk around indoors and cough well. You may stop using the incentive spirometer when instructed by your caregiver.  RISKS AND COMPLICATIONS  Take your time so you do not get dizzy or light-headed.  If you are in pain, you may need to take or ask for pain medication before doing incentive spirometry. It is harder to take a deep breath if you are having pain. AFTER USE  Rest and breathe slowly and easily.  It can be helpful to keep track of a log of your progress. Your caregiver can provide you with a simple table to help with this. If you are using the spirometer at home, follow  these instructions: [place] IF:   You are having difficultly using the spirometer.  You have trouble using the spirometer as often as instructed.  Your pain medication is not giving enough relief while using the spirometer.  You develop fever of 100.5 F (38.1 C) or higher. SEEK IMMEDIATE MEDICAL CARE IF:   You cough up bloody sputum that had not been present before.  You develop fever of 102 F (38.9 C) or greater.  You develop worsening pain at or near the incision site. MAKE SURE YOU:   Understand these instructions.  Will watch your condition.  Will get help right away if you are not doing well or get worse. Document Released: 04/29/2007 Document Revised: 03/10/2012 Document Reviewed: 06/30/2007 Exit[employer] Patient Information 2014 [employer], [place].   ________________________________________________________________________  WHAT IS A BLOOD TRANSFUSION? Blood Transfusion Information  A transfusion is the replacement of blood or some of its parts. Blood is made up of multiple cells which provide different functions.  Red blood cells carry oxygen and are used for blood loss replacement.  White blood cells fight against infection.  Platelets control bleeding.  Plasma helps clot blood.  Other blood products are available for specialized needs, such as hemophilia or other clotting disorders. BEFORE THE TRANSFUSION  Who gives blood for transfusions?   Healthy volunteers who are fully evaluated to make sure their blood is safe. This is blood bank blood. Transfusion therapy is the safest it has ever been in the practice of medicine. Before blood is taken from a donor, a complete history is taken to make sure that person has no history of diseases nor engages in risky social behavior (examples are intravenous drug use or sexual activity with multiple partners). The donor's travel history is screened to minimize risk of transmitting infections, such as malaria. The  donated blood is tested for signs of  infectious diseases, such as HIV and hepatitis. The blood is then tested to be sure it is compatible with you in order to minimize the chance of a transfusion reaction. If you or a relative donates blood, this is often done in anticipation of surgery and is not appropriate for emergency situations. It takes many days to process the donated blood. RISKS AND COMPLICATIONS Although transfusion therapy is very safe and saves many lives, the main dangers of transfusion include:   Getting an infectious disease.  Developing a transfusion reaction. This is an allergic reaction to something in the blood you were given. Every precaution is taken to prevent this. The decision to have a blood transfusion has been considered carefully by your caregiver before blood is given. Blood is not given unless the benefits outweigh the risks. AFTER THE TRANSFUSION  Right after receiving a blood transfusion, you will usually feel much better and more energetic. This is especially true if your red blood cells have gotten low (anemic). The transfusion raises the level of the red blood cells which carry oxygen, and this usually causes an energy increase.  The nurse administering the transfusion will monitor you carefully for complications. HOME CARE INSTRUCTIONS  No special instructions are needed after a transfusion. You may find your energy is better. Speak with your caregiver about any limitations on activity for underlying diseases you may have. SEEK MEDICAL CARE IF:   Your condition is not improving after your transfusion.  You develop redness or irritation at the intravenous (IV) site. SEEK IMMEDIATE MEDICAL CARE IF:  Any of the following symptoms occur over the next 12 hours:  Shaking chills.  You have a temperature by mouth above 102 F (38.9 C), not controlled by medicine.  Chest, back, or muscle pain.  People around you feel you are not acting correctly or are  confused.  Shortness of breath or difficulty breathing.  Dizziness and fainting.  You get a rash or develop hives.  You have a decrease in urine output.  Your urine turns a dark color or changes to pink, red, or brown. Any of the following symptoms occur over the next 10 days:  You have a temperature by mouth above 102 F (38.9 C), not controlled by medicine.  Shortness of breath.  Weakness after normal activity.  The white part of the eye turns yellow (jaundice).  You have a decrease in the amount of urine or are urinating less often.  Your urine turns a dark color or changes to pink, red, or brown. Document Released: 12/14/2000 Document Revised: 03/10/2012 Document Reviewed: 08/02/2008 [facility] Patient Information 2014 [place], [place].  _______________________________________________________________________

## 2019-06-03 ENCOUNTER — Other Ambulatory Visit: Payer: Self-pay

## 2019-06-03 ENCOUNTER — Encounter (HOSPITAL_COMMUNITY)
Admission: RE | Admit: 2019-06-03 | Discharge: 2019-06-03 | Disposition: A | Discharge status: Home / Self Care | Payer: Medicare Other | Source: Ambulatory Visit | Attending: Orthopedic Surgery | Admitting: Orthopedic Surgery

## 2019-06-03 ENCOUNTER — Encounter (HOSPITAL_COMMUNITY): Payer: Self-pay

## 2019-06-03 DIAGNOSIS — Z01818 Encounter for other preprocedural examination: Secondary | ICD-10-CM | POA: Diagnosis present

## 2019-06-03 HISTORY — DX: Cardiac arrhythmia, unspecified: I49.9

## 2019-06-03 LAB — BASIC METABOLIC PANEL
Anion gap: 8 (ref 5–15)
BUN: 16 mg/dL (ref 8–23)
CO2: 27 mmol/L (ref 22–32)
Calcium: 9 mg/dL (ref 8.9–10.3)
Chloride: 105 mmol/L (ref 98–111)
Creatinine, Ser: 0.76 mg/dL (ref 0.44–1.00)
GFR calc Af Amer: >60 mL/min (ref >60)
GFR calc non Af Amer: >60 mL/min (ref >60)
Glucose, Bld: 103 mg/dL — ABNORMAL HIGH (ref 70–99)
Potassium: 3.9 mmol/L (ref 3.5–5.1)
Sodium: 140 mmol/L (ref 135–145)

## 2019-06-03 LAB — CBC
HCT: 39.7 % (ref 36.0–46.0)
Hemoglobin: 12.3 g/dL (ref 12.0–15.0)
MCH: 28.3 pg (ref 26.0–34.0)
MCHC: 31 g/dL (ref 30.0–36.0)
MCV: 91.3 fL (ref 80.0–100.0)
Platelets: 282 10*3/uL (ref 150–400)
RBC: 4.35 MIL/uL (ref 3.87–5.11)
RDW: 14.3 % (ref 11.5–15.5)
WBC: 7.3 10*3/uL (ref 4.0–10.5)
nRBC: 0 % (ref 0.0–0.2)

## 2019-06-03 LAB — SURGICAL PCR SCREEN
MRSA, PCR: NEGATIVE
Staphylococcus aureus: NEGATIVE

## 2019-06-04 NOTE — Progress Notes (Signed)
Anesthesia Chart Review    Case:  [id number] Date/Time:  06/11/19 1049   Procedure:  Irrigation and debridement excision of scar right total knee arthroplasty, possible poly revision (Right ) - 90 mins   Anesthesia type:  Spinal   Pre-op diagnosis:  Status post right total knee arthroplasty with patellar clunk   Location:  [facility] [facility] / [facility]   Surgeon:  Paralee Cancel, MD      DISCUSSION: 68 yo never smoker with h/o GERD, esophageal stricture, PACs s/p right total knee arthroplasty with patellar clunk scheduled for above procedure 06/11/19 with Dr. Paralee Cancel.   Clearance received from cardiology 05/27/2019.  Per Fabian Sharp, PA-C, "[name] Stone was last seen on 04/07/19 by Dr. Rayann Heman.  Since that day, [name] has done well. Despite knee pain, she can complete 4.0 METS. She denies any new or worsening cardiac symptoms. She does not currently take xarelto and is unable to take aspirin due to history of retinal hemorrhage.  Therefore, based on ACC/AHA guidelines, the patient would be at acceptable risk for the planned procedure without further cardiovascular testing."  Pt can proceed with planned procedure barring acute status change.  VS: BP 132/72   Pulse 69   Temp 36.9 C   Resp 18   Ht 5\' 6"  (1.676 m)   Wt 81.2 kg   SpO2 99%   BMI 28.89 kg/m   PROVIDERS: Lawerance Cruel, MD is PCP   Thompson Grayer, MD is Cardiologist  LABS: Labs reviewed: Acceptable for surgery. (all labs ordered are listed, but only abnormal results are displayed)  Labs Reviewed  BASIC METABOLIC PANEL - Abnormal; Notable for the following components:      Result Value   Glucose, Bld 103 (*)    All other components within normal limits  SURGICAL PCR SCREEN  CBC  TYPE AND SCREEN     IMAGES:   EKG: 06/03/2019 Rate 71 bpm Sinus rhythm with Premature supraventricular complexes No significant change since 2018  CV:  Past Medical History:  Diagnosis Date  . Ankle fracture, left 2005  .  Arthritis   . Cancer ([place])    basal cell ca removed from arm  . Complaints of memory disturbance    evaluated by Dr  Marijean Bravo  . Dysrhythmia    Has PACs  . Esophageal stricture   . Esophagitis   . Family history of adverse reaction to anesthesia    MOTHER ALWAYS HAS N/V WITH SURGERY   . GERD (gastroesophageal reflux disease)   . History of environmental allergies    GETS HIVES OCCASIONALLY WHEN NOT USING JUICE PLUS PILLS   . Pneumonia    years ago  . Premature atrial contractions    followed by DR Wynonia Lawman     Past Surgical History:  Procedure Laterality Date  . ABDOMINAL HYSTERECTOMY  1998  . ANTERIOR CERVICAL DECOMP/DISCECTOMY FUSION  12/11/2011   Procedure: ANTERIOR CERVICAL DECOMPRESSION/DISCECTOMY FUSION 2 LEVELS;  Surgeon: Earleen Newport;  Location: [facility];  Service: Neurosurgery;  Laterality: N/A;  C5-6 C6-7 Anterior cervical decompression/diskectomy fusion  . ANTERIOR FUSION CERVICAL SPINE  2000  . COLONOSCOPY     every 5 years for FH colon polyps  . CORNEAL TRANSPLANT    . ESOPHAGEAL MANOMETRY N/A 05/25/2016   Procedure: ESOPHAGEAL MANOMETRY (EM);  Surgeon: Manus Gunning, MD;  Location: [facility];  Service: Gastroenterology;  Laterality: N/A;  . EYE SURGERY     x 5 right eye  .  INCONTINENCE SURGERY    . KNEE ARTHROSCOPY Right 2015  . KNEE ARTHROSCOPY W/ LATERAL RELEASE Right   . KNEE ARTHROSCOPY W/ MENISCAL REPAIR Left   . KNEE CLOSED REDUCTION Right 02/10/2018   Procedure: CLOSED MANIPULATION RIGHT KNEE;  Surgeon: Latanya Maudlin, MD;  Location: [facility];  Service: Orthopedics;  Laterality: Right;  . NISSEN FUNDOPLICATION  [id number]/50/3888  . TOTAL KNEE ARTHROPLASTY Right 12/04/2017   Procedure: RIGHT TOTAL KNEE ARTHROPLASTY;  Surgeon: Latanya Maudlin, MD;  Location: [facility];  Service: Orthopedics;  Laterality: Right;    MEDICATIONS: . cetirizine (ZYRTEC) 10 MG tablet  . diltiazem (CARDIZEM) 30 MG tablet  . docusate sodium (COLACE) 100 MG capsule  .  esomeprazole (NEXIUM) 40 MG capsule  . estradiol (CLIMARA - DOSED IN MG/24 HR) 0.0375 mg/24hr patch  . fluticasone (FLOVENT HFA) 220 MCG/ACT inhaler  . HYDROcodone-acetaminophen (NORCO/VICODIN) 5-325 MG tablet  . LOTEMAX 0.5 % ophthalmic suspension  . methocarbamol (ROBAXIN) 500 MG tablet  . metoprolol succinate (TOPROL-XL) 25 MG 24 hr tablet  . polyethylene glycol (MIRALAX / GLYCOLAX) packet  . prednisoLONE acetate (PRED FORTE) 1 % ophthalmic suspension  . rivaroxaban (XARELTO) 10 MG TABS tablet  . sodium chloride (MURO 128) 5 % ophthalmic solution   No current facility-administered medications for this encounter.     Maia Plan [facility] Pre-Surgical Testing [phone] 06/04/19 3:26 PM

## 2019-06-04 NOTE — Anesthesia Preprocedure Evaluation (Addendum)
Anesthesia Evaluation  Patient identified by MRN, date of birth, ID band Patient awake    Reviewed: Allergy & Precautions, NPO status , Patient's Chart, lab work & pertinent test results, reviewed documented beta blocker date and time   History of Anesthesia Complications (+) Family history of anesthesia reaction and history of anesthetic complications  Airway Mallampati: II  TM Distance: >3 FB Neck ROM: Full    Dental  (+) Teeth Intact, Dental Advisory Given   Pulmonary neg pulmonary ROS,    Pulmonary exam normal breath sounds clear to auscultation       Cardiovascular Normal cardiovascular exam+ dysrhythmias  Rhythm:Regular Rate:Normal     Neuro/Psych negative neurological ROS  negative psych ROS   GI/Hepatic Neg liver ROS, GERD  Medicated,  Endo/Other  negative endocrine ROS  Renal/GU negative Renal ROS     Musculoskeletal  (+) Arthritis ,   Abdominal   Peds  Hematology negative hematology ROS (+) Plt 282k   Anesthesia Other Findings Day of surgery medications reviewed with the patient.  Reproductive/Obstetrics                             Anesthesia Physical Anesthesia Plan  ASA: II  Anesthesia Plan: General   Post-op Pain Management:  Regional for Post-op pain   Induction: Intravenous  PONV Risk Score and Plan: 3 and Midazolam, Dexamethasone and Ondansetron  Airway Management Planned: Oral ETT  Additional Equipment:   Intra-op Plan:   Post-operative Plan: Extubation in OR  Informed Consent: I have reviewed the patients History and Physical, chart, labs and discussed the procedure including the risks, benefits and alternatives for the proposed anesthesia with the patient or authorized representative who has indicated his/her understanding and acceptance.     Dental advisory given  Plan Discussed with: CRNA, Anesthesiologist and Surgeon  Anesthesia Plan Comments:        Anesthesia Quick Evaluation

## 2019-06-07 NOTE — H&P (Signed)
[name] Stone is an 68 y.o. female.    Chief Complaint:   Patella clunk and pain s/p right TKA  Procedure:  I&D and excision of scar right TKA with possible poly exchange  HPI: Pt is a 68 y.o. female complaining of right knee pain since February. Pain had continually increased since the beginning. X-rays in the clinic show right TKA to be in good position and alignmment. Pt has tried various conservative treatments which have failed to alleviate their symptoms, including NSAIDs, PT and activity modifications. Various options are discussed with the patient. Risks, benefits and expectations were discussed with the patient. Patient understand the risks, benefits and expectations and wishes to proceed with surgery.    PCP: Lawerance Cruel, MD  D/C Plans:       Home   Post-op Meds:       No Rx given   Tranexamic Acid:      To be given - IV   Decadron:      Is to be given  FYI:     Xarelto (on pre-op)  Oxy  CPM order for home  DME:   Pt already has equipment   PT:   OPPT   Pharm:  [facility] - [address]   PMH: Past Medical History:  Diagnosis Date  . Ankle fracture, left 2005  . Arthritis   . Cancer ([place])    basal cell ca removed from arm  . Complaints of memory disturbance    evaluated by Dr  Marijean Bravo  . Dysrhythmia    Has PACs  . Esophageal stricture   . Esophagitis   . Family history of adverse reaction to anesthesia    MOTHER ALWAYS HAS N/V WITH SURGERY   . GERD (gastroesophageal reflux disease)   . History of environmental allergies    GETS HIVES OCCASIONALLY WHEN NOT USING JUICE PLUS PILLS   . Pneumonia    years ago  . Premature atrial contractions    followed by DR Wynonia Lawman     PSH: Past Surgical History:  Procedure Laterality Date  . ABDOMINAL HYSTERECTOMY  1998  . ANTERIOR CERVICAL DECOMP/DISCECTOMY FUSION  12/11/2011   Procedure: ANTERIOR CERVICAL DECOMPRESSION/DISCECTOMY FUSION 2 LEVELS;  Surgeon: Earleen Newport;  Location: [facility];  Service:  Neurosurgery;  Laterality: N/A;  C5-6 C6-7 Anterior cervical decompression/diskectomy fusion  . ANTERIOR FUSION CERVICAL SPINE  2000  . COLONOSCOPY     every 5 years for FH colon polyps  . CORNEAL TRANSPLANT    . ESOPHAGEAL MANOMETRY N/A 05/25/2016   Procedure: ESOPHAGEAL MANOMETRY (EM);  Surgeon: Manus Gunning, MD;  Location: [facility];  Service: Gastroenterology;  Laterality: N/A;  . EYE SURGERY     x 5 right eye  . INCONTINENCE SURGERY    . KNEE ARTHROSCOPY Right 2015  . KNEE ARTHROSCOPY W/ LATERAL RELEASE Right   . KNEE ARTHROSCOPY W/ MENISCAL REPAIR Left   . KNEE CLOSED REDUCTION Right 02/10/2018   Procedure: CLOSED MANIPULATION RIGHT KNEE;  Surgeon: Latanya Maudlin, MD;  Location: [facility];  Service: Orthopedics;  Laterality: Right;  . NISSEN FUNDOPLICATION  10/27/2535  . TOTAL KNEE ARTHROPLASTY Right 12/04/2017   Procedure: RIGHT TOTAL KNEE ARTHROPLASTY;  Surgeon: Latanya Maudlin, MD;  Location: [facility];  Service: Orthopedics;  Laterality: Right;    Social History:  reports that she has never smoked. She has never used smokeless tobacco. She reports that she does not drink alcohol or use drugs.  Allergies:  Allergies  Allergen Reactions  . Aspirin Other (See Comments)    REACTION: retinal hemorhage  . Demerol [Meperidine Hcl] Other (See Comments)    REACTION: hallucinations    Medications: No current facility-administered medications for this encounter.    Current Outpatient Medications  Medication Sig Dispense Refill  . cetirizine (ZYRTEC) 10 MG tablet Take 10 mg by mouth daily.     Marland Kitchen diltiazem (CARDIZEM) 30 MG tablet Take 30 mg by mouth daily as needed (esophageal stricture).     Marland Kitchen esomeprazole (NEXIUM) 40 MG capsule Take 40 mg by mouth daily.     Marland Kitchen estradiol (CLIMARA - DOSED IN MG/24 HR) 0.0375 mg/24hr patch Place 1 patch onto the skin 2 (two) times a week.     . LOTEMAX 0.5 % ophthalmic suspension Place 1 drop into the right eye every Wednesday.   11  .  metoprolol succinate (TOPROL-XL) 25 MG 24 hr tablet Take 25 mg by mouth daily.     . prednisoLONE acetate (PRED FORTE) 1 % ophthalmic suspension Place 1 drop into the left eye daily.    . sodium chloride (MURO 128) 5 % ophthalmic solution Place 1 drop into both eyes as needed for eye irritation.    . docusate sodium (COLACE) 100 MG capsule Take 1 capsule (100 mg total) by mouth 2 (two) times daily. (Patient not taking: Reported on 06/01/2019) 20 capsule 0  . fluticasone (FLOVENT HFA) 220 MCG/ACT inhaler Use two puffs and swallow twice daily. (Patient not taking: Reported on 06/01/2019) 1 Inhaler 5  . HYDROcodone-acetaminophen (NORCO/VICODIN) 5-325 MG tablet Take 1-2 tablets by mouth every 6 (six) hours as needed for moderate pain or severe pain ((score 4 to 6)). (Patient not taking: Reported on 04/07/2019) 60 tablet 0  . methocarbamol (ROBAXIN) 500 MG tablet Take 1 tablet (500 mg total) by mouth every 6 (six) hours as needed for muscle spasms. (Patient not taking: Reported on 04/07/2019) 40 tablet 1  . polyethylene glycol (MIRALAX / GLYCOLAX) packet Take 17 g by mouth daily as needed for mild constipation. (Patient not taking: Reported on 06/01/2019) 14 each 0  . rivaroxaban (XARELTO) 10 MG TABS tablet Take 1 tablet (10 mg total) by mouth daily with breakfast. (Patient not taking: Reported on 04/07/2019) 20 tablet 0       Review of Systems  Constitutional: Negative.   HENT: Negative.   Eyes: Negative.   Respiratory: Negative.   Cardiovascular: Negative.   Gastrointestinal: Positive for heartburn.  Genitourinary: Negative.   Musculoskeletal: Positive for joint pain.  Skin: Negative.   Neurological: Negative.   Endo/Heme/Allergies: Positive for environmental allergies.  Psychiatric/Behavioral: Negative.        Physical Exam  Constitutional: She is oriented to person, place, and time. She appears well-developed.  HENT:  Head: Normocephalic.  Eyes: Pupils are equal, round, and reactive to light.   Neck: Neck supple. No JVD present. No tracheal deviation present. No thyromegaly present.  Cardiovascular: Normal rate, regular rhythm and intact distal pulses.  Respiratory: Effort normal and breath sounds normal. No respiratory distress. She has no wheezes.  GI: Soft. There is no abdominal tenderness. There is no guarding.  Musculoskeletal:     Right knee: She exhibits decreased range of motion and swelling. She exhibits no ecchymosis, no deformity, no laceration (healed previous incision) and no erythema. Tenderness found.  Lymphadenopathy:    She has no cervical adenopathy.  Neurological: She is alert and oriented to person, place, and time.  Skin: Skin is warm and dry.  Psychiatric: She has a normal mood and affect.       Assessment/Plan Assessment:   Patella clunk and pain s/p right TKA  Plan: Patient will undergo an I&D and excision of scar right TKA with possible poly exchange on 06/11/2019 per Dr. Alvan Dame at [facility]. Risks benefits and expectations were discussed with the patient. Patient understand risks, benefits and expectations and wishes to proceed.   [place] Jenah Vanasten   PA-C  06/07/2019, 1:33 PM

## 2019-06-08 ENCOUNTER — Other Ambulatory Visit (HOSPITAL_COMMUNITY)
Admission: RE | Admit: 2019-06-08 | Discharge: 2019-06-08 | Disposition: A | Discharge status: Home / Self Care | Payer: Medicare Other | Source: Ambulatory Visit | Attending: Orthopedic Surgery | Admitting: Orthopedic Surgery

## 2019-06-08 DIAGNOSIS — Z1159 Encounter for screening for other viral diseases: Secondary | ICD-10-CM | POA: Diagnosis present

## 2019-06-09 LAB — NOVEL CORONAVIRUS, NAA (HOSP ORDER, SEND-OUT TO REF LAB; TAT 18-24 HRS): SARS-CoV-2, NAA: NOT DETECTED

## 2019-06-10 ENCOUNTER — Other Ambulatory Visit: Payer: Self-pay

## 2019-06-10 NOTE — Progress Notes (Signed)
SPOKE W/ Katharine Look Reffett     SCREENING SYMPTOMS OF COVID 19:   COUGH--no  RUNNY NOSE--- no  SORE THROAT---no  NASAL CONGESTION----no  SNEEZING----no  SHORTNESS OF BREATH---no  DIFFICULTY BREATHING---no  TEMP >100.0 -----no  UNEXPLAINED BODY ACHES------no  CHILLS -------- no  HEADACHES ---------no  LOSS OF SMELL/ TASTE --------no    HAVE YOU OR ANY FAMILY MEMBER TRAVELLED PAST 14 DAYS OUT OF THE   COUNTY---no STATE----no COUNTRY----no  HAVE YOU OR ANY FAMILY MEMBER BEEN EXPOSED TO ANYONE WITH COVID 19?  no

## 2019-06-11 ENCOUNTER — Other Ambulatory Visit: Payer: Self-pay

## 2019-06-11 ENCOUNTER — Encounter (HOSPITAL_COMMUNITY): Payer: Self-pay

## 2019-06-11 ENCOUNTER — Inpatient Hospital Stay (HOSPITAL_COMMUNITY): Payer: Medicare Other | Admitting: Physician Assistant

## 2019-06-11 ENCOUNTER — Encounter (HOSPITAL_COMMUNITY)
Admission: RE | Disposition: A | Discharge status: Home Health | Payer: Self-pay | Source: Home / Self Care | Attending: Orthopedic Surgery

## 2019-06-11 ENCOUNTER — Inpatient Hospital Stay (HOSPITAL_COMMUNITY)
Admission: RE | Admit: 2019-06-11 | Discharge: 2019-06-12 | DRG: 489 | Disposition: A | Discharge status: Home Health | Payer: Medicare Other | Attending: Orthopedic Surgery | Admitting: Orthopedic Surgery

## 2019-06-11 ENCOUNTER — Inpatient Hospital Stay (HOSPITAL_COMMUNITY): Payer: Medicare Other | Admitting: Registered Nurse

## 2019-06-11 DIAGNOSIS — Z7901 Long term (current) use of anticoagulants: Secondary | ICD-10-CM | POA: Diagnosis not present

## 2019-06-11 DIAGNOSIS — K219 Gastro-esophageal reflux disease without esophagitis: Secondary | ICD-10-CM | POA: Diagnosis present

## 2019-06-11 DIAGNOSIS — Z981 Arthrodesis status: Secondary | ICD-10-CM

## 2019-06-11 DIAGNOSIS — M25361 Other instability, right knee: Secondary | ICD-10-CM | POA: Diagnosis present

## 2019-06-11 DIAGNOSIS — M24661 Ankylosis, right knee: Secondary | ICD-10-CM | POA: Diagnosis present

## 2019-06-11 HISTORY — PX: SCAR DEBRIDEMENT OF TOTAL KNEE: SHX6544

## 2019-06-11 LAB — TYPE AND SCREEN
ABO/RH(D): B POS
Antibody Screen: NEGATIVE

## 2019-06-11 SURGERY — REVISION, SCAR, KNEE
Anesthesia: General | Laterality: Right

## 2019-06-11 MED ORDER — CELECOXIB 200 MG PO CAPS
200.0000 mg | ORAL_CAPSULE | Freq: Two times a day (BID) | ORAL | Status: DC
  Administered 2019-06-11: 200 mg via ORAL
  Filled 2019-06-11 (×2): qty 1

## 2019-06-11 MED ORDER — HYDROCODONE-ACETAMINOPHEN 7.5-325 MG PO TABS: 1.0000 | ORAL_TABLET | ORAL | Status: DC | PRN

## 2019-06-11 MED ORDER — LACTATED RINGERS IV SOLN
INTRAVENOUS | Status: DC
  Administered 2019-06-11 (×2): via INTRAVENOUS

## 2019-06-11 MED ORDER — EPHEDRINE 5 MG/ML INJ
INTRAVENOUS | Status: AC
  Filled 2019-06-11: qty 10

## 2019-06-11 MED ORDER — MORPHINE SULFATE (PF) 2 MG/ML IV SOLN: 0.5000 mg | INTRAVENOUS | Status: DC | PRN

## 2019-06-11 MED ORDER — EPHEDRINE SULFATE-NACL 50-0.9 MG/10ML-% IV SOSY
PREFILLED_SYRINGE | INTRAVENOUS | Status: DC | PRN
  Administered 2019-06-11: 10 mg via INTRAVENOUS

## 2019-06-11 MED ORDER — FENTANYL CITRATE (PF) 100 MCG/2ML IJ SOLN
INTRAMUSCULAR | Status: AC
  Filled 2019-06-11: qty 2

## 2019-06-11 MED ORDER — SODIUM CHLORIDE 0.9 % IV SOLN
INTRAVENOUS | Status: DC
  Administered 2019-06-11: 75 mL/h via INTRAVENOUS
  Administered 2019-06-12: 06:00:00 via INTRAVENOUS

## 2019-06-11 MED ORDER — SODIUM CHLORIDE (HYPERTONIC) 5 % OP SOLN
1.0000 [drp] | OPHTHALMIC | Status: DC | PRN
  Administered 2019-06-12: 1 [drp] via OPHTHALMIC
  Filled 2019-06-11: qty 15

## 2019-06-11 MED ORDER — OXYCODONE HCL 5 MG PO TABS
5.0000 mg | ORAL_TABLET | ORAL | Status: DC | PRN
  Administered 2019-06-11 – 2019-06-12 (×2): 5 mg via ORAL
  Filled 2019-06-11 (×2): qty 1

## 2019-06-11 MED ORDER — HYDROCODONE-ACETAMINOPHEN 5-325 MG PO TABS: 1.0000 | ORAL_TABLET | ORAL | Status: DC | PRN

## 2019-06-11 MED ORDER — ROCURONIUM BROMIDE 10 MG/ML (PF) SYRINGE
PREFILLED_SYRINGE | INTRAVENOUS | Status: DC | PRN
  Administered 2019-06-11: 50 mg via INTRAVENOUS

## 2019-06-11 MED ORDER — FENTANYL CITRATE (PF) 100 MCG/2ML IJ SOLN
50.0000 ug | Freq: Once | INTRAMUSCULAR | Status: AC
  Administered 2019-06-11: 50 ug via INTRAVENOUS
  Filled 2019-06-11: qty 2

## 2019-06-11 MED ORDER — MIDAZOLAM HCL 2 MG/2ML IJ SOLN
INTRAMUSCULAR | Status: AC
  Filled 2019-06-11: qty 2

## 2019-06-11 MED ORDER — PROPOFOL 10 MG/ML IV BOLUS
INTRAVENOUS | Status: AC
  Filled 2019-06-11: qty 20

## 2019-06-11 MED ORDER — LORATADINE 10 MG PO TABS
10.0000 mg | ORAL_TABLET | Freq: Every day | ORAL | Status: DC
  Administered 2019-06-12: 10 mg via ORAL
  Filled 2019-06-11: qty 1

## 2019-06-11 MED ORDER — RIVAROXABAN 10 MG PO TABS
10.0000 mg | ORAL_TABLET | ORAL | Status: DC
  Administered 2019-06-12: 10 mg via ORAL
  Filled 2019-06-11: qty 1

## 2019-06-11 MED ORDER — PREDNISOLONE ACETATE 1 % OP SUSP
1.0000 [drp] | Freq: Every day | OPHTHALMIC | Status: DC
  Administered 2019-06-12: 1 [drp] via OPHTHALMIC
  Filled 2019-06-11: qty 5

## 2019-06-11 MED ORDER — PHENOL 1.4 % MT LIQD
1.0000 | OROMUCOSAL | Status: DC | PRN
  Filled 2019-06-11: qty 177

## 2019-06-11 MED ORDER — POVIDONE-IODINE 10 % EX SWAB
2.0000 "application " | Freq: Once | CUTANEOUS | Status: AC
  Administered 2019-06-11: 2 via TOPICAL

## 2019-06-11 MED ORDER — ONDANSETRON HCL 4 MG/2ML IJ SOLN: 4.0000 mg | Freq: Once | INTRAMUSCULAR | Status: DC | PRN

## 2019-06-11 MED ORDER — FERROUS SULFATE 325 (65 FE) MG PO TABS
325.0000 mg | ORAL_TABLET | Freq: Two times a day (BID) | ORAL | Status: DC
  Filled 2019-06-11 (×2): qty 1

## 2019-06-11 MED ORDER — MIDAZOLAM HCL 5 MG/5ML IJ SOLN
INTRAMUSCULAR | Status: DC | PRN
  Administered 2019-06-11: 0.5 mg via INTRAVENOUS

## 2019-06-11 MED ORDER — ROPIVACAINE HCL 7.5 MG/ML IJ SOLN
INTRAMUSCULAR | Status: DC | PRN
  Administered 2019-06-11: 20 mL via PERINEURAL

## 2019-06-11 MED ORDER — MIDAZOLAM HCL 2 MG/2ML IJ SOLN
1.0000 mg | Freq: Once | INTRAMUSCULAR | Status: AC
  Administered 2019-06-11: 2 mg via INTRAVENOUS
  Filled 2019-06-11: qty 2

## 2019-06-11 MED ORDER — ACETAMINOPHEN 500 MG PO TABS
1000.0000 mg | ORAL_TABLET | Freq: Once | ORAL | Status: AC
  Administered 2019-06-11: 1000 mg via ORAL

## 2019-06-11 MED ORDER — TRANEXAMIC ACID-NACL 1000-0.7 MG/100ML-% IV SOLN
1000.0000 mg | Freq: Once | INTRAVENOUS | Status: AC
  Administered 2019-06-11: 1000 mg via INTRAVENOUS
  Filled 2019-06-11: qty 100

## 2019-06-11 MED ORDER — DOCUSATE SODIUM 100 MG PO CAPS
100.0000 mg | ORAL_CAPSULE | Freq: Two times a day (BID) | ORAL | Status: DC
  Administered 2019-06-11: 100 mg via ORAL
  Filled 2019-06-11 (×2): qty 1

## 2019-06-11 MED ORDER — METHOCARBAMOL 1000 MG/10ML IJ SOLN
500.0000 mg | Freq: Four times a day (QID) | INTRAVENOUS | Status: DC | PRN
  Filled 2019-06-11: qty 5

## 2019-06-11 MED ORDER — FENTANYL CITRATE (PF) 100 MCG/2ML IJ SOLN
INTRAMUSCULAR | Status: DC | PRN
  Administered 2019-06-11 (×3): 50 ug via INTRAVENOUS

## 2019-06-11 MED ORDER — ACETAMINOPHEN 325 MG PO TABS
325.0000 mg | ORAL_TABLET | Freq: Four times a day (QID) | ORAL | Status: DC | PRN
  Administered 2019-06-12: 650 mg via ORAL
  Filled 2019-06-11: qty 2

## 2019-06-11 MED ORDER — BUPIVACAINE-EPINEPHRINE (PF) 0.25% -1:200000 IJ SOLN
INTRAMUSCULAR | Status: AC
  Filled 2019-06-11: qty 30

## 2019-06-11 MED ORDER — TRANEXAMIC ACID-NACL 1000-0.7 MG/100ML-% IV SOLN
1000.0000 mg | INTRAVENOUS | Status: AC
  Administered 2019-06-11: 11:00:00 1000 mg via INTRAVENOUS
  Filled 2019-06-11: qty 100

## 2019-06-11 MED ORDER — ONDANSETRON HCL 4 MG/2ML IJ SOLN
INTRAMUSCULAR | Status: DC | PRN
  Administered 2019-06-11: 4 mg via INTRAVENOUS

## 2019-06-11 MED ORDER — DEXAMETHASONE SODIUM PHOSPHATE 10 MG/ML IJ SOLN
INTRAMUSCULAR | Status: AC
  Filled 2019-06-11: qty 1

## 2019-06-11 MED ORDER — FENTANYL CITRATE (PF) 100 MCG/2ML IJ SOLN
INTRAMUSCULAR | Status: AC
  Administered 2019-06-11: 50 ug via INTRAVENOUS
  Filled 2019-06-11: qty 2

## 2019-06-11 MED ORDER — ONDANSETRON HCL 4 MG/2ML IJ SOLN: 4.0000 mg | Freq: Four times a day (QID) | INTRAMUSCULAR | Status: DC | PRN

## 2019-06-11 MED ORDER — NON FORMULARY: 40.0000 mg | Freq: Every day | Status: DC

## 2019-06-11 MED ORDER — METHOCARBAMOL 500 MG PO TABS
500.0000 mg | ORAL_TABLET | Freq: Four times a day (QID) | ORAL | Status: DC | PRN
  Administered 2019-06-11 – 2019-06-12 (×2): 500 mg via ORAL
  Filled 2019-06-11 (×2): qty 1

## 2019-06-11 MED ORDER — PHENYLEPHRINE 40 MCG/ML (10ML) SYRINGE FOR IV PUSH (FOR BLOOD PRESSURE SUPPORT)
PREFILLED_SYRINGE | INTRAVENOUS | Status: AC
  Filled 2019-06-11: qty 10

## 2019-06-11 MED ORDER — FENTANYL CITRATE (PF) 100 MCG/2ML IJ SOLN
25.0000 ug | INTRAMUSCULAR | Status: DC | PRN
  Administered 2019-06-11 (×2): 50 ug via INTRAVENOUS

## 2019-06-11 MED ORDER — BISACODYL 10 MG RE SUPP: 10.0000 mg | Freq: Every day | RECTAL | Status: DC | PRN

## 2019-06-11 MED ORDER — DEXAMETHASONE SODIUM PHOSPHATE 10 MG/ML IJ SOLN
10.0000 mg | Freq: Once | INTRAMUSCULAR | Status: AC
  Administered 2019-06-11: 8 mg via INTRAVENOUS

## 2019-06-11 MED ORDER — DEXAMETHASONE SODIUM PHOSPHATE 10 MG/ML IJ SOLN
10.0000 mg | Freq: Once | INTRAMUSCULAR | Status: AC
  Administered 2019-06-12: 10 mg via INTRAVENOUS
  Filled 2019-06-11: qty 1

## 2019-06-11 MED ORDER — ACETAMINOPHEN 500 MG PO TABS
ORAL_TABLET | ORAL | Status: AC
  Administered 2019-06-11: 10:00:00 1000 mg via ORAL
  Filled 2019-06-11: qty 2

## 2019-06-11 MED ORDER — LIDOCAINE 2% (20 MG/ML) 5 ML SYRINGE
INTRAMUSCULAR | Status: DC | PRN
  Administered 2019-06-11: 80 mg via INTRAVENOUS

## 2019-06-11 MED ORDER — SUGAMMADEX SODIUM 200 MG/2ML IV SOLN
INTRAVENOUS | Status: AC
  Filled 2019-06-11: qty 2

## 2019-06-11 MED ORDER — PHENYLEPHRINE 40 MCG/ML (10ML) SYRINGE FOR IV PUSH (FOR BLOOD PRESSURE SUPPORT)
PREFILLED_SYRINGE | INTRAVENOUS | Status: DC | PRN
  Administered 2019-06-11 (×2): 40 ug via INTRAVENOUS

## 2019-06-11 MED ORDER — ONDANSETRON HCL 4 MG PO TABS: 4.0000 mg | ORAL_TABLET | Freq: Four times a day (QID) | ORAL | Status: DC | PRN

## 2019-06-11 MED ORDER — CEFAZOLIN SODIUM-DEXTROSE 2-4 GM/100ML-% IV SOLN
2.0000 g | INTRAVENOUS | Status: AC
  Administered 2019-06-11: 2 g via INTRAVENOUS
  Filled 2019-06-11: qty 100

## 2019-06-11 MED ORDER — MAGNESIUM CITRATE PO SOLN: 1.0000 | Freq: Once | ORAL | Status: DC | PRN

## 2019-06-11 MED ORDER — ROCURONIUM BROMIDE 10 MG/ML (PF) SYRINGE
PREFILLED_SYRINGE | INTRAVENOUS | Status: AC
  Filled 2019-06-11: qty 10

## 2019-06-11 MED ORDER — KETOROLAC TROMETHAMINE 30 MG/ML IJ SOLN
INTRAMUSCULAR | Status: AC
  Filled 2019-06-11: qty 1

## 2019-06-11 MED ORDER — CLONIDINE HCL (ANALGESIA) 100 MCG/ML EP SOLN
EPIDURAL | Status: DC | PRN
  Administered 2019-06-11: 50 ug

## 2019-06-11 MED ORDER — METOCLOPRAMIDE HCL 5 MG PO TABS
5.0000 mg | ORAL_TABLET | Freq: Three times a day (TID) | ORAL | Status: DC | PRN
  Filled 2019-06-11: qty 2

## 2019-06-11 MED ORDER — METOPROLOL SUCCINATE ER 25 MG PO TB24
25.0000 mg | ORAL_TABLET | Freq: Every day | ORAL | Status: DC
  Administered 2019-06-12: 25 mg via ORAL
  Filled 2019-06-11: qty 1

## 2019-06-11 MED ORDER — ONDANSETRON HCL 4 MG/2ML IJ SOLN
INTRAMUSCULAR | Status: AC
  Filled 2019-06-11: qty 2

## 2019-06-11 MED ORDER — MENTHOL 3 MG MT LOZG: 1.0000 | LOZENGE | OROMUCOSAL | Status: DC | PRN

## 2019-06-11 MED ORDER — PROPOFOL 10 MG/ML IV BOLUS
INTRAVENOUS | Status: DC | PRN
  Administered 2019-06-11: 160 mg via INTRAVENOUS

## 2019-06-11 MED ORDER — CEFAZOLIN SODIUM-DEXTROSE 2-4 GM/100ML-% IV SOLN
2.0000 g | Freq: Four times a day (QID) | INTRAVENOUS | Status: AC
  Administered 2019-06-11 (×2): 2 g via INTRAVENOUS
  Filled 2019-06-11 (×2): qty 100

## 2019-06-11 MED ORDER — CHLORHEXIDINE GLUCONATE 4 % EX LIQD: 60.0000 mL | Freq: Once | CUTANEOUS | Status: DC

## 2019-06-11 MED ORDER — ALUM & MAG HYDROXIDE-SIMETH 200-200-20 MG/5ML PO SUSP: 15.0000 mL | ORAL | Status: DC | PRN

## 2019-06-11 MED ORDER — METOCLOPRAMIDE HCL 5 MG/ML IJ SOLN
5.0000 mg | Freq: Three times a day (TID) | INTRAMUSCULAR | Status: DC | PRN
  Administered 2019-06-11 – 2019-06-12 (×2): 10 mg via INTRAVENOUS
  Filled 2019-06-11 (×2): qty 2

## 2019-06-11 MED ORDER — ESOMEPRAZOLE MAGNESIUM 40 MG PO CPDR
40.0000 mg | DELAYED_RELEASE_CAPSULE | Freq: Every day | ORAL | Status: DC
  Administered 2019-06-12: 40 mg via ORAL
  Filled 2019-06-11: qty 1

## 2019-06-11 MED ORDER — POLYETHYLENE GLYCOL 3350 17 G PO PACK: 17.0000 g | PACK | Freq: Two times a day (BID) | ORAL | Status: DC

## 2019-06-11 MED ORDER — SODIUM CHLORIDE (PF) 0.9 % IJ SOLN
INTRAMUSCULAR | Status: AC
  Filled 2019-06-11: qty 50

## 2019-06-11 MED ORDER — SUGAMMADEX SODIUM 200 MG/2ML IV SOLN
INTRAVENOUS | Status: DC | PRN
  Administered 2019-06-11: 170 mg via INTRAVENOUS

## 2019-06-11 MED ORDER — LIDOCAINE 2% (20 MG/ML) 5 ML SYRINGE
INTRAMUSCULAR | Status: AC
  Filled 2019-06-11: qty 5

## 2019-06-11 MED ORDER — DIPHENHYDRAMINE HCL 12.5 MG/5ML PO ELIX: 12.5000 mg | ORAL_SOLUTION | ORAL | Status: DC | PRN

## 2019-06-11 MED ORDER — DILTIAZEM HCL 30 MG PO TABS
30.0000 mg | ORAL_TABLET | Freq: Every day | ORAL | Status: DC | PRN
  Administered 2019-06-12: 30 mg via ORAL
  Filled 2019-06-11 (×4): qty 1

## 2019-06-11 SURGICAL SUPPLY — 18 items
ADH SKN CLS APL DERMABOND .7 (GAUZE/BANDAGES/DRESSINGS) ×1
BLADE SAW SGTL 11.0X1.19X90.0M (BLADE) IMPLANT
COVER SURGICAL LIGHT HANDLE (MISCELLANEOUS) ×3 IMPLANT
COVER WAND RF STERILE (DRAPES) IMPLANT
DERMABOND ADVANCED (GAUZE/BANDAGES/DRESSINGS) ×2
DERMABOND ADVANCED .7 DNX12 (GAUZE/BANDAGES/DRESSINGS) IMPLANT
DRESSING AQUACEL AG SP 3.5X10 (GAUZE/BANDAGES/DRESSINGS) IMPLANT
DRSG AQUACEL AG SP 3.5X10 (GAUZE/BANDAGES/DRESSINGS)
GLOVE BIOGEL M 7.0 STRL (GLOVE) IMPLANT
GLOVE BIOGEL PI IND STRL 7.5 (GLOVE) IMPLANT
GLOVE BIOGEL PI INDICATOR 7.5 (GLOVE)
GOWN STRL REUS W/TWL XL LVL3 (GOWN DISPOSABLE) IMPLANT
INSERT TIBIAL PFC 3 17.5 (Knees) ×2 IMPLANT
KIT TURNOVER KIT A (KITS) IMPLANT
PACK TOTAL JOINT (CUSTOM PROCEDURE TRAY) ×2 IMPLANT
SUT STRATAFIX PDS+ 0 24IN (SUTURE) ×2 IMPLANT
SYRINGE 60CC LL (MISCELLANEOUS) ×2 IMPLANT
YANKAUER SUCT BULB TIP 10FT TU (MISCELLANEOUS) ×2 IMPLANT

## 2019-06-11 NOTE — Op Note (Signed)
NAME: [name] Stone, [name] Stone MEDICAL RECORD IR:[id number] ACCOUNT [id number] DATE OF BIRTH:December 19, 1951 FACILITY: [facility] LOCATION: [facility] PHYSICIAN:Chavon Lucarelli D. Japneet Staggs, MD  OPERATIVE REPORT  DATE OF PROCEDURE:  06/11/2019  PREOPERATIVE DIAGNOSIS:  Status post right total knee replacement with development of excessive peripatellar scar and patellar clunk associated with subtle knee instability.  POSTOPERATIVE DIAGNOSIS:  Status post right total knee replacement with development of excessive peripatellar scar and patellar clunk associated with subtle knee instability.  PROCEDURE:  Open scar debridement with polyethylene revision of sizing the polyethylene from 12.5-17.5 mm to match her size 3 femur.  SURGEON:  Paralee Cancel, MD  ASSISTANT:  Griffith Citron, PA-C.  Note that [name] Stone was present for the entirety of the case from preoperative positioning, perioperative management of the operative extremity, general facilitation of the case and primary wound closure.  ANESTHESIA:  Regional plus general.  ESTIMATED BLOOD LOSS:  Less than 50 mL.  TOURNIQUET:  Up for 20 minutes at 250 mmHg.  DRAINS:  None.  COMPLICATIONS:  None.  INDICATIONS:  The patient is a pleasant 68 year old female with history of right total knee replacement performed by one of my partners.  She had been seen in the office recently and was noted to have findings of patellar clunk on examination.  She was  seen by me at the time of the evaluation and set up for surgery.  We also discussed her subtle instability noted on exam and the fact that we would evaluate this and potentially upsize her poly to make her knee feel more stable.  The risk of recurrent  scarring was discussed and the potential need for surgery.  Risks of infection and DVT were reviewed as well as discussion of the postoperative course.  Consent was obtained for benefit of pain relief.  PROCEDURE IN DETAIL:  The patient was brought to the operative theater.  Once  adequate anesthesia, preoperative antibiotics, and Ancef were administered as well as tranexamic acid and Decadron, she was positioned supine with a right thigh tourniquet  placed.  The right lower extremity was then prepped and draped in sterile fashion.  A timeout was performed identifying the patient, the planned procedure and extremity.  The leg was exsanguinated, tourniquet elevated to 250 mmHg.  Her previous incision  had been demarcated, and I excised her skin to try to straighten out her incision.  Soft tissue planes were created.  A median arthrotomy was then made, encountering clear yellow synovial fluid.  Once I had made a medial-based exposure and performed  synovectomy and scar debridement medially, attention was directed to the patella.  The patella was everted, and the diagnosis of patellar clunk confirmed.  I subsequently excised the scar circumferentially around the patella down to the quadriceps and  patellar tendon visually.  The lateral gutter was free of any significant scarring.  The knee was then flexed with the patella subluxated laterally.  I then removed the old polyethylene insert.  This was done because on exam under anesthesia she was  noted to have some hyperextension.  There was a little bit of play in the ligaments with anterior, posterior and mediolateral on exam.  Given this, I did use trials and ended up selecting a 17.5 poly which I felt the lateral knee to come to full  extension with a more stable ligament structure.  Given these findings, the final 17.5 posterior stabilized insert to match the size 3 femur was inserted and placed into the tibia.  We irrigated the knee with  1 L of normal saline solution.  The synovial  capsular junction of the knee was injected with 0.25% Marcaine with epinephrine, 1 mL of Toradol, and saline.  The knee was then closed in layers with #1 Vicryl and Stratafix suture on the extensor mechanism.  The remaining wound was closed with 2-0   Vicryl and a running Monocryl stitch.  The knee was then cleaned, dried and dressed sterilely using surgical glue and an Aquacel dressing.  The patient was brought to the recovery room in stable condition, tolerating the procedure well.  Findings were  reviewed with her niece.  I anticipate that she will be in the hospital overnight for observation and be transferred home with therapy set up.  We will see her back in 2 weeks to assess her motion.  LN/NUANCE  D:06/11/2019 T:06/11/2019 JOB:[id number]

## 2019-06-11 NOTE — Transfer of Care (Signed)
Immediate Anesthesia Transfer of Care Note  Patient: [name] Stone  Procedure(s) Performed: Irrigation and debridement excision of scar right total knee arthroplasty, with  poly revision (Right )  Patient Location: PACU  Anesthesia Type:General  Level of Consciousness: awake, alert , oriented and patient cooperative  Airway & Oxygen Therapy: Patient Spontanous Breathing and Patient connected to face mask oxygen  Post-op Assessment: Report given to RN, Post -op Vital signs reviewed and stable and Patient moving all extremities  Post vital signs: Reviewed and stable  Last Vitals:  Vitals Value Taken Time  BP 124/58 06/11/19 1219  Temp    Pulse 86 06/11/19 1221  Resp 18 06/11/19 1221  SpO2 100 % 06/11/19 1221  Vitals shown include unvalidated device data.  Last Pain:  Vitals:   06/11/19 0926  TempSrc:   PainSc: 0-No pain      Patients Stated Pain Goal: 3 (37/10/62 6948)  Complications: No apparent anesthesia complications

## 2019-06-11 NOTE — Anesthesia Postprocedure Evaluation (Signed)
Anesthesia Post Note  Patient: [name] Stone  Procedure(s) Performed: Irrigation and debridement excision of scar right total knee arthroplasty, with  poly revision (Right )     Patient location during evaluation: PACU Anesthesia Type: General Level of consciousness: awake and alert, oriented and awake Pain management: pain level controlled Vital Signs Assessment: post-procedure vital signs reviewed and stable Respiratory status: spontaneous breathing, nonlabored ventilation, respiratory function stable and patient connected to nasal cannula oxygen Cardiovascular status: blood pressure returned to baseline and stable Postop Assessment: no apparent nausea or vomiting Anesthetic complications: no    Last Vitals:  Vitals:   06/11/19 1230 06/11/19 1245  BP: (!) 122/59 120/60  Pulse: 83 82  Resp: 18 16  Temp:    SpO2: 100% 100%    Last Pain:  Vitals:   06/11/19 1245  TempSrc:   PainSc: [place]

## 2019-06-11 NOTE — Evaluation (Signed)
Physical Therapy Evaluation Patient Details Name: [name] Stone MRN: [id number] DOB: 1951-05-14 Today's Date: 06/11/2019   History of Present Illness  68 yo female s/p I&D excision of scar from R TKR with poly revision on 06/11/19. PMH includes OA, L ankle fracture, basal cell carcinoma, memory changes, GERD, PNA, PACs, ACDF 2012, R TKR 2018 with arthroscopy and closed reduction.  Clinical Impression  Pt presents with moderate R knee pain, decreased R knee ROM, post-operative RLE weakness, difficulty performing bed mobility, and dizziness/nausea post-operatively. Pt to benefit from acute PT to address deficits. Pt tolerated sitting EOB, but could not progress further tonight due to nausea and dizziness.RN notified. Pt states she will d/c home with the PRN assist of her mother. PT to progress mobility as tolerated, and will continue to follow acutely.       Follow Up Recommendations Follow surgeon's recommendation for DC plan and follow-up therapies;Supervision for mobility/OOB(OPPT)    Equipment Recommendations  None recommended by PT    Recommendations for Other Services       Precautions / Restrictions Precautions Precautions: Fall Restrictions Weight Bearing Restrictions: No Other Position/Activity Restrictions: WBAT      Mobility  Bed Mobility Overal bed mobility: Needs Assistance Bed Mobility: Supine to Sit;Sit to Supine     Supine to sit: Min assist;HOB elevated Sit to supine: Min assist;HOB elevated   General bed mobility comments: Min assist for supine<>sit for RLE management, trunk management, and scooting to and from EOB. Upon sitting EOB, pt reporting severe nausea and defers further mobility OOB.  Transfers Overall transfer level: (NT- nausea and dizziness limiting pt)                  Ambulation/Gait                Stairs            Wheelchair Mobility    Modified Rankin (Stroke Patients Only)       Balance Overall balance  assessment: Mild deficits observed, not formally tested Sitting-balance support: No upper extremity supported;Feet supported Sitting balance-Leahy Scale: Good                                       Pertinent Vitals/Pain Pain Assessment: 0-10 Pain Score: 6  Pain Location: L knee Pain Descriptors / Indicators: Sharp Pain Intervention(s): Repositioned;Limited activity within patient's tolerance;Monitored during session;Premedicated before session    Home Living Family/patient expects to be discharged to:: Private residence Living Arrangements: Alone Available Help at Discharge: Family;Available PRN/intermittently Type of Home: House Home Access: Stairs to enter Entrance Stairs-Rails: None Entrance Stairs-Number of Steps: 2 Home Layout: Able to live on main level with bedroom/bathroom;Two level Home Equipment: Walker - 2 wheels;Cane - single point      Prior Function Level of Independence: Independent               Hand Dominance   Dominant Hand: Right    Extremity/Trunk Assessment   Upper Extremity Assessment Upper Extremity Assessment: Overall WFL for tasks assessed    Lower Extremity Assessment Lower Extremity Assessment: Overall WFL for tasks assessed;RLE deficits/detail RLE Deficits / Details: post-operative weakness suspected, able to perform quad set, ankle pumps, heel slide to 70*, unable to perform SLR without significant quad lag    Cervical / Trunk Assessment Cervical / Trunk Assessment: Normal  Communication   Communication: No difficulties  Cognition Arousal/Alertness: Awake/alert  Behavior During Therapy: WFL for tasks assessed/performed Overall Cognitive Status: Within Functional Limits for tasks assessed                                        General Comments      Exercises     Assessment/Plan    PT Assessment Patient needs continued PT services  PT Problem List Decreased strength;Decreased  mobility;Decreased range of motion;Decreased activity tolerance;Decreased balance;Decreased knowledge of use of DME;Pain       PT Treatment Interventions Therapeutic activities;DME instruction;Gait training;Therapeutic exercise;Patient/family education;Balance training;Stair training;Functional mobility training    PT Goals (Current goals can be found in the Care Plan section)  Acute Rehab PT Goals Patient Stated Goal: decrease R knee pain and nausea PT Goal Formulation: With patient Time For Goal Achievement: 06/18/19 Potential to Achieve Goals: Good    Frequency 7X/week   Barriers to discharge        Co-evaluation               AM-PAC PT "6 Clicks" Mobility  Outcome Measure Help needed turning from your back to your side while in a flat bed without using bedrails?: A Little Help needed moving from lying on your back to sitting on the side of a flat bed without using bedrails?: A Little Help needed moving to and from a bed to a chair (including a wheelchair)?: A Little Help needed standing up from a chair using your arms (e.g., wheelchair or bedside chair)?: A Little Help needed to walk in hospital room?: A Lot Help needed climbing 3-5 steps with a railing? : A Lot 6 Click Score: 16    End of Session Equipment Utilized During Treatment: Gait belt Activity Tolerance: Other (comment)(pt limited by dizziness and nausea) Patient left: in bed;with bed alarm set;with call bell/phone within reach(on SCD break for skin integrity) Nurse Communication: Mobility status PT Visit Diagnosis: Other abnormalities of gait and mobility (R26.89);Difficulty in walking, not elsewhere classified (R26.2)    Time: 5573-2202 PT Time Calculation (min) (ACUTE ONLY): 13 min   Charges:   PT Evaluation $PT Eval Low Complexity: 1 Low          Julien Girt, PT Acute Rehabilitation Services Pager [phone]  Office [phone]  Buddie Marston D Elonda Husky 06/11/2019, 7:13 PM

## 2019-06-11 NOTE — Brief Op Note (Signed)
06/11/2019  12:05 PM  PATIENT:  [name] Stone  68 y.o. female  PRE-OPERATIVE DIAGNOSIS:  Status post right total knee arthroplasty with patellar clunk and subtle instability  POST-OPERATIVE DIAGNOSIS:  Status post right total knee arthroplasty with patellar clunk and subtle instability  PROCEDURE:  Procedure(s) with comments: Irrigation and debridement excision of scar right total knee arthroplasty, with  poly revision (Right) - 90 mins  SURGEON:  Surgeon(s) and Role:    Paralee Cancel, MD - Primary  PHYSICIAN ASSISTANT: Griffith Citron, PA-C  ANESTHESIA:   regional and general  EBL:  25 mL   BLOOD ADMINISTERED:none  DRAINS: none   LOCAL MEDICATIONS USED:  MARCAINE     SPECIMEN:  No Specimen  DISPOSITION OF SPECIMEN:  N/A  COUNTS:  YES  TOURNIQUET:  20 min at 250 mmHg  DICTATION: .Other Dictation: Dictation Number [id number]  PLAN OF CARE: Admit for overnight observation  PATIENT DISPOSITION:  PACU - hemodynamically stable.   Delay start of Pharmacological VTE agent (>24hrs) due to surgical blood loss or risk of bleeding: not applicable

## 2019-06-11 NOTE — Progress Notes (Signed)
Assisted Dr. Turk with right, ultrasound guided, adductor canal block. Side rails up, monitors on throughout procedure. See vital signs in flow sheet. Tolerated Procedure well.  

## 2019-06-11 NOTE — Anesthesia Procedure Notes (Signed)
Procedure Name: Intubation Date/Time: 06/11/2019 10:48 AM Performed by: Victoriano Lain, CRNA Pre-anesthesia Checklist: Patient identified, Emergency Drugs available, Suction available, Patient being monitored and Timeout performed Patient Re-evaluated:Patient Re-evaluated prior to induction Oxygen Delivery Method: Circle system utilized Preoxygenation: Pre-oxygenation with 100% oxygen Induction Type: IV induction Ventilation: Mask ventilation without difficulty Laryngoscope Size: Mac and 4 Grade View: Grade I Tube type: Oral Tube size: 7.5 mm Number of attempts: 1 Airway Equipment and Method: Stylet Placement Confirmation: ETT inserted through vocal cords under direct vision,  positive ETCO2 and breath sounds checked- equal and bilateral Secured at: 21 cm Tube secured with: Tape Dental Injury: Teeth and Oropharynx as per pre-operative assessment

## 2019-06-11 NOTE — Anesthesia Procedure Notes (Signed)
Anesthesia Regional Block: Adductor canal block   Pre-Anesthetic Checklist: ,, timeout performed, Correct Patient, Correct Site, Correct Laterality, Correct Procedure, Correct Position, site marked, Risks and benefits discussed,  Surgical consent,  Pre-op evaluation,  At surgeon's request and post-op pain management  Laterality: Right  Prep: chloraprep       Needles:  Injection technique: Single-shot  Needle Type: Echogenic Needle     Needle Length: 9cm  Needle Gauge: 21     Additional Needles:   Procedures:,,,, ultrasound used (permanent image in chart),,,,  Narrative:  Start time: 06/11/2019 9:55 AM End time: 06/11/2019 10:00 AM Injection made incrementally with aspirations every 5 mL.  Performed by: Personally  Anesthesiologist: Catalina Gravel, MD  Additional Notes: No pain on injection. No increased resistance to injection. Injection made in 5cc increments.  Good needle visualization.  Patient tolerated procedure well.

## 2019-06-11 NOTE — Discharge Instructions (Addendum)
INSTRUCTIONS AFTER JOINT REPLACEMENT  ° °o Remove items at home which could result in a fall. This includes throw rugs or furniture in walking pathways °o ICE to the affected joint every three hours while awake for 30 minutes at a time, for at least the first 3-5 days, and then as needed for pain and swelling.  Continue to use ice for pain and swelling. You may notice swelling that will progress down to the foot and ankle.  This is normal after surgery.  Elevate your leg when you are not up walking on it.   °o Continue to use the breathing machine you got in the hospital (incentive spirometer) which will help keep your temperature down.  It is common for your temperature to cycle up and down following surgery, especially at night when you are not up moving around and exerting yourself.  The breathing machine keeps your lungs expanded and your temperature down. ° ° °DIET:  As you were doing prior to hospitalization, we recommend a well-balanced diet. ° °DRESSING / WOUND CARE / SHOWERING ° °Keep the surgical dressing until follow up.  The dressing is water proof, so you can shower without any extra covering.  IF THE DRESSING FALLS OFF or the wound gets wet inside, change the dressing with sterile gauze.  Please use good hand washing techniques before changing the dressing.  Do not use any lotions or creams on the incision until instructed by your surgeon.   ° °ACTIVITY ° °o Increase activity slowly as tolerated, but follow the weight bearing instructions below.   °o No driving for 6 weeks or until further direction given by your physician.  You cannot drive while taking narcotics.  °o No lifting or carrying greater than 10 lbs. until further directed by your surgeon. °o Avoid periods of inactivity such as sitting longer than an hour when not asleep. This helps prevent blood clots.  °o You may return to work once you are authorized by your doctor.  ° ° ° °WEIGHT BEARING  ° °Weight bearing as tolerated with assist  device (walker, cane, etc) as directed, use it as long as suggested by your surgeon or therapist, typically at least 4-6 weeks. ° ° °EXERCISES ° °Results after joint replacement surgery are often greatly improved when you follow the exercise, range of motion and muscle strengthening exercises prescribed by your doctor. Safety measures are also important to protect the joint from further injury. Any time any of these exercises cause you to have increased pain or swelling, decrease what you are doing until you are comfortable again and then slowly increase them. If you have problems or questions, call your caregiver or physical therapist for advice.  ° °Rehabilitation is important following a joint replacement. After just a few days of immobilization, the muscles of the leg can become weakened and shrink (atrophy).  These exercises are designed to build up the tone and strength of the thigh and leg muscles and to improve motion. Often times heat used for twenty to thirty minutes before working out will loosen up your tissues and help with improving the range of motion but do not use heat for the first two weeks following surgery (sometimes heat can increase post-operative swelling).  ° °These exercises can be done on a training (exercise) mat, on the floor, on a table or on a bed. Use whatever works the best and is most comfortable for you.    Use music or television while you are exercising so that   the exercises are a pleasant break in your day. This will make your life better with the exercises acting as a break in your routine that you can look forward to.   Perform all exercises about fifteen times, three times per day or as directed.  You should exercise both the operative leg and the other leg as well. ° °Exercises include: °  °• Quad Sets - Tighten up the muscle on the front of the thigh (Quad) and hold for 5-10 seconds.   °• Straight Leg Raises - With your knee straight (if you were given a brace, keep it on),  lift the leg to 60 degrees, hold for 3 seconds, and slowly lower the leg.  Perform this exercise against resistance later as your leg gets stronger.  °• Leg Slides: Lying on your back, slowly slide your foot toward your buttocks, bending your knee up off the floor (only go as far as is comfortable). Then slowly slide your foot back down until your leg is flat on the floor again.  °• Angel Wings: Lying on your back spread your legs to the side as far apart as you can without causing discomfort.  °• Hamstring Strength:  Lying on your back, push your heel against the floor with your leg straight by tightening up the muscles of your buttocks.  Repeat, but this time bend your knee to a comfortable angle, and push your heel against the floor.  You may put a pillow under the heel to make it more comfortable if necessary.  ° °A rehabilitation program following joint replacement surgery can speed recovery and prevent re-injury in the future due to weakened muscles. Contact your doctor or a physical therapist for more information on knee rehabilitation.  ° ° °CONSTIPATION ° °Constipation is defined medically as fewer than three stools per week and severe constipation as less than one stool per week.  Even if you have a regular bowel pattern at home, your normal regimen is likely to be disrupted due to multiple reasons following surgery.  Combination of anesthesia, postoperative narcotics, change in appetite and fluid intake all can affect your bowels.  ° °YOU MUST use at least one of the following options; they are listed in order of increasing strength to get the job done.  They are all available over the counter, and you may need to use some, POSSIBLY even all of these options:   ° °Drink plenty of fluids (prune juice may be helpful) and high fiber foods °Colace 100 mg by mouth twice a day  °Senokot for constipation as directed and as needed Dulcolax (bisacodyl), take with full glass of water  °Miralax (polyethylene glycol)  once or twice a day as needed. ° °If you have tried all these things and are unable to have a bowel movement in the first 3-4 days after surgery call either your surgeon or your primary doctor.   ° °If you experience loose stools or diarrhea, hold the medications until you stool forms back up.  If your symptoms do not get better within 1 week or if they get worse, check with your doctor.  If you experience "the worst abdominal pain ever" or develop nausea or vomiting, please contact the office immediately for further recommendations for treatment. ° ° °ITCHING:  If you experience itching with your medications, try taking only a single pain pill, or even half a pain pill at a time.  You can also use Benadryl over the counter for itching or also to   help with sleep.  ° °TED HOSE STOCKINGS:  Use stockings on both legs until for at least 2 weeks or as directed by physician office. They may be removed at night for sleeping. ° °MEDICATIONS:  See your medication summary on the “After Visit Summary” that nursing will review with you.  You may have some home medications which will be placed on hold until you complete the course of blood thinner medication.  It is important for you to complete the blood thinner medication as prescribed. ° °PRECAUTIONS:  If you experience chest pain or shortness of breath - call 911 immediately for transfer to the hospital emergency department.  ° °If you develop a fever greater that 101 F, purulent drainage from wound, increased redness or drainage from wound, foul odor from the wound/dressing, or calf pain - CONTACT YOUR SURGEON.   °                                                °FOLLOW-UP APPOINTMENTS:  If you do not already have a post-op appointment, please call the office for an appointment to be seen by your surgeon.  Guidelines for how soon to be seen are listed in your “After Visit Summary”, but are typically between 1-4 weeks after surgery. ° °OTHER INSTRUCTIONS:  ° °Knee  Replacement:  Do not place pillow under knee, focus on keeping the knee straight while resting. CPM instructions: 0-90 degrees, 2 hours in the morning, 2 hours in the afternoon, and 2 hours in the evening. Place foam block, curve side up under heel at all times except when in CPM or when walking.  DO NOT modify, tear, cut, or change the foam block in any way. ° °MAKE SURE YOU:  °• Understand these instructions.  °• Get help right away if you are not doing well or get worse.  ° ° °Thank you for letting us be a part of your medical care team.  It is a privilege we respect greatly.  We hope these instructions will help you stay on track for a fast and full recovery!  ° °Information on my medicine - XARELTO® (Rivaroxaban) ° °This medication education was reviewed with me or my healthcare representative as part of my discharge preparation. ° °Why was Xarelto® prescribed for you? °Xarelto® was prescribed for you to reduce the risk of blood clots forming after orthopedic surgery. The medical term for these abnormal blood clots is venous thromboembolism (VTE). ° °What do you need to know about xarelto® ? °Take your Xarelto® ONCE DAILY at the same time every day. °You may take it either with or without food. ° °If you have difficulty swallowing the tablet whole, you may crush it and mix in applesauce just prior to taking your dose. ° °Take Xarelto® exactly as prescribed by your doctor and DO NOT stop taking Xarelto® without talking to the doctor who prescribed the medication.  Stopping without other VTE prevention medication to take the place of Xarelto® may increase your risk of developing a clot. ° °After discharge, you should have regular check-up appointments with your healthcare provider that is prescribing your Xarelto®.   ° °What do you do if you miss a dose? °If you miss a dose, take it as soon as you remember on the same day then continue your regularly scheduled once daily regimen the next day. Do   not take two  doses of Xarelto® on the same day.  ° °Important Safety Information °A possible side effect of Xarelto® is bleeding. You should call your healthcare provider right away if you experience any of the following: °? Bleeding from an injury or your nose that does not stop. °? Unusual colored urine (red or dark brown) or unusual colored stools (red or black). °? Unusual bruising for unknown reasons. °? A serious fall or if you hit your head (even if there is no bleeding). ° °Some medicines may interact with Xarelto® and might increase your risk of bleeding while on Xarelto®. To help avoid this, consult your healthcare provider or pharmacist prior to using any new prescription or non-prescription medications, including herbals, vitamins, non-steroidal anti-inflammatory drugs (NSAIDs) and supplements. ° °This website has more information on Xarelto®: www.xarelto.com. ° ° ° ° °

## 2019-06-11 NOTE — Interval H&P Note (Signed)
History and Physical Interval Note:  06/11/2019 9:03 AM  [name]  has presented today for surgery, with the diagnosis of Status post right total knee arthroplasty with patellar clunk.  The various methods of treatment have been discussed with the patient and family. After consideration of risks, benefits and other options for treatment, the patient has consented to  Procedure(s) with comments: Irrigation and debridement excision of scar right total knee arthroplasty, possible poly revision (Right) - 90 mins as a surgical intervention.  [name] history has been reviewed, patient examined, no change in status, stable for surgery.  I have reviewed the patient's chart and labs.  Questions were answered to the patient's satisfaction.     [name] Stone

## 2019-06-12 ENCOUNTER — Encounter (HOSPITAL_COMMUNITY): Payer: Self-pay | Admitting: Orthopedic Surgery

## 2019-06-12 LAB — CBC
HCT: 37.6 % (ref 36.0–46.0)
Hemoglobin: 11.3 g/dL — ABNORMAL LOW (ref 12.0–15.0)
MCH: 27.8 pg (ref 26.0–34.0)
MCHC: 30.1 g/dL (ref 30.0–36.0)
MCV: 92.4 fL (ref 80.0–100.0)
Platelets: 241 10*3/uL (ref 150–400)
RBC: 4.07 MIL/uL (ref 3.87–5.11)
RDW: 14.5 % (ref 11.5–15.5)
WBC: 11.1 10*3/uL — ABNORMAL HIGH (ref 4.0–10.5)
nRBC: 0 % (ref 0.0–0.2)

## 2019-06-12 LAB — BASIC METABOLIC PANEL
Anion gap: 9 (ref 5–15)
BUN: 16 mg/dL (ref 8–23)
CO2: 23 mmol/L (ref 22–32)
Calcium: 8 mg/dL — ABNORMAL LOW (ref 8.9–10.3)
Chloride: 108 mmol/L (ref 98–111)
Creatinine, Ser: 0.61 mg/dL (ref 0.44–1.00)
GFR calc Af Amer: >60 mL/min (ref >60)
GFR calc non Af Amer: >60 mL/min (ref >60)
Glucose, Bld: 125 mg/dL — ABNORMAL HIGH (ref 70–99)
Potassium: 4.4 mmol/L (ref 3.5–5.1)
Sodium: 140 mmol/L (ref 135–145)

## 2019-06-12 MED ORDER — OXYCODONE HCL 5 MG PO TABS: 5.0000 mg | ORAL_TABLET | ORAL | 0 refills | Status: DC | PRN

## 2019-06-12 MED ORDER — POLYETHYLENE GLYCOL 3350 17 G PO PACK: 17.0000 g | PACK | Freq: Two times a day (BID) | ORAL | 0 refills | Status: DC

## 2019-06-12 MED ORDER — ONDANSETRON HCL 4 MG PO TABS: 4.0000 mg | ORAL_TABLET | Freq: Three times a day (TID) | ORAL | 0 refills | Status: DC | PRN

## 2019-06-12 MED ORDER — FERROUS SULFATE 325 (65 FE) MG PO TABS: 325.0000 mg | ORAL_TABLET | Freq: Three times a day (TID) | ORAL | 3 refills | Status: DC

## 2019-06-12 MED ORDER — ACETAMINOPHEN 500 MG PO TABS: 1000.0000 mg | ORAL_TABLET | Freq: Three times a day (TID) | ORAL | 0 refills | Status: DC

## 2019-06-12 MED ORDER — RIVAROXABAN 10 MG PO TABS: 10.0000 mg | ORAL_TABLET | Freq: Every day | ORAL | 0 refills | Status: DC

## 2019-06-12 MED ORDER — METHOCARBAMOL 500 MG PO TABS: 500.0000 mg | ORAL_TABLET | Freq: Four times a day (QID) | ORAL | 0 refills | Status: DC | PRN

## 2019-06-12 MED ORDER — DOCUSATE SODIUM 100 MG PO CAPS: 100.0000 mg | ORAL_CAPSULE | Freq: Two times a day (BID) | ORAL | 0 refills | Status: DC

## 2019-06-12 NOTE — Progress Notes (Signed)
Physical Therapy Treatment Patient Details Name: [name] Stone MRN: [id number] DOB: [birth year] Today's Date: 06/12/2019    History of Present Illness 68 yo female s/p I&D excision of scar from R TKR with poly revision on 06/11/19. PMH includes OA, L ankle fracture, basal cell carcinoma, memory changes, GERD, PNA, PACs, ACDF 2012, R TKR 2018 with arthroscopy and closed reduction.    PT Comments    Pt ambulated 67' with RW, distance limited by nausea. Initiated R TKA HEP. Good progress expected once nausea resolves.   Follow Up Recommendations  Follow surgeon's recommendation for DC plan and follow-up therapies;Supervision for mobility/OOB(OPPT on 06/15/19)     Equipment Recommendations  None recommended by PT    Recommendations for Other Services       Precautions / Restrictions Precautions Precautions: Knee;Fall Restrictions Weight Bearing Restrictions: No Other Position/Activity Restrictions: WBAT    Mobility  Bed Mobility               General bed mobility comments: sitting at edge of bed at start of PT session  Transfers Overall transfer level: Needs assistance(NT- nausea and dizziness limiting pt) Equipment used: Rolling walker (2 wheeled) Transfers: Sit to/from Stand Sit to Stand: Supervision         General transfer comment: good hand placement  Ambulation/Gait Ambulation/Gait assistance: Min guard Gait Distance (Feet): 38 Feet Assistive device: Rolling walker (2 wheeled) Gait Pattern/deviations: Step-to pattern;Decreased stride length;Decreased weight shift to right Gait velocity: decr   General Gait Details: good sequencing, distance limited by nausea   Stairs             Wheelchair Mobility    Modified Rankin (Stroke Patients Only)       Balance Overall balance assessment: Mild deficits observed, not formally tested Sitting-balance support: No upper extremity supported;Feet supported Sitting balance-Leahy Scale: Good        Standing balance-Leahy Scale: Fair                              Cognition Arousal/Alertness: Awake/alert Behavior During Therapy: WFL for tasks assessed/performed Overall Cognitive Status: Within Functional Limits for tasks assessed                                        Exercises Total Joint Exercises Ankle Circles/Pumps: AROM;Both;10 reps;Supine Quad Sets: AROM;Both;5 reps;Supine Long Arc Quad: AROM;Right;10 reps;Seated Knee Flexion: AAROM;Right;15 reps;Seated Goniometric ROM: 5-100* AAROM R knee    General Comments        Pertinent Vitals/Pain Pain Score: 4  Pain Location: R knee Pain Descriptors / Indicators: Sore Pain Intervention(s): Limited activity within patient's tolerance;Monitored during session;Premedicated before session;Ice applied    Home Living                      Prior Function            PT Goals (current goals can now be found in the care plan section) Acute Rehab PT Goals Patient Stated Goal: decrease R knee pain and nausea; play pickleball PT Goal Formulation: With patient Time For Goal Achievement: 06/18/19 Potential to Achieve Goals: Good Progress towards PT goals: Progressing toward goals    Frequency    7X/week      PT Plan Current plan remains appropriate    Co-evaluation  AM-PAC PT "6 Clicks" Mobility   Outcome Measure  Help needed turning from your back to your side while in a flat bed without using bedrails?: A Little Help needed moving from lying on your back to sitting on the side of a flat bed without using bedrails?: A Little Help needed moving to and from a bed to a chair (including a wheelchair)?: A Little Help needed standing up from a chair using your arms (e.g., wheelchair or bedside chair)?: A Little Help needed to walk in hospital room?: A Little Help needed climbing 3-5 steps with a railing? : A Little 6 Click Score: 18    End of Session Equipment  Utilized During Treatment: Gait belt Activity Tolerance: Treatment limited secondary to medical complications (Comment)(nausea) Patient left: with call bell/phone within reach;in chair;with chair alarm set(on SCD break for skin integrity) Nurse Communication: Mobility status(pt requested medication for nausea) PT Visit Diagnosis: Other abnormalities of gait and mobility (R26.89);Difficulty in walking, not elsewhere classified (R26.2)     Time: 0158-6825 PT Time Calculation (min) (ACUTE ONLY): 34 min  Charges:  $Gait Training: 8-22 mins $Therapeutic Exercise: 8-22 mins                     Blondell Reveal Kistler PT 06/12/2019  Acute Rehabilitation Services Pager [phone] Office [phone]

## 2019-06-12 NOTE — Progress Notes (Signed)
Physical Therapy Treatment Patient Details Name: [name] Stone MRN: [id number] DOB: December 05, 1951 Today's Date: 06/12/2019    History of Present Illness R TKA revision    PT Comments    Pt is progressing well and is ready to DC home from PT standpoint. She ambulated 400' with RW and demonstrates good understanding of HEP.   Follow Up Recommendations  Follow surgeon's recommendation for DC plan and follow-up therapies;Supervision for mobility/OOB(OPPT on 06/15/19)     Equipment Recommendations  None recommended by PT    Recommendations for Other Services       Precautions / Restrictions Precautions Precautions: Knee;Fall Restrictions Weight Bearing Restrictions: No Other Position/Activity Restrictions: WBAT    Mobility  Bed Mobility               General bed mobility comments: up in recliner  Transfers Overall transfer level: Needs assistance(NT- nausea and dizziness limiting pt) Equipment used: Rolling walker (2 wheeled) Transfers: Sit to/from Stand Sit to Stand: Modified independent (Device/Increase time)         General transfer comment: good hand placement  Ambulation/Gait Ambulation/Gait assistance: Modified independent (Device/Increase time) Gait Distance (Feet): 400 Feet Assistive device: Rolling walker (2 wheeled) Gait Pattern/deviations: Step-to pattern;Decreased stride length;Decreased weight shift to right Gait velocity: decr   General Gait Details: good sequencing, no loss of balance   Stairs             Wheelchair Mobility    Modified Rankin (Stroke Patients Only)       Balance Overall balance assessment: Mild deficits observed, not formally tested Sitting-balance support: No upper extremity supported;Feet supported Sitting balance-Leahy Scale: Good       Standing balance-Leahy Scale: Fair                              Cognition Arousal/Alertness: Awake/alert Behavior During Therapy: WFL for tasks  assessed/performed Overall Cognitive Status: Within Functional Limits for tasks assessed                                        Exercises Total Joint Exercises Ankle Circles/Pumps: AROM;Both;10 reps;Supine Quad Sets: AROM;Both;5 reps;Supine Short Arc Quad: AROM;10 reps;Right;Supine Heel Slides: AAROM;Right;10 reps;Supine Straight Leg Raises: AROM;Right;10 reps Long Arc Quad: AROM;Right;10 reps;Seated Knee Flexion: AAROM;Right;15 reps;Seated Goniometric ROM: 5-100* AAROM R knee    General Comments        Pertinent Vitals/Pain Pain Score: 4  Pain Location: R knee Pain Descriptors / Indicators: Sore Pain Intervention(s): Limited activity within patient's tolerance;Monitored during session;Ice applied    Home Living                      Prior Function            PT Goals (current goals can now be found in the care plan section) Acute Rehab PT Goals Patient Stated Goal: decrease R knee pain and nausea; play pickleball PT Goal Formulation: With patient Time For Goal Achievement: 06/18/19 Potential to Achieve Goals: Good Progress towards PT goals: Progressing toward goals    Frequency    7X/week      PT Plan Current plan remains appropriate    Co-evaluation              AM-PAC PT "6 Clicks" Mobility   Outcome Measure  Help needed turning from your back to  your side while in a flat bed without using bedrails?: None Help needed moving from lying on your back to sitting on the side of a flat bed without using bedrails?: None Help needed moving to and from a bed to a chair (including a wheelchair)?: None Help needed standing up from a chair using your arms (e.g., wheelchair or bedside chair)?: None Help needed to walk in hospital room?: None Help needed climbing 3-5 steps with a railing? : A Little 6 Click Score: 23    End of Session Equipment Utilized During Treatment: Gait belt Activity Tolerance: Patient tolerated treatment  well(nausea) Patient left: with call bell/phone within reach;in chair(on SCD break for skin integrity) Nurse Communication: Mobility status(pt requested medication for nausea) PT Visit Diagnosis: Other abnormalities of gait and mobility (R26.89);Difficulty in walking, not elsewhere classified (R26.2)     Time: 2094-7096 PT Time Calculation (min) (ACUTE ONLY): 20 min  Charges:  $Gait Training: 8-22 mins $Therapeutic Exercise: 8-22 mins                     Blondell Reveal Kistler PT 06/12/2019  Acute Rehabilitation Services Pager [phone] Office [phone]

## 2019-06-12 NOTE — Plan of Care (Signed)
  Problem: Education: Goal: Knowledge of General Education information will improve Description: Including pain rating scale, medication(s)/side effects and non-pharmacologic comfort measures Outcome: Adequate for Discharge   Problem: Health Behavior/Discharge Planning: Goal: Ability to manage health-related needs will improve Outcome: Adequate for Discharge   Problem: Clinical Measurements: Goal: Ability to maintain clinical measurements within normal limits will improve Outcome: Adequate for Discharge Goal: Will remain free from infection Outcome: Adequate for Discharge Goal: Diagnostic test results will improve Outcome: Adequate for Discharge Goal: Respiratory complications will improve Outcome: Adequate for Discharge Goal: Cardiovascular complication will be avoided Outcome: Adequate for Discharge   Problem: Activity: Goal: Risk for activity intolerance will decrease Outcome: Adequate for Discharge   Problem: Nutrition: Goal: Adequate nutrition will be maintained Outcome: Adequate for Discharge   Problem: Coping: Goal: Level of anxiety will decrease Outcome: Adequate for Discharge   Problem: Elimination: Goal: Will not experience complications related to bowel motility Outcome: Adequate for Discharge Goal: Will not experience complications related to urinary retention Outcome: Adequate for Discharge   Problem: Pain Managment: Goal: General experience of comfort will improve Outcome: Adequate for Discharge   Problem: Safety: Goal: Ability to remain free from injury will improve Outcome: Adequate for Discharge   Problem: Skin Integrity: Goal: Risk for impaired skin integrity will decrease Outcome: Adequate for Discharge   Problem: Education: Goal: Required Educational Video(s) Outcome: Adequate for Discharge   Problem: Clinical Measurements: Goal: Postoperative complications will be avoided or minimized Outcome: Adequate for Discharge   Problem: Skin  Integrity: Goal: Demonstration of wound healing without infection will improve Outcome: Adequate for Discharge  Home with friend. Discharge teaching done. All questions answered.

## 2019-06-12 NOTE — Progress Notes (Signed)
     Subjective: 1 Day Post-Op Procedure(s) (LRB): Irrigation and debridement excision of scar right total knee arthroplasty, with  poly revision (Right)   Patient reports pain as mild, pain controlled.  No events throughout the night. We discussed to procedure and expectations.  Reviewed her medications.  Feels that she is ready to be discharged home.   Objective:   VITALS:   Vitals:   06/12/19 0056 06/12/19 0453  BP: (!) 110/56 (!) 107/54  Pulse: (!) 51 61  Resp: 14 18  Temp: 97.6 F (36.4 C) 97.6 F (36.4 C)  SpO2: 100% 100%    Dorsiflexion/Plantar flexion intact Incision: dressing C/D/I No cellulitis present Compartment soft  LABS Recent Labs    06/12/19 0502  HGB 11.3*  HCT 37.6  WBC 11.1*  PLT 241    Recent Labs    06/12/19 0502  NA 140  K 4.4  BUN 16  CREATININE 0.61  GLUCOSE 125*     Assessment/Plan: 1 Day Post-Op Procedure(s) (LRB): Irrigation and debridement excision of scar right total knee arthroplasty, with  poly revision (Right) Foley cath d/c'ed Advance diet Up with therapy D/C IV fluids Discharge home Follow up in 2 weeks at [facility] Follow up with OLIN,Emory Gallentine D in 2 weeks.  Contact information:  [facility]) [address]. Jet Traynham   PAC  06/12/2019, 8:44 AM

## 2019-06-12 NOTE — TOC Transition Note (Signed)
Transition of Care [facility]) - CM/SW Discharge Note   Patient Details  Name: [name] Stone MRN: [id number] Date of Birth: 08-24-1951  Transition of Care [facility]) CM/SW Contact:  Lia Hopping, [place] Phone Number: 06/12/2019, 10:08 AM   Clinical Narrative:    CPM orders acknowledged. CSW inform Rep. Ovid Curd w/ [employer]. CPM to be delivered to patient home.    Final next level of care: [place] Barriers to Discharge: No Barriers Identified   Patient Goals and CMS Choice Patient states their goals for this hospitalization and ongoing recovery are:: Transition Home w/ Therapy CMS Medicare.gov Compare Post Acute Care list provided to:: (prearranged with physician)    Discharge Placement  Home                     Discharge Plan and Services                DME Arranged: CPM DME Agency: [employer] Date DME Agency Contacted: 06/12/19 Time DME Agency Contacted: [phone] Representative spoke with at DME Agency: [place]: (Prearranged by Mindi Junker)        Social Determinants of Health ([place]) Interventions     Readmission Risk Interventions No flowsheet data found.

## 2019-06-16 NOTE — Discharge Summary (Signed)
Physician Discharge Summary  Patient ID: [name] Stone MRN: [id number] DOB/AGE: Aug 13, 1951 68 y.o.  Admit date: 06/11/2019 Discharge date: 06/12/2019   Procedures:  Procedure(s) (LRB): Irrigation and debridement excision of scar right total knee arthroplasty, with  poly revision (Right)  Attending Physician:  Dr. Paralee Cancel   Admission Diagnoses:   I&D and excision of scar right TKA with poly exchange  Discharge Diagnoses:  Active Problems:   Arthrofibrosis of knee joint, right  Past Medical History:  Diagnosis Date  . Ankle fracture, left 2005  . Arthritis   . Cancer ([place])    basal cell ca removed from arm  . Complaints of memory disturbance    evaluated by Dr  Marijean Bravo  . Dysrhythmia    Has PACs  . Esophageal stricture   . Esophagitis   . Family history of adverse reaction to anesthesia    MOTHER ALWAYS HAS N/V WITH SURGERY   . GERD (gastroesophageal reflux disease)   . History of environmental allergies    GETS HIVES OCCASIONALLY WHEN NOT USING JUICE PLUS PILLS   . Pneumonia    years ago  . Premature atrial contractions    followed by DR Wynonia Lawman     HPI:    Pt is a 68 y.o. female complaining of right knee pain since February. Pain had continually increased since the beginning. X-rays in the clinic show right TKA to be in good position and alignmment. Pt has tried various conservative treatments which have failed to alleviate their symptoms, including NSAIDs, PT and activity modifications. Various options are discussed with the patient. Risks, benefits and expectations were discussed with the patient. Patient understand the risks, benefits and expectations and wishes to proceed with surgery.  PCP: Lawerance Cruel, MD   Discharged Condition: good  Hospital Course:  Patient underwent the above stated procedure on 06/11/2019. Patient tolerated the procedure well and brought to the recovery room in good condition and subsequently to the floor.  POD #1 BP: 107/54 ;  Pulse: 61 ; Temp: 97.6 F (36.4 C) ; Resp: 18 Patient reports pain as mild, pain controlled.  No events throughout the night. We discussed to procedure and expectations.  Reviewed her medications.  Feels that she is ready to be discharged home. Dorsiflexion/plantar flexion intact, incision: dressing C/D/I, no cellulitis present and compartment soft.   LABS  Basename    HGB     11.3  HCT     37.6    Discharge Exam: General appearance: alert, cooperative and no distress Extremities: Homans sign is negative, no sign of DVT, no edema, redness or tenderness in the calves or thighs and no ulcers, gangrene or trophic changes  Disposition:  Home with follow up in 2 weeks   Follow-up Information    Paralee Cancel, MD. Schedule an appointment as soon as possible for a visit in 2 weeks.   Specialty: Orthopedic Surgery Contact information: [address] [place] [address] [phone]           Discharge Instructions    Call MD / Call 911   Complete by: As directed    If you experience chest pain or shortness of breath, CALL 911 and be transported to the hospital emergency room.  If you develope a fever above 101 F, pus (white drainage) or increased drainage or redness at the wound, or calf pain, call your surgeon's office.   Change dressing   Complete by: As directed    Maintain surgical dressing  until follow up in the clinic. If the edges start to pull up, may reinforce with tape. If the dressing is no longer working, may remove and cover with gauze and tape, but must keep the area dry and clean.  Call with any questions or concerns.   Constipation Prevention   Complete by: As directed    Drink plenty of fluids.  Prune juice may be helpful.  You may use a stool softener, such as Colace (over the counter) 100 mg twice a day.  Use MiraLax (over the counter) for constipation as needed.   Diet - low sodium heart healthy   Complete by: As directed    Discharge  instructions   Complete by: As directed    Maintain surgical dressing until follow up in the clinic. If the edges start to pull up, may reinforce with tape. If the dressing is no longer working, may remove and cover with gauze and tape, but must keep the area dry and clean.  Follow up in 2 weeks at [facility]. Call with any questions or concerns.   Increase activity slowly as tolerated   Complete by: As directed    Weight bearing as tolerated with assist device (walker, cane, etc) as directed, use it as long as suggested by your surgeon or therapist, typically at least 4-6 weeks.   TED hose   Complete by: As directed    Use stockings (TED hose) for 2 weeks on both leg(s).  You may remove them at night for sleeping.      Allergies as of 06/12/2019      Reactions   Aspirin Other (See Comments)   REACTION: retinal hemorhage   Demerol [meperidine Hcl] Other (See Comments)   REACTION: hallucinations      Medication List    STOP taking these medications   fluticasone 220 MCG/ACT inhaler Commonly known as: Flovent HFA   HYDROcodone-acetaminophen 5-325 MG tablet Commonly known as: NORCO/VICODIN     TAKE these medications   acetaminophen 500 MG tablet Commonly known as: TYLENOL Take 2 tablets (1,000 mg total) by mouth every 8 (eight) hours.   cetirizine 10 MG tablet Commonly known as: ZYRTEC Take 10 mg by mouth daily.   diltiazem 30 MG tablet Commonly known as: CARDIZEM Take 30 mg by mouth daily as needed (esophageal stricture).   docusate sodium 100 MG capsule Commonly known as: Colace Take 1 capsule (100 mg total) by mouth 2 (two) times daily.   esomeprazole 40 MG capsule Commonly known as: NEXIUM Take 40 mg by mouth daily.   estradiol 0.0375 mg/24hr patch Commonly known as: CLIMARA - Dosed in mg/24 hr Place 1 patch onto the skin 2 (two) times a week.   ferrous sulfate 325 (65 FE) MG tablet Commonly known as: FerrouSul Take 1 tablet (325 mg total) by  mouth 3 (three) times daily with meals.   Lotemax 0.5 % ophthalmic suspension Generic drug: loteprednol Place 1 drop into the right eye every Wednesday.   methocarbamol 500 MG tablet Commonly known as: Robaxin Take 1 tablet (500 mg total) by mouth every 6 (six) hours as needed for muscle spasms.   metoprolol succinate 25 MG 24 hr tablet Commonly known as: TOPROL-XL Take 25 mg by mouth daily.   ondansetron 4 MG tablet Commonly known as: Zofran Take 1 tablet (4 mg total) by mouth every 8 (eight) hours as needed for nausea or vomiting.   oxyCODONE 5 MG immediate release tablet Commonly known as: Oxy IR/ROXICODONE Take 1-2  tablets (5-10 mg total) by mouth every 4 (four) hours as needed for moderate pain or severe pain.   polyethylene glycol 17 g packet Commonly known as: MIRALAX / GLYCOLAX Take 17 g by mouth 2 (two) times daily. What changed:   when to take this  reasons to take this   prednisoLONE acetate 1 % ophthalmic suspension Commonly known as: PRED FORTE Place 1 drop into the left eye daily.   rivaroxaban 10 MG Tabs tablet Commonly known as: XARELTO Take 1 tablet (10 mg total) by mouth daily with breakfast.   sodium chloride 5 % ophthalmic solution Commonly known as: MURO 128 Place 1 drop into both eyes as needed for eye irritation.            Discharge Care Instructions  (From admission, onward)         Start     Ordered   06/12/19 0000  Change dressing    Comments: Maintain surgical dressing until follow up in the clinic. If the edges start to pull up, may reinforce with tape. If the dressing is no longer working, may remove and cover with gauze and tape, but must keep the area dry and clean.  Call with any questions or concerns.   06/12/19 0755           Signed: [place]. Pema Thomure   PA-C  06/16/2019, 8:16 AM

## 2019-08-10 ENCOUNTER — Other Ambulatory Visit: Payer: Self-pay | Admitting: Radiology

## 2019-10-15 ENCOUNTER — Other Ambulatory Visit: Payer: Self-pay | Admitting: Internal Medicine

## 2019-10-15 MED ORDER — METOPROLOL SUCCINATE ER 25 MG PO TB24: 25.0000 mg | ORAL_TABLET | Freq: Every day | ORAL | 1 refills | Status: DC

## 2019-10-15 NOTE — Telephone Encounter (Signed)
Pt's medication was sent to pt's pharmacy as requested. Confirmation received.  °

## 2020-01-29 ENCOUNTER — Other Ambulatory Visit: Payer: Self-pay | Admitting: Neurological Surgery

## 2020-01-29 DIAGNOSIS — M5412 Radiculopathy, cervical region: Secondary | ICD-10-CM

## 2020-02-18 ENCOUNTER — Other Ambulatory Visit: Payer: Self-pay

## 2020-02-18 ENCOUNTER — Ambulatory Visit
Admission: RE | Admit: 2020-02-18 | Discharge: 2020-02-18 | Disposition: A | Discharge status: Home / Self Care | Payer: Medicare PPO | Source: Ambulatory Visit | Attending: Neurological Surgery | Admitting: Neurological Surgery

## 2020-02-18 DIAGNOSIS — M5412 Radiculopathy, cervical region: Secondary | ICD-10-CM

## 2020-04-03 ENCOUNTER — Other Ambulatory Visit: Payer: Self-pay | Admitting: Internal Medicine

## 2020-04-04 ENCOUNTER — Other Ambulatory Visit: Payer: Self-pay | Admitting: Internal Medicine

## 2020-04-05 ENCOUNTER — Other Ambulatory Visit: Payer: Self-pay | Admitting: Internal Medicine

## 2020-05-02 ENCOUNTER — Other Ambulatory Visit: Payer: Self-pay | Admitting: Internal Medicine

## 2020-05-02 NOTE — Telephone Encounter (Signed)
*  STAT* If patient is at the pharmacy, call can be transferred to refill team.   1. Which medications need to be refilled? (please list name of each medication and dose if known) Metoprolol  2. Which pharmacy/location (including street and city if local pharmacy) is medication to be sent to? [facility] [place], [place]  3. Do they need a 30 day or 90 day supply? Enough until her appt on 06-08-20

## 2020-06-07 ENCOUNTER — Telehealth: Payer: Self-pay

## 2020-06-07 NOTE — Telephone Encounter (Signed)
Left a message regarding virtual appt on 06/08/20.

## 2020-06-08 ENCOUNTER — Other Ambulatory Visit: Payer: Self-pay

## 2020-06-08 ENCOUNTER — Encounter: Payer: Self-pay | Admitting: Internal Medicine

## 2020-06-08 ENCOUNTER — Telehealth (INDEPENDENT_AMBULATORY_CARE_PROVIDER_SITE_OTHER): Payer: Medicare PPO | Admitting: Internal Medicine

## 2020-06-08 VITALS — BP 129/73 | HR 104 | Ht 66.0 in

## 2020-06-08 DIAGNOSIS — I491 Atrial premature depolarization: Secondary | ICD-10-CM | POA: Diagnosis not present

## 2020-06-08 NOTE — Progress Notes (Signed)
Electrophysiology TeleHealth Note   Due to national recommendations of social distancing due to COVID 19, an audio/video telehealth visit is felt to be most appropriate for this patient at this time.  See MyChart message from today for the patient's consent to telehealth for [facility].  Date:  06/08/2020   ID:  [name] Stone, DOB Oct 24, 1951, MRN [id number]  Location: patient's home  Provider location:  [place]  Evaluation Performed: Follow-up visit  PCP:  Lawerance Cruel, MD   Electrophysiologist:  Dr Rayann Heman  Chief Complaint:  palpitations  History of Present Illness:    [name] Stone is a 69 y.o. female who presents via telehealth conferencing today.  Since last being seen in our clinic, the patient reports doing very well.  She is primarily limited by neck pain.  She just completed a 3 mile hike this morning.  Her palpitations are well controlled.  She attributes her improvement to juice plus.  Today, she denies symptoms of chest pain, shortness of breath,  lower extremity edema, dizziness, presyncope, or syncope.  The patient is otherwise without complaint today.   Past Medical History:  Diagnosis Date  . Ankle fracture, left 2005  . Arthritis   . Cancer ([place])    basal cell ca removed from arm  . Complaints of memory disturbance    evaluated by Dr  Marijean Bravo  . Dysrhythmia    Has PACs  . Esophageal stricture   . Esophagitis   . Family history of adverse reaction to anesthesia    MOTHER ALWAYS HAS N/V WITH SURGERY   . GERD (gastroesophageal reflux disease)   . History of environmental allergies    GETS HIVES OCCASIONALLY WHEN NOT USING JUICE PLUS PILLS   . Pneumonia    years ago  . Premature atrial contractions    followed by DR Wynonia Lawman     Past Surgical History:  Procedure Laterality Date  . ABDOMINAL HYSTERECTOMY  1998  . ANTERIOR CERVICAL DECOMP/DISCECTOMY FUSION  12/11/2011   Procedure: ANTERIOR CERVICAL DECOMPRESSION/DISCECTOMY FUSION 2 LEVELS;   Surgeon: Earleen Newport;  Location: [facility];  Service: Neurosurgery;  Laterality: N/A;  C5-6 C6-7 Anterior cervical decompression/diskectomy fusion  . ANTERIOR FUSION CERVICAL SPINE  2000  . COLONOSCOPY     every 5 years for FH colon polyps  . CORNEAL TRANSPLANT    . ESOPHAGEAL MANOMETRY N/A 05/25/2016   Procedure: ESOPHAGEAL MANOMETRY (EM);  Surgeon: Manus Gunning, MD;  Location: [facility];  Service: Gastroenterology;  Laterality: N/A;  . EYE SURGERY     x 5 right eye  . INCONTINENCE SURGERY    . KNEE ARTHROSCOPY Right 2015  . KNEE ARTHROSCOPY W/ LATERAL RELEASE Right   . KNEE ARTHROSCOPY W/ MENISCAL REPAIR Left   . KNEE CLOSED REDUCTION Right 02/10/2018   Procedure: CLOSED MANIPULATION RIGHT KNEE;  Surgeon: Latanya Maudlin, MD;  Location: [facility];  Service: Orthopedics;  Laterality: Right;  . NISSEN FUNDOPLICATION  40/98/1191  . SCAR DEBRIDEMENT OF TOTAL KNEE Right 06/11/2019   Procedure: Irrigation and debridement excision of scar right total knee arthroplasty, with  poly revision;  Surgeon: Paralee Cancel, MD;  Location: [facility];  Service: Orthopedics;  Laterality: Right;  90 mins  . TOTAL KNEE ARTHROPLASTY Right 12/04/2017   Procedure: RIGHT TOTAL KNEE ARTHROPLASTY;  Surgeon: Latanya Maudlin, MD;  Location: [facility];  Service: Orthopedics;  Laterality: Right;    Current Outpatient Medications  Medication Sig Dispense Refill  . acetaminophen (TYLENOL) 500  MG tablet Take 2 tablets (1,000 mg total) by mouth every 8 (eight) hours. 30 tablet 0  . cetirizine (ZYRTEC) 10 MG tablet Take 10 mg by mouth daily.     . Cholecalciferol 25 MCG (1000 UT) tablet Take 1 tablet by mouth daily.    Marland Kitchen diltiazem (CARDIZEM) 30 MG tablet Take 30 mg by mouth daily as needed (esophageal stricture).     Marland Kitchen docusate sodium (COLACE) 100 MG capsule Take 1 capsule (100 mg total) by mouth 2 (two) times daily. 10 capsule 0  . esomeprazole (NEXIUM) 40 MG capsule Take 40 mg by mouth daily.     Marland Kitchen estradiol  (CLIMARA - DOSED IN MG/24 HR) 0.0375 mg/24hr patch Place 1 patch onto the skin 2 (two) times a week.     . ferrous sulfate (FERROUSUL) 325 (65 FE) MG tablet Take 1 tablet (325 mg total) by mouth 3 (three) times daily with meals.  3  . LOTEMAX 0.5 % ophthalmic suspension Place 1 drop into the right eye every Wednesday.   11  . meclizine (ANTIVERT) 25 MG tablet Take 1 tablet by mouth daily as needed for dizziness.    . methocarbamol (ROBAXIN) 500 MG tablet Take 1 tablet (500 mg total) by mouth every 6 (six) hours as needed for muscle spasms. 40 tablet 0  . metoprolol succinate (TOPROL-XL) 25 MG 24 hr tablet TAKE 1 TABLET(25 MG) BY MOUTH DAILY 90 tablet 0  . ondansetron (ZOFRAN) 4 MG tablet Take 1 tablet (4 mg total) by mouth every 8 (eight) hours as needed for nausea or vomiting. 20 tablet 0  . oxyCODONE (OXY IR/ROXICODONE) 5 MG immediate release tablet Take 1-2 tablets (5-10 mg total) by mouth every 4 (four) hours as needed for moderate pain or severe pain. 60 tablet 0  . polyethylene glycol (MIRALAX / GLYCOLAX) 17 g packet Take 17 g by mouth 2 (two) times daily. 14 each 0  . prednisoLONE acetate (PRED FORTE) 1 % ophthalmic suspension Place 1 drop into the left eye daily.    . rivaroxaban (XARELTO) 10 MG TABS tablet Take 1 tablet (10 mg total) by mouth daily with breakfast. 14 tablet 0  . sodium chloride (MURO 128) 5 % ophthalmic solution Place 1 drop into both eyes as needed for eye irritation.     No current facility-administered medications for this visit.    Allergies:   Aspirin and Demerol [meperidine hcl]   Social History:  The patient  reports that she has never smoked. She has never used smokeless tobacco. She reports that she does not drink alcohol or use drugs.   ROS:  Please see the history of present illness.   All other systems are personally reviewed and negative.    Exam:    Vital Signs:  Ht 5\' 6"  (1.676 m)   BMI 28.89 kg/m   Well sounding and appearing, alert and  conversant, regular work of breathing,  good skin color Eyes- anicteric, neuro- grossly intact, skin- no apparent rash or lesions or cyanosis, mouth- oral mucosa is pink  Labs/Other Tests and Data Reviewed:    Recent Labs: 06/12/2019: BUN 16; Creatinine, Ser 0.61; Hemoglobin 11.3; Platelets 241; Potassium 4.4; Sodium 140   Wt Readings from Last 3 Encounters:  06/11/19 179 lb (81.2 kg)  06/04/19 179 lb (81.2 kg)  02/10/18 157 lb 12.8 oz (71.6 kg)     ASSESSMENT & PLAN:    1.  PACs Well controlled No changes   Risks, benefits and potential toxicities for  medications prescribed and/or refilled reviewed with patient today.   Follow-up:  12 months with me   Patient Risk:  after full review of this patients clinical status, I feel that they are at moderate risk at this time.  Today, I have spent 15 minutes with the patient with telehealth technology discussing arrhythmia management .    Army Fossa, MD  06/08/2020 9:54 AM     [place] [address] [phone] (office) [phone] (fax)

## 2020-06-21 DIAGNOSIS — M5030 Other cervical disc degeneration, unspecified cervical region: Secondary | ICD-10-CM | POA: Diagnosis not present

## 2020-07-13 ENCOUNTER — Other Ambulatory Visit: Payer: Self-pay | Admitting: Internal Medicine

## 2020-07-13 MED ORDER — METOPROLOL SUCCINATE ER 25 MG PO TB24: ORAL_TABLET | ORAL | 3 refills | Status: DC

## 2020-07-20 DIAGNOSIS — R05 Cough: Secondary | ICD-10-CM | POA: Diagnosis not present

## 2020-07-21 DIAGNOSIS — R05 Cough: Secondary | ICD-10-CM | POA: Diagnosis not present

## 2020-07-29 DIAGNOSIS — M5412 Radiculopathy, cervical region: Secondary | ICD-10-CM | POA: Diagnosis not present

## 2020-08-03 DIAGNOSIS — Z20828 Contact with and (suspected) exposure to other viral communicable diseases: Secondary | ICD-10-CM | POA: Diagnosis not present

## 2020-08-15 DIAGNOSIS — J029 Acute pharyngitis, unspecified: Secondary | ICD-10-CM | POA: Diagnosis not present

## 2020-08-19 DIAGNOSIS — M961 Postlaminectomy syndrome, not elsewhere classified: Secondary | ICD-10-CM | POA: Diagnosis not present

## 2020-08-29 DIAGNOSIS — R05 Cough: Secondary | ICD-10-CM | POA: Diagnosis not present

## 2020-08-29 DIAGNOSIS — K219 Gastro-esophageal reflux disease without esophagitis: Secondary | ICD-10-CM | POA: Diagnosis not present

## 2020-09-07 DIAGNOSIS — Z419 Encounter for procedure for purposes other than remedying health state, unspecified: Secondary | ICD-10-CM | POA: Diagnosis not present

## 2020-09-07 DIAGNOSIS — L820 Inflamed seborrheic keratosis: Secondary | ICD-10-CM | POA: Diagnosis not present

## 2020-09-07 DIAGNOSIS — D485 Neoplasm of uncertain behavior of skin: Secondary | ICD-10-CM | POA: Diagnosis not present

## 2020-09-07 DIAGNOSIS — L738 Other specified follicular disorders: Secondary | ICD-10-CM | POA: Diagnosis not present

## 2020-09-07 DIAGNOSIS — L821 Other seborrheic keratosis: Secondary | ICD-10-CM | POA: Diagnosis not present

## 2020-09-07 DIAGNOSIS — L720 Epidermal cyst: Secondary | ICD-10-CM | POA: Diagnosis not present

## 2020-09-07 DIAGNOSIS — D1801 Hemangioma of skin and subcutaneous tissue: Secondary | ICD-10-CM | POA: Diagnosis not present

## 2020-09-21 DIAGNOSIS — R928 Other abnormal and inconclusive findings on diagnostic imaging of breast: Secondary | ICD-10-CM | POA: Diagnosis not present

## 2020-09-30 DIAGNOSIS — Z124 Encounter for screening for malignant neoplasm of cervix: Secondary | ICD-10-CM | POA: Diagnosis not present

## 2020-09-30 DIAGNOSIS — Z6830 Body mass index (BMI) 30.0-30.9, adult: Secondary | ICD-10-CM | POA: Diagnosis not present

## 2020-09-30 DIAGNOSIS — N952 Postmenopausal atrophic vaginitis: Secondary | ICD-10-CM | POA: Diagnosis not present

## 2020-09-30 DIAGNOSIS — L900 Lichen sclerosus et atrophicus: Secondary | ICD-10-CM | POA: Diagnosis not present

## 2020-09-30 DIAGNOSIS — N959 Unspecified menopausal and perimenopausal disorder: Secondary | ICD-10-CM | POA: Diagnosis not present

## 2020-12-19 DIAGNOSIS — M25552 Pain in left hip: Secondary | ICD-10-CM | POA: Diagnosis not present

## 2020-12-19 DIAGNOSIS — M5459 Other low back pain: Secondary | ICD-10-CM | POA: Diagnosis not present

## 2020-12-27 ENCOUNTER — Other Ambulatory Visit: Payer: Self-pay

## 2020-12-27 ENCOUNTER — Emergency Department
Admission: RE | Admit: 2020-12-27 | Discharge: 2020-12-27 | Disposition: A | Discharge status: Home / Self Care | Payer: Medicare PPO | Source: Ambulatory Visit

## 2020-12-27 VITALS — BP 151/87 | HR 79 | Temp 98.7°F | Resp 16

## 2020-12-27 DIAGNOSIS — R059 Cough, unspecified: Secondary | ICD-10-CM | POA: Diagnosis not present

## 2020-12-27 DIAGNOSIS — J069 Acute upper respiratory infection, unspecified: Secondary | ICD-10-CM

## 2020-12-27 LAB — POC SARS CORONAVIRUS 2 AG -  ED: SARS Coronavirus 2 Ag: NEGATIVE

## 2020-12-27 MED ORDER — HYDROCOD POLST-CPM POLST ER 10-8 MG/5ML PO SUER: 5.0000 mL | Freq: Two times a day (BID) | ORAL | 0 refills | Status: DC | PRN

## 2020-12-27 NOTE — Discharge Instructions (Signed)
Your rapid Covid test is negative.  We are now going to run a 2-day test that will look for not only Covid but also RSV and influenza.  Try to remain socially isolated as much as possible until you get the results.

## 2020-12-27 NOTE — ED Provider Notes (Signed)
[facility]    CSN: [id number] Arrival date & time: 12/27/20  1841      History   Chief Complaint Chief Complaint  Patient presents with  . Appointment  . Cough    HPI [name] Stone is a 69 y.o. female.   Initial [place] urgent care patient  Patient presents to Urgent Care with complaints of cough. Patient reports she has been running a fever since this morning, took mucinex w/ fever reducer. SOB this morning, checked her O2 sats at home and they were 95%. Pt has been vaccinated for covid and the flu.  Smell and taste are intact.  Early this morning patient was running a fever of 100.  She has no history of asthma.  Patient is a former Herbalist and school principal     Past Medical History:  Diagnosis Date  . Ankle fracture, left 2005  . Arthritis   . Cancer ([place])    basal cell ca removed from arm  . Complaints of memory disturbance    evaluated by Dr  Marijean Bravo  . Dysrhythmia    Has PACs  . Esophageal stricture   . Esophagitis   . Family history of adverse reaction to anesthesia    MOTHER ALWAYS HAS N/V WITH SURGERY   . GERD (gastroesophageal reflux disease)   . History of environmental allergies    GETS HIVES OCCASIONALLY WHEN NOT USING JUICE PLUS PILLS   . Pneumonia    years ago  . Premature atrial contractions    followed by DR Wynonia Lawman     Patient Active Problem List   Diagnosis Date Noted  . Arthrofibrosis of knee joint, right 06/11/2019  . Hx of total knee arthroplasty, right 12/04/2017  . Atypical chest pain   . Mild cognitive impairment with memory loss 04/29/2013  . Cervical spondylosis with radiculopathy 12/11/2011  . FECAL OCCULT BLOOD 02/18/2009  . ESOPHAGEAL STRICTURE 02/16/2009  . GERD 02/16/2009  . MENOPAUSE, SURGICAL 02/24/2008  . SKIN CANCER, HX OF 02/24/2008  . Personal history of other diseases of digestive system 02/24/2008    Past Surgical History:  Procedure Laterality Date  . ABDOMINAL  HYSTERECTOMY  1998  . ANTERIOR CERVICAL DECOMP/DISCECTOMY FUSION  12/11/2011   Procedure: ANTERIOR CERVICAL DECOMPRESSION/DISCECTOMY FUSION 2 LEVELS;  Surgeon: Earleen Newport;  Location: [facility];  Service: Neurosurgery;  Laterality: N/A;  C5-6 C6-7 Anterior cervical decompression/diskectomy fusion  . ANTERIOR FUSION CERVICAL SPINE  2000  . COLONOSCOPY     every 5 years for FH colon polyps  . CORNEAL TRANSPLANT    . ESOPHAGEAL MANOMETRY N/A 05/25/2016   Procedure: ESOPHAGEAL MANOMETRY (EM);  Surgeon: Manus Gunning, MD;  Location: [facility];  Service: Gastroenterology;  Laterality: N/A;  . EYE SURGERY     x 5 right eye  . INCONTINENCE SURGERY    . KNEE ARTHROSCOPY Right 2015  . KNEE ARTHROSCOPY W/ LATERAL RELEASE Right   . KNEE ARTHROSCOPY W/ MENISCAL REPAIR Left   . KNEE CLOSED REDUCTION Right 02/10/2018   Procedure: CLOSED MANIPULATION RIGHT KNEE;  Surgeon: Latanya Maudlin, MD;  Location: [facility];  Service: Orthopedics;  Laterality: Right;  . NISSEN FUNDOPLICATION  [id number]  . SCAR DEBRIDEMENT OF TOTAL KNEE Right 06/11/2019   Procedure: Irrigation and debridement excision of scar right total knee arthroplasty, with  poly revision;  Surgeon: Paralee Cancel, MD;  Location: [facility];  Service: Orthopedics;  Laterality: Right;  90 mins  . TOTAL KNEE ARTHROPLASTY Right 12/04/2017  Procedure: RIGHT TOTAL KNEE ARTHROPLASTY;  Surgeon: Ranee Gosselin, MD;  Location: [facility];  Service: Orthopedics;  Laterality: Right;    OB History   No obstetric history on file.      Home Medications    Prior to Admission medications   Medication Sig Start Date End Date Taking? Authorizing Provider  chlorpheniramine-HYDROcodone (TUSSIONEX PENNKINETIC ER) 10-8 MG/5ML SUER Take 5 mLs by mouth every 12 (twelve) hours as needed for cough. 12/27/20  Yes Elvina Sidle, MD  acetaminophen (TYLENOL) 500 MG tablet Take 2 tablets (1,000 mg total) by mouth every 8 (eight) hours. 06/12/19   Lanney Gins, PA-C  cetirizine (ZYRTEC) 10 MG tablet Take 10 mg by mouth daily.     [provider]  Cholecalciferol 25 MCG (1000 UT) tablet Take 1 tablet by mouth daily.    [provider]  diltiazem (CARDIZEM) 30 MG tablet Take 30 mg by mouth daily as needed (esophageal stricture).  03/25/17   [provider]  esomeprazole (NEXIUM) 40 MG capsule Take 40 mg by mouth daily.     [provider]  estradiol (CLIMARA - DOSED IN MG/24 HR) 0.0375 mg/24hr patch Place 1 patch onto the skin 2 (two) times a week.     [provider]  ferrous sulfate (FERROUSUL) 325 (65 FE) MG tablet Take 1 tablet (325 mg total) by mouth 3 (three) times daily with meals. 06/12/19   Lanney Gins, PA-C  LOTEMAX 0.5 % ophthalmic suspension Place 1 drop into the right eye every Wednesday.  10/09/17   [provider]  meclizine (ANTIVERT) 25 MG tablet Take 1 tablet by mouth daily as needed for dizziness. 03/02/20   [provider]  metoprolol succinate (TOPROL-XL) 25 MG 24 hr tablet TAKE 1 TABLET(25 MG) BY MOUTH DAILY 07/13/20   Allred, Fayrene Fearing, MD  ondansetron (ZOFRAN) 4 MG tablet Take 1 tablet (4 mg total) by mouth every 8 (eight) hours as needed for nausea or vomiting. 06/12/19   Lanney Gins, PA-C  prednisoLONE acetate (PRED FORTE) 1 % ophthalmic suspension Place 1 drop into the left eye daily.    [provider]  rivaroxaban (XARELTO) 10 MG TABS tablet Take 1 tablet (10 mg total) by mouth daily with breakfast. 06/12/19   Lanney Gins, PA-C  sodium chloride (MURO 128) 5 % ophthalmic solution Place 1 drop into both eyes as needed for eye irritation.    [provider]    Family History Family History  Problem Relation Age of Onset  . Skin cancer Father   . Bladder Cancer Father   . Skin cancer Brother   . Skin cancer Paternal Grandfather   . Colon polyps Brother   . Colitis Mother   . Colon cancer Neg Hx     Social History Social History    Tobacco Use  . Smoking status: Never Smoker  . Smokeless tobacco: Never Used  Vaping Use  . Vaping Use: Never used  Substance Use Topics  . Alcohol use: No  . Drug use: No     Allergies   Aspirin and Demerol [meperidine hcl]   Review of Systems Review of Systems  Constitutional: Positive for fever.  HENT: Positive for congestion.   Respiratory: Positive for cough, shortness of breath and wheezing.      Physical Exam Triage Vital Signs ED Triage Vitals  Enc Vitals Group     BP 12/27/20 1858 (!) 151/87     Pulse Rate 12/27/20 1858 79     Resp  12/27/20 1858 16     Temp 12/27/20 1858 98.7 F (37.1 C)     Temp Source 12/27/20 1858 Oral     SpO2 12/27/20 1858 95 %     Weight --      Height --      Head Circumference --      Peak Flow --      Pain Score 12/27/20 1855 0     Pain Loc --      Pain Edu? --      Excl. in [place]? --    No data found.  Updated Vital Signs BP (!) 151/87 (BP Location: Right Arm)   Pulse 79   Temp 98.7 F (37.1 C) (Oral)   Resp 16   SpO2 95%    Physical Exam Vitals and nursing note reviewed.  Constitutional:      Appearance: Normal appearance.  HENT:     Head: Normocephalic.     Nose: Congestion present.     Mouth/Throat:     Mouth: Mucous membranes are moist.     Pharynx: Oropharynx is clear.  Eyes:     Conjunctiva/sclera: Conjunctivae normal.  Cardiovascular:     Rate and Rhythm: Normal rate.     Heart sounds: Normal heart sounds.  Pulmonary:     Effort: Pulmonary effort is normal.     Breath sounds: Wheezing present.  Musculoskeletal:        General: Normal range of motion.     Cervical back: Normal range of motion and neck supple.  Skin:    General: Skin is warm and dry.  Neurological:     General: No focal deficit present.     Mental Status: She is alert and oriented to person, place, and time.  Psychiatric:        Mood and Affect: Mood normal.        Thought Content: Thought content normal.      UC  Treatments / Results  Labs (all labs ordered are listed, but only abnormal results are displayed) Labs Reviewed  POC SARS CORONAVIRUS 2 AG -  ED    EKG   Radiology No results found.  Procedures Procedures (including critical care time)  Medications Ordered in UC Medications - No data to display  Initial Impression / Assessment and Plan / UC Course  I have reviewed the triage vital signs and the nursing notes.  Pertinent labs & imaging results that were available during my care of the patient were reviewed by me and considered in my medical decision making (see chart for details).      Final Clinical Impressions(s) / UC Diagnoses   Final diagnoses:  Viral URI with cough     Discharge Instructions     Your rapid Covid test is negative.  We are now going to run a 2-day test that will look for not only Covid but also RSV and influenza.  Try to remain socially isolated as much as possible until you get the results.    ED Prescriptions    Medication Sig Dispense Auth. Provider   chlorpheniramine-HYDROcodone (TUSSIONEX PENNKINETIC ER) 10-8 MG/5ML SUER Take 5 mLs by mouth every 12 (twelve) hours as needed for cough. 115 mL Robyn Haber, MD     I have reviewed the PDMP during this encounter.   Robyn Haber, MD 12/27/20 Karl Bales

## 2020-12-27 NOTE — ED Triage Notes (Signed)
Patient presents to Urgent Care with complaints of cough. Patient reports she has bene running a fever since this morning, took mucinex w/ fever reducer. SOB this morning, checked her O2 sats at home and they were 95%. Pt has been vaccinated for covid and the flu.

## 2020-12-28 ENCOUNTER — Telehealth: Payer: Self-pay

## 2020-12-28 MED ORDER — ALBUTEROL SULFATE HFA 108 (90 BASE) MCG/ACT IN AERS: 1.0000 | INHALATION_SPRAY | Freq: Four times a day (QID) | RESPIRATORY_TRACT | 0 refills | Status: DC | PRN

## 2020-12-29 ENCOUNTER — Emergency Department (INDEPENDENT_AMBULATORY_CARE_PROVIDER_SITE_OTHER): Payer: Medicare PPO

## 2020-12-29 ENCOUNTER — Emergency Department (INDEPENDENT_AMBULATORY_CARE_PROVIDER_SITE_OTHER)
Admission: EM | Admit: 2020-12-29 | Discharge: 2020-12-29 | Disposition: A | Discharge status: Home / Self Care | Payer: Medicare PPO | Source: Home / Self Care

## 2020-12-29 ENCOUNTER — Other Ambulatory Visit: Payer: Self-pay

## 2020-12-29 ENCOUNTER — Emergency Department: Admit: 2020-12-29 | Discharge status: Absent without leave | Payer: Self-pay

## 2020-12-29 DIAGNOSIS — J205 Acute bronchitis due to respiratory syncytial virus: Secondary | ICD-10-CM | POA: Diagnosis not present

## 2020-12-29 DIAGNOSIS — R059 Cough, unspecified: Secondary | ICD-10-CM | POA: Diagnosis not present

## 2020-12-29 LAB — COVID-19, FLU A+B AND RSV
Influenza A, NAA: NOT DETECTED
Influenza B, NAA: NOT DETECTED
RSV, NAA: DETECTED — AB
SARS-CoV-2, NAA: NOT DETECTED

## 2020-12-29 NOTE — ED Provider Notes (Signed)
[name]    CSN: [id number] Arrival date & time: 12/29/20  1256      History   Chief Complaint Chief Complaint  Patient presents with  . Weakness    Recently diagnosed w/ RSV    HPI [name] Stone is a 69 y.o. female.   HPI  [name] Stone is a 69 y.o. female presenting to UC with c/o generalized weakness, decreased O2 sat at home this morning around 88-92*F.  O2 went up later in the day after she had been up for a few hours and coughed some but pt wants to make sure she is okay.  She was [facility] at [facility] two days ago, tested negative for COVID and Flu but tested positive for RSV.  Denies fever, chills, n/v/d. No chest pain or SOB at this time.  Pt has an albuterol inhaler at home and is on end of a taper medrol dose pack from unrelated issue.      Past Medical History:  Diagnosis Date  . Ankle fracture, left 2005  . Arthritis   . Cancer ([facility])    basal cell ca removed from arm  . Complaints of memory disturbance    evaluated by Dr  [name]  . Dysrhythmia    Has PACs  . Esophageal stricture   . Esophagitis   . Family history of adverse reaction to anesthesia    MOTHER ALWAYS HAS N/V WITH SURGERY   . GERD (gastroesophageal reflux disease)   . History of environmental allergies    GETS HIVES OCCASIONALLY WHEN NOT USING JUICE PLUS PILLS   . Pneumonia    years ago  . Premature atrial contractions    followed by DR Donnie Aho     Patient Active Problem List   Diagnosis Date Noted  . Arthrofibrosis of knee joint, right 06/11/2019  . Hx of total knee arthroplasty, right 12/04/2017  . Atypical chest pain   . Mild cognitive impairment with memory loss 04/29/2013  . Cervical spondylosis with radiculopathy 12/11/2011  . FECAL OCCULT BLOOD 02/18/2009  . ESOPHAGEAL STRICTURE 02/16/2009  . GERD 02/16/2009  . MENOPAUSE, SURGICAL 02/24/2008  . SKIN CANCER, HX OF 02/24/2008  . Personal history of other diseases of digestive system 02/24/2008    Past Surgical History:   Procedure Laterality Date  . ABDOMINAL HYSTERECTOMY  1998  . ANTERIOR CERVICAL DECOMP/DISCECTOMY FUSION  12/11/2011   Procedure: ANTERIOR CERVICAL DECOMPRESSION/DISCECTOMY FUSION 2 LEVELS;  Surgeon: Stefani Dama;  Location: [facility];  Service: Neurosurgery;  Laterality: N/A;  C5-6 C6-7 Anterior cervical decompression/diskectomy fusion  . ANTERIOR FUSION CERVICAL SPINE  2000  . COLONOSCOPY     every 5 years for FH colon polyps  . CORNEAL TRANSPLANT    . ESOPHAGEAL MANOMETRY N/A 05/25/2016   Procedure: ESOPHAGEAL MANOMETRY (EM);  Surgeon: Ruffin Frederick, MD;  Location: [facility];  Service: Gastroenterology;  Laterality: N/A;  . EYE SURGERY     x 5 right eye  . INCONTINENCE SURGERY    . KNEE ARTHROSCOPY Right 2015  . KNEE ARTHROSCOPY W/ LATERAL RELEASE Right   . KNEE ARTHROSCOPY W/ MENISCAL REPAIR Left   . KNEE CLOSED REDUCTION Right 02/10/2018   Procedure: CLOSED MANIPULATION RIGHT KNEE;  Surgeon: Ranee Gosselin, MD;  Location: [facility];  Service: Orthopedics;  Laterality: Right;  . NISSEN FUNDOPLICATION  04/24/2017  . SCAR DEBRIDEMENT OF TOTAL KNEE Right 06/11/2019   Procedure: Irrigation and debridement excision of scar right total knee arthroplasty, with  poly revision;  Surgeon: Paralee Cancel, MD;  Location: [facility];  Service: Orthopedics;  Laterality: Right;  90 mins  . TOTAL KNEE ARTHROPLASTY Right 12/04/2017   Procedure: RIGHT TOTAL KNEE ARTHROPLASTY;  Surgeon: Latanya Maudlin, MD;  Location: [facility];  Service: Orthopedics;  Laterality: Right;    OB History   No obstetric history on file.      Home Medications    Prior to Admission medications   Medication Sig Start Date End Date Taking? Authorizing Provider  predniSONE (DELTASONE) 10 MG tablet Take 10 mg by mouth daily with breakfast. Pt reports she is tapering off; has three more days left of dose pack.   Yes [provider]  acetaminophen (TYLENOL) 500 MG tablet Take 2 tablets (1,000 mg total) by mouth  every 8 (eight) hours. 06/12/19   Danae Orleans, PA-C  albuterol (VENTOLIN HFA) 108 (90 Base) MCG/ACT inhaler Inhale 1-2 puffs into the lungs every 6 (six) hours as needed for wheezing or shortness of breath. 12/28/20   Robyn Haber, MD  cetirizine (ZYRTEC) 10 MG tablet Take 10 mg by mouth daily.     [provider]  chlorpheniramine-HYDROcodone (TUSSIONEX PENNKINETIC ER) 10-8 MG/5ML SUER Take 5 mLs by mouth every 12 (twelve) hours as needed for cough. 12/27/20   Robyn Haber, MD  Cholecalciferol 25 MCG (1000 UT) tablet Take 1 tablet by mouth daily.    [provider]  diltiazem (CARDIZEM) 30 MG tablet Take 30 mg by mouth daily as needed (esophageal stricture).  03/25/17   [provider]  esomeprazole (NEXIUM) 40 MG capsule Take 40 mg by mouth daily.     [provider]  estradiol (CLIMARA - DOSED IN MG/24 HR) 0.0375 mg/24hr patch Place 1 patch onto the skin 2 (two) times a week.     [provider]  ferrous sulfate (FERROUSUL) 325 (65 FE) MG tablet Take 1 tablet (325 mg total) by mouth 3 (three) times daily with meals. 06/12/19   Danae Orleans, PA-C  LOTEMAX 0.5 % ophthalmic suspension Place 1 drop into the right eye every Wednesday.  10/09/17   [provider]  meclizine (ANTIVERT) 25 MG tablet Take 1 tablet by mouth daily as needed for dizziness. 03/02/20   [provider]  metoprolol succinate (TOPROL-XL) 25 MG 24 hr tablet TAKE 1 TABLET(25 MG) BY MOUTH DAILY 07/13/20   Allred, Jeneen Rinks, MD  ondansetron (ZOFRAN) 4 MG tablet Take 1 tablet (4 mg total) by mouth every 8 (eight) hours as needed for nausea or vomiting. 06/12/19   Danae Orleans, PA-C  prednisoLONE acetate (PRED FORTE) 1 % ophthalmic suspension Place 1 drop into the left eye daily.    [provider]  rivaroxaban (XARELTO) 10 MG TABS tablet Take 1 tablet (10 mg total) by mouth daily with breakfast. 06/12/19   Danae Orleans, PA-C  sodium chloride (MURO 128) 5 %  ophthalmic solution Place 1 drop into both eyes as needed for eye irritation.    [provider]    Family History Family History  Problem Relation Age of Onset  . Skin cancer Father   . Bladder Cancer Father   . Skin cancer Brother   . Skin cancer Paternal Grandfather   . Colon polyps Brother   . Colitis Mother   . Colon cancer Neg Hx     Social History Social History   Tobacco Use  . Smoking status: Never Smoker  . Smokeless tobacco: Never Used  Vaping Use  . Vaping Use: Never used  Substance  Use Topics  . Alcohol use: No  . Drug use: No     Allergies   Aspirin and Demerol [meperidine hcl]   Review of Systems Review of Systems  Constitutional: Positive for fatigue. Negative for chills and fever.  HENT: Positive for congestion. Negative for ear pain, sore throat, trouble swallowing and voice change.   Respiratory: Positive for cough. Negative for shortness of breath.   Cardiovascular: Negative for chest pain and palpitations.  Gastrointestinal: Negative for abdominal pain, diarrhea, nausea and vomiting.  Musculoskeletal: Negative for arthralgias, back pain and myalgias.  Skin: Negative for rash.  Neurological: Positive for weakness (generalized) and light-headedness (earlier this morning when O2 was low). Negative for dizziness and headaches.  All other systems reviewed and are negative.    Physical Exam Triage Vital Signs ED Triage Vitals  Enc Vitals Group     BP 12/29/20 1412 (!) 142/89     Pulse Rate 12/29/20 1412 82     Resp 12/29/20 1412 20     Temp 12/29/20 1412 98.8 F (37.1 C)     Temp Source 12/29/20 1412 Oral     SpO2 12/29/20 1412 94 %     Weight 12/29/20 1406 179 lb (81.2 kg)     Height 12/29/20 1406 5\' 5"  (1.651 m)     Head Circumference --      Peak Flow --      Pain Score --      Pain Loc --      Pain Edu? --      Excl. in [place]? --    No data found.  Updated Vital Signs BP (!) 142/89 (BP Location: Right Arm)   Pulse 89    Temp 98.8 F (37.1 C) (Oral)   Resp 20   Ht 5\' 5"  (1.651 m)   Wt 179 lb (81.2 kg)   SpO2 95%   BMI 29.79 kg/m   Visual Acuity Right Eye Distance:   Left Eye Distance:   Bilateral Distance:    Right Eye Near:   Left Eye Near:    Bilateral Near:     Physical Exam Vitals and nursing note reviewed.  Constitutional:      General: She is not in acute distress.    Appearance: Normal appearance. She is well-developed and well-nourished. She is not ill-appearing, toxic-appearing or diaphoretic.     Comments: Pt sitting in exam chair, appears well, NAD  HENT:     Head: Normocephalic and atraumatic.     Right Ear: Tympanic membrane and ear canal normal.     Left Ear: Tympanic membrane and ear canal normal.     Nose: Nose normal.     Right Sinus: No maxillary sinus tenderness or frontal sinus tenderness.     Left Sinus: No maxillary sinus tenderness or frontal sinus tenderness.     Mouth/Throat:     Lips: Pink.     Mouth: Mucous membranes are moist.     Pharynx: Oropharynx is clear. Uvula midline.  Eyes:     Extraocular Movements: EOM normal.  Cardiovascular:     Rate and Rhythm: Normal rate and regular rhythm.  Pulmonary:     Effort: Pulmonary effort is normal. No respiratory distress.     Breath sounds: No stridor. Wheezing (faint difuse ) present. No rhonchi or rales.  Musculoskeletal:        General: Normal range of motion.     Cervical back: Normal range of motion and neck supple. No tenderness.  Lymphadenopathy:  Cervical: No cervical adenopathy.  Skin:    General: Skin is warm and dry.  Neurological:     Mental Status: She is alert and oriented to person, place, and time.  Psychiatric:        Mood and Affect: Mood and affect normal.        Behavior: Behavior normal.      UC Treatments / Results  Labs (all labs ordered are listed, but only abnormal results are displayed) Labs Reviewed - No data to display  EKG   Radiology DG Chest 2 View  Result Date:  12/29/2020 CLINICAL DATA:  Cough, congestion, low O2 at home. EXAM: CHEST - 2 VIEW COMPARISON:  June 21, 2020 FINDINGS: The heart size and mediastinal contours are within normal limits. Aortic atherosclerosis. No consolidation. Chronic left basilar atelectasis/scar. No visible pleural effusions or pneumothorax. No acute osseous abnormality. Partially imaged cervical ACDF. Remote midthoracic vertebral body fracture with similar severe height loss and focal kyphosis. IMPRESSION: No acute cardiopulmonary disease. Chronic left basilar atelectasis/scar. Electronically Signed   By: Feliberto Harts MD   On: 12/29/2020 14:45    Procedures Procedures (including critical care time)  Medications Ordered in UC Medications - No data to display  Initial Impression / Assessment and Plan / UC Course  I have reviewed the triage vital signs and the nursing notes.  Pertinent labs & imaging results that were available during my care of the patient were reviewed by me and considered in my medical decision making (see chart for details).     Ambulatory O2 stayed around 94-95% with HR 77-80bpm Discussed CXR with pt Pt feels comfortable being discharged home Discussed symptoms that warrant emergent care in the ED. Pt verbalized understanding and agreement with tx plan AVS given   Final Clinical Impressions(s) / UC Diagnoses   Final diagnoses:  RSV bronchitis     Discharge Instructions      Be sure to stay well hydrated, sleep elevated. Try to take normal breaths and cough when needed to help break up the congestion in your chest. Follow up with primary care next week for recheck of symptoms, especially if not improving over the weekend.   Call 911 or have someone drive you to the hospital if symptoms significantly worsening- especially if your oxygen drops below 92% again, heart rate remaining above 100, dizziness/passing out, or other new concerning symptoms develop.      ED Prescriptions     None     PDMP not reviewed this encounter.   Lurene Shadow, [place] 12/30/20 [phone]

## 2020-12-29 NOTE — ED Triage Notes (Signed)
Pt presents to Urgent Care with c/o weakness and decreased O2 sats at home after recently being dx w/ RSV. Pt reports her O2 sats were 88-92 percent for a few hours this morning.

## 2020-12-29 NOTE — Discharge Instructions (Addendum)
  Be sure to stay well hydrated, sleep elevated. Try to take normal breaths and cough when needed to help break up the congestion in your chest. Follow up with primary care next week for recheck of symptoms, especially if not improving over the weekend.   Call 911 or have someone drive you to the hospital if symptoms significantly worsening- especially if your oxygen drops below 92% again, heart rate remaining above 100, dizziness/passing out, or other new concerning symptoms develop.

## 2020-12-29 NOTE — ED Notes (Signed)
Per PA order, pt walked around unit twice while monitoring pulse ox. Before starting, SpO2 95%, HR 90. Pulse ox remained 94-95 % (on room air) and HR between 89-91 while and after pt ambulated. She states she is "only a little" short of breath. Dario Guardian, PA notified.

## 2020-12-30 ENCOUNTER — Telehealth: Payer: Self-pay | Admitting: Emergency Medicine

## 2020-12-30 NOTE — Telephone Encounter (Signed)
Follow up call to [facility] to see how she was feeling today - confirmed identifiers- Andrey Campanile stated her O2 saturation was 94%- had some wheezing last night, but used her inhaler and felt better. Slept through the night for the 1st time in a week last night- provider Waylan Rocher, [place]) verbally updated by RN

## 2021-01-09 DIAGNOSIS — Z1272 Encounter for screening for malignant neoplasm of vagina: Secondary | ICD-10-CM | POA: Diagnosis not present

## 2021-02-23 DIAGNOSIS — E559 Vitamin D deficiency, unspecified: Secondary | ICD-10-CM | POA: Diagnosis not present

## 2021-02-23 DIAGNOSIS — K219 Gastro-esophageal reflux disease without esophagitis: Secondary | ICD-10-CM | POA: Diagnosis not present

## 2021-02-23 DIAGNOSIS — E7800 Pure hypercholesterolemia, unspecified: Secondary | ICD-10-CM | POA: Diagnosis not present

## 2021-02-23 DIAGNOSIS — Z79899 Other long term (current) drug therapy: Secondary | ICD-10-CM | POA: Diagnosis not present

## 2021-02-23 DIAGNOSIS — M7062 Trochanteric bursitis, left hip: Secondary | ICD-10-CM | POA: Diagnosis not present

## 2021-02-23 DIAGNOSIS — R42 Dizziness and giddiness: Secondary | ICD-10-CM | POA: Diagnosis not present

## 2021-02-23 DIAGNOSIS — Z0000 Encounter for general adult medical examination without abnormal findings: Secondary | ICD-10-CM | POA: Diagnosis not present

## 2021-02-23 DIAGNOSIS — Z23 Encounter for immunization: Secondary | ICD-10-CM | POA: Diagnosis not present

## 2021-03-02 DIAGNOSIS — M25552 Pain in left hip: Secondary | ICD-10-CM | POA: Diagnosis not present

## 2021-03-24 DIAGNOSIS — H40013 Open angle with borderline findings, low risk, bilateral: Secondary | ICD-10-CM | POA: Diagnosis not present

## 2021-03-28 DIAGNOSIS — M25552 Pain in left hip: Secondary | ICD-10-CM | POA: Diagnosis not present

## 2021-04-04 DIAGNOSIS — M545 Low back pain, unspecified: Secondary | ICD-10-CM | POA: Diagnosis not present

## 2021-04-04 DIAGNOSIS — M25552 Pain in left hip: Secondary | ICD-10-CM | POA: Diagnosis not present

## 2021-04-11 DIAGNOSIS — M545 Low back pain, unspecified: Secondary | ICD-10-CM | POA: Diagnosis not present

## 2021-04-13 DIAGNOSIS — M545 Low back pain, unspecified: Secondary | ICD-10-CM | POA: Diagnosis not present

## 2021-04-13 DIAGNOSIS — M5459 Other low back pain: Secondary | ICD-10-CM | POA: Diagnosis not present

## 2021-05-12 DIAGNOSIS — M5416 Radiculopathy, lumbar region: Secondary | ICD-10-CM | POA: Diagnosis not present

## 2021-05-16 DIAGNOSIS — H18513 Endothelial corneal dystrophy, bilateral: Secondary | ICD-10-CM | POA: Diagnosis not present

## 2021-05-16 DIAGNOSIS — H40013 Open angle with borderline findings, low risk, bilateral: Secondary | ICD-10-CM | POA: Diagnosis not present

## 2021-05-16 DIAGNOSIS — Z947 Corneal transplant status: Secondary | ICD-10-CM | POA: Diagnosis not present

## 2021-05-23 DIAGNOSIS — U071 COVID-19: Secondary | ICD-10-CM | POA: Diagnosis not present

## 2021-06-01 DIAGNOSIS — M5416 Radiculopathy, lumbar region: Secondary | ICD-10-CM | POA: Diagnosis not present

## 2021-06-01 DIAGNOSIS — M5116 Intervertebral disc disorders with radiculopathy, lumbar region: Secondary | ICD-10-CM | POA: Diagnosis not present

## 2021-06-12 ENCOUNTER — Encounter: Payer: Self-pay | Admitting: Internal Medicine

## 2021-06-12 ENCOUNTER — Other Ambulatory Visit: Payer: Self-pay

## 2021-06-12 ENCOUNTER — Ambulatory Visit: Payer: Medicare PPO | Admitting: Internal Medicine

## 2021-06-12 ENCOUNTER — Ambulatory Visit (INDEPENDENT_AMBULATORY_CARE_PROVIDER_SITE_OTHER): Payer: Medicare PPO

## 2021-06-12 VITALS — BP 142/84 | HR 88 | Ht 64.0 in | Wt 174.6 lb

## 2021-06-12 DIAGNOSIS — I491 Atrial premature depolarization: Secondary | ICD-10-CM

## 2021-06-12 NOTE — Patient Instructions (Addendum)
Medication Instructions:  Your physician recommends that you continue on your current medications as directed. Please refer to the Current Medication list given to you today.  Labwork: None ordered.  Testing/Procedures: Your physician has requested that you have an echocardiogram. Echocardiography is a painless test that uses sound waves to create images of your heart. It provides your doctor with information about the size and shape of your heart and how well your heart's chambers and valves are working. This procedure takes approximately one hour. There are no restrictions for this procedure.   Your physician has recommended that you wear a heart monitor. Heart monitors are medical devices that record the heart's electrical activity. Doctors most often use these monitors to diagnose arrhythmias. Arrhythmias are problems with the speed or rhythm of the heartbeat. The monitor is a small, portable device. You can wear one while you do your normal daily activities. This is usually used to diagnose what is causing palpitations/syncope (passing out).   Follow-Up: Your physician wants you to follow-up in: 12 months with Thompson Grayer, MD or one of the following Advanced Practice Providers on your designated Care Team:    Tommye Standard, PA-C    You will receive a reminder letter in the mail two months in advance. If you don't receive a letter, please call our office to schedule the follow-up appointment.   Any Other Special Instructions Will Be Listed Below (If Applicable).  If you need a refill on your cardiac medications before your next appointment, please call your pharmacy.   ZIO XT- Long Term Monitor Instructions  Your physician has requested you wear a ZIO patch monitor for 14 days.  This is a single patch monitor. [employer] supplies one patch monitor per enrollment. Additional stickers are not available. Please do not apply patch if you will be having a Nuclear Stress Test,  Echocardiogram,  Cardiac CT, MRI, or Chest Xray during the period you would be wearing the  monitor. The patch cannot be worn during these tests. You cannot remove and re-apply the  ZIO XT patch monitor.  Your ZIO patch monitor will be mailed 3 day USPS to your address on file. It may take 3-5 days  to receive your monitor after you have been enrolled.  Once you have received your monitor, please review the enclosed instructions. Your monitor  has already been registered assigning a specific monitor serial # to you.  Billing and Patient Assistance Program Information  We have supplied [employer] with any of your insurance information on file for billing purposes. [employer] offers a sliding scale Patient Assistance Program for patients that do not have  insurance, or whose insurance does not completely cover the cost of the ZIO monitor.  You must apply for the Patient Assistance Program to qualify for this discounted rate.  To apply, please call [employer] at [phone], select option 4, select option 2, ask to apply for  Patient Assistance Program. Theodore Demark will ask your household income, and how many people  are in your household. They will quote your out-of-pocket cost based on that information.  [employer] will also be able to set up a 45-month, interest-free payment plan if needed.  Applying the monitor   Shave hair from upper left chest.  Hold abrader disc by orange tab. Rub abrader in 40 strokes over the upper left chest as  indicated in your monitor instructions.  Clean area with 4 enclosed alcohol pads. Let dry.  Apply patch as indicated in monitor instructions. Patch will be  placed under collarbone on left  side of chest with arrow pointing upward.  Rub patch adhesive wings for 2 minutes. Remove white label marked "1". Remove the white  label marked "2". Rub patch adhesive wings for 2 additional minutes.  While looking in a mirror, press and release button in center of patch. A small green light will   flash 3-4 times. This will be your only indicator that the monitor has been turned on.  Do not shower for the first 24 hours. You may shower after the first 24 hours.  Press the button if you feel a symptom. You will hear a small click. Record Date, Time and  Symptom in the Patient Logbook.  When you are ready to remove the patch, follow instructions on the last 2 pages of Patient  Logbook. Stick patch monitor onto the last page of Patient Logbook.  Place Patient Logbook in the blue and white box. Use locking tab on box and tape box closed  securely. The blue and white box has prepaid postage on it. Please place it in the mailbox as  soon as possible. Your physician should have your test results approximately 7 days after the  monitor has been mailed back to [facility].  Call Joseph at [phone] if you have questions regarding  your ZIO XT patch monitor. Call them immediately if you see an orange light blinking on your  monitor.  If your monitor falls off in less than 4 days, contact our Monitor department at [phone].  If your monitor becomes loose or falls off after 4 days call [employer] at [phone] for  suggestions on securing your monitor

## 2021-06-12 NOTE — Progress Notes (Signed)
PCP: Lawerance Cruel, MD   Primary EP: Dr Rayann Heman  [name] Stone is a 70 y.o. female who presents today for routine electrophysiology followup.  Since last being seen in our clinic, the patient reports doing very well.  She has had worsening palpitations over the past 2 months.  + occasional "sinking" sensation.  She remains active.  She has had back and hip issues with steroids administered which may have increased her palpitations.  Today, she denies symptoms of chest pain, shortness of breath,  lower extremity edema, dizziness, presyncope, or syncope.  The patient is otherwise without complaint today.   Past Medical History:  Diagnosis Date   Ankle fracture, left 2005   Arthritis    Cancer ([place])    basal cell ca removed from arm   Complaints of memory disturbance    evaluated by Dr  Marijean Bravo   Dysrhythmia    Has PACs   Esophageal stricture    Esophagitis    Family history of adverse reaction to anesthesia    MOTHER ALWAYS HAS N/V WITH SURGERY    GERD (gastroesophageal reflux disease)    History of environmental allergies    GETS HIVES OCCASIONALLY WHEN NOT USING JUICE PLUS PILLS    Pneumonia    years ago   Premature atrial contractions    followed by DR Wynonia Lawman    Past Surgical History:  Procedure Laterality Date   ABDOMINAL HYSTERECTOMY  1998   ANTERIOR CERVICAL DECOMP/DISCECTOMY FUSION  12/11/2011   Procedure: ANTERIOR CERVICAL DECOMPRESSION/DISCECTOMY FUSION 2 LEVELS;  Surgeon: Earleen Newport;  Location: [facility];  Service: Neurosurgery;  Laterality: N/A;  C5-6 C6-7 Anterior cervical decompression/diskectomy fusion   ANTERIOR FUSION CERVICAL SPINE  2000   COLONOSCOPY     every 5 years for FH colon polyps   CORNEAL TRANSPLANT     ESOPHAGEAL MANOMETRY N/A 05/25/2016   Procedure: ESOPHAGEAL MANOMETRY (EM);  Surgeon: Manus Gunning, MD;  Location: [facility];  Service: Gastroenterology;  Laterality: N/A;   EYE SURGERY     x 5 right eye   INCONTINENCE SURGERY      KNEE ARTHROSCOPY Right 2015   KNEE ARTHROSCOPY W/ LATERAL RELEASE Right    KNEE ARTHROSCOPY W/ MENISCAL REPAIR Left    KNEE CLOSED REDUCTION Right 02/10/2018   Procedure: CLOSED MANIPULATION RIGHT KNEE;  Surgeon: Latanya Maudlin, MD;  Location: [facility];  Service: Orthopedics;  Laterality: Right;   NISSEN FUNDOPLICATION  51/[id number]/4166   SCAR DEBRIDEMENT OF TOTAL KNEE Right 06/11/2019   Procedure: Irrigation and debridement excision of scar right total knee arthroplasty, with  poly revision;  Surgeon: Paralee Cancel, MD;  Location: [facility];  Service: Orthopedics;  Laterality: Right;  90 mins   TOTAL KNEE ARTHROPLASTY Right 12/04/2017   Procedure: RIGHT TOTAL KNEE ARTHROPLASTY;  Surgeon: Latanya Maudlin, MD;  Location: [facility];  Service: Orthopedics;  Laterality: Right;    ROS- all systems are reviewed and negatives except as per HPI above  Current Outpatient Medications  Medication Sig Dispense Refill   acetaminophen (TYLENOL) 500 MG tablet Take 2 tablets (1,000 mg total) by mouth every 8 (eight) hours. 30 tablet 0   albuterol (VENTOLIN HFA) 108 (90 Base) MCG/ACT inhaler Inhale 1-2 puffs into the lungs every 6 (six) hours as needed for wheezing or shortness of breath. 1 each 0   cetirizine (ZYRTEC) 10 MG tablet Take 10 mg by mouth daily.      Cholecalciferol 25 MCG (1000 UT) tablet Take 1 tablet  by mouth daily.     esomeprazole (NEXIUM) 40 MG capsule Take 40 mg by mouth daily.      estradiol (CLIMARA - DOSED IN MG/24 HR) 0.0375 mg/24hr patch Place 1 patch onto the skin 2 (two) times a week.      LOTEMAX 0.5 % ophthalmic suspension Place 1 drop into the right eye every Wednesday.   11   meclizine (ANTIVERT) 25 MG tablet Take 1 tablet by mouth daily as needed for dizziness.     metoprolol succinate (TOPROL-XL) 25 MG 24 hr tablet TAKE 1 TABLET(25 MG) BY MOUTH DAILY 90 tablet 3   ondansetron (ZOFRAN) 4 MG tablet Take 1 tablet (4 mg total) by mouth every 8 (eight) hours as needed for nausea or vomiting.  20 tablet 0   prednisoLONE acetate (PRED FORTE) 1 % ophthalmic suspension Place 1 drop into both eyes once a week.     sodium chloride (MURO 128) 5 % ophthalmic solution Place 1 drop into both eyes as needed for eye irritation.     No current facility-administered medications for this visit.    Physical Exam: There were no vitals filed for this visit.  GEN- The patient is well appearing, alert and oriented x 3 today.   Head- normocephalic, atraumatic Eyes-  Sclera clear, conjunctiva pink Ears- hearing intact Oropharynx- clear Lungs- Clear to ausculation bilaterally, normal work of breathing Heart- Regular rate and rhythm, no murmurs, rubs or gallops, PMI not laterally displaced GI- soft, NT, ND, + BS Extremities- no clubbing, cyanosis, or edema  Wt Readings from Last 3 Encounters:  12/29/20 179 lb (81.2 kg)  06/11/19 179 lb (81.2 kg)  06/04/19 179 lb (81.2 kg)    EKG tracing ordered today is personally reviewed and shows sinus rhythm  We looked at multiple apple watch tracings with only sinus rhythm and sinus tach (110 bpm) observed.  No arrhythmias   Assessment and Plan:  Palpitations Previously due to PACs Likely worsened recently by steroids for back/ hip issues and inactivity No medicine changes today Zio monitor echo  Regular exercise advised  Return in 1 year if above studies are normal  Thompson Grayer MD, [facility] 06/12/2021 11:01 AM

## 2021-06-12 NOTE — Progress Notes (Unsigned)
Patient enrolled for  [employer] to mail a 14 day ZIO XT long term holter monitor to the address on file.

## 2021-06-14 ENCOUNTER — Other Ambulatory Visit: Payer: Self-pay

## 2021-06-14 ENCOUNTER — Ambulatory Visit (HOSPITAL_COMMUNITY): Payer: Medicare PPO | Attending: Internal Medicine

## 2021-06-14 DIAGNOSIS — I517 Cardiomegaly: Secondary | ICD-10-CM | POA: Diagnosis not present

## 2021-06-14 DIAGNOSIS — R0789 Other chest pain: Secondary | ICD-10-CM | POA: Diagnosis not present

## 2021-06-14 DIAGNOSIS — R002 Palpitations: Secondary | ICD-10-CM | POA: Diagnosis not present

## 2021-06-14 DIAGNOSIS — I491 Atrial premature depolarization: Secondary | ICD-10-CM | POA: Diagnosis not present

## 2021-06-14 DIAGNOSIS — I340 Nonrheumatic mitral (valve) insufficiency: Secondary | ICD-10-CM | POA: Diagnosis not present

## 2021-06-14 LAB — ECHOCARDIOGRAM COMPLETE
Area-P 1/2: 3.68 cm2
S' Lateral: 2.3 cm

## 2021-06-16 DIAGNOSIS — I491 Atrial premature depolarization: Secondary | ICD-10-CM

## 2021-06-27 ENCOUNTER — Other Ambulatory Visit: Payer: Self-pay | Admitting: Internal Medicine

## 2021-06-27 MED ORDER — METOPROLOL SUCCINATE ER 25 MG PO TB24: ORAL_TABLET | ORAL | 3 refills | Status: DC

## 2021-07-07 DIAGNOSIS — I491 Atrial premature depolarization: Secondary | ICD-10-CM | POA: Diagnosis not present

## 2021-09-22 DIAGNOSIS — M5416 Radiculopathy, lumbar region: Secondary | ICD-10-CM | POA: Diagnosis not present

## 2021-09-22 DIAGNOSIS — Z6828 Body mass index (BMI) 28.0-28.9, adult: Secondary | ICD-10-CM | POA: Diagnosis not present

## 2021-09-22 DIAGNOSIS — I10 Essential (primary) hypertension: Secondary | ICD-10-CM | POA: Diagnosis not present

## 2021-09-22 IMAGING — MR MR CERVICAL SPINE W/O CM
4 of 5 series · 30 of 48 positions shown · non-contrast
Comparison: 09/04/2014.  11/05/2014.

CLINICAL DATA: Neck pain developing over the last 3 months with
movement. Previous fusion surgery.

EXAM:
MRI CERVICAL SPINE WITHOUT CONTRAST
TECHNIQUE: Multiplanar, multisequence MR imaging of the cervical spine was
performed. No intravenous contrast was administered.

[Series 3: T2 · sagittal · 3.0mm · 0.66mm/px · 8 of 15 slices shown (1 of 2)]
[im 1/15]
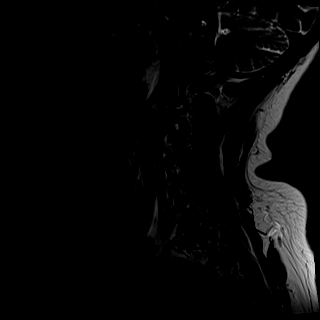
[im 3/15]
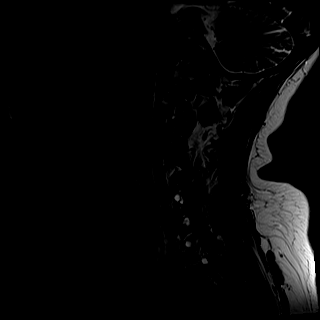
[im 5/15]
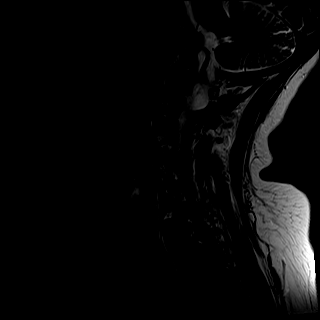
[im 7/15]
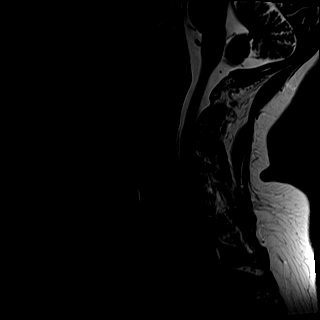
[im 9/15]
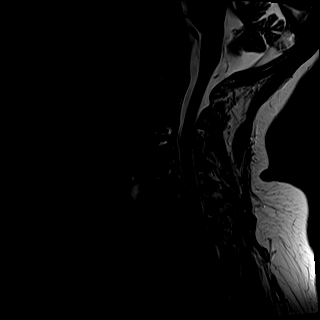
[im 11/15]
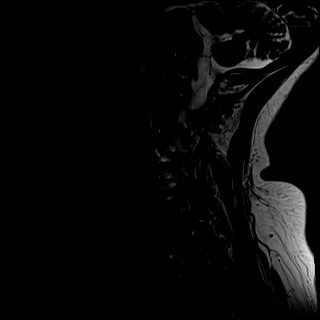
[im 13/15]
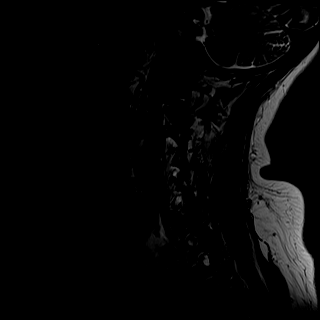
[im 15/15]
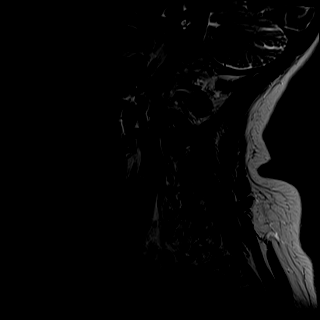

[Series 4: STIR · sagittal · 3.0mm · 0.41mm/px · 6 of 15 slices shown]
[im 1/15]
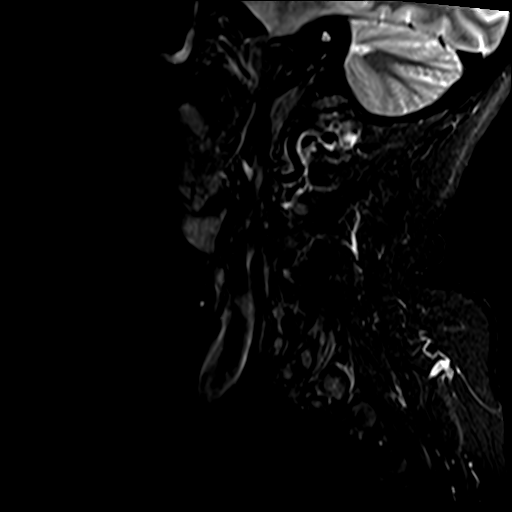
[im 3/15]
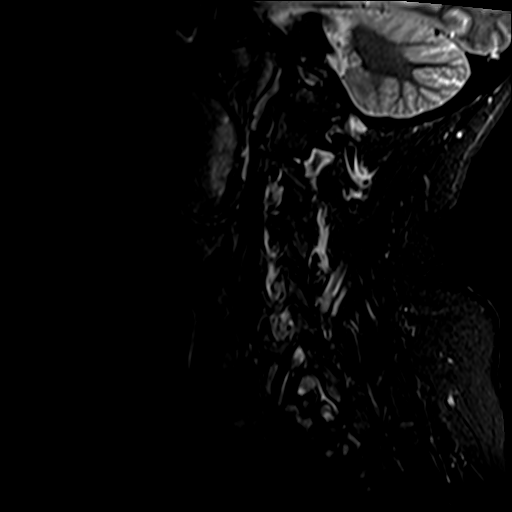
[im 5/15]
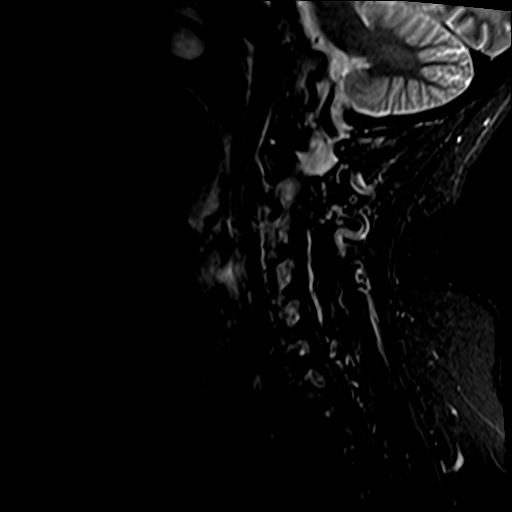
[im 8/15]
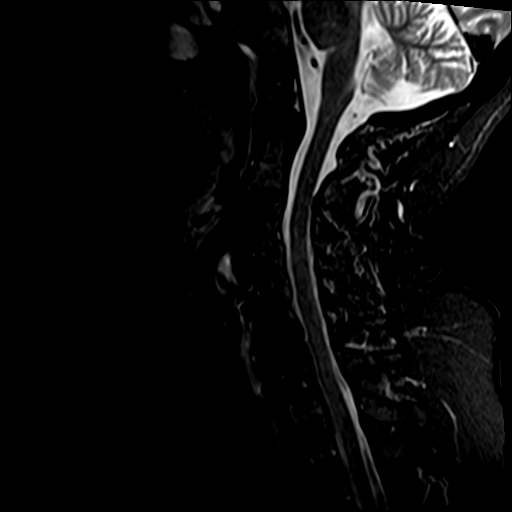
[im 10/15]
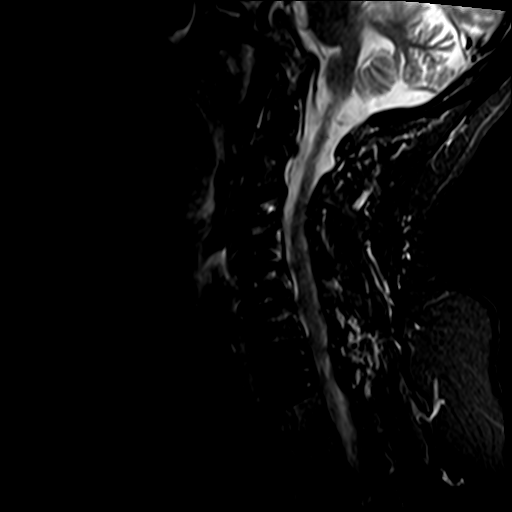
[im 12/15]
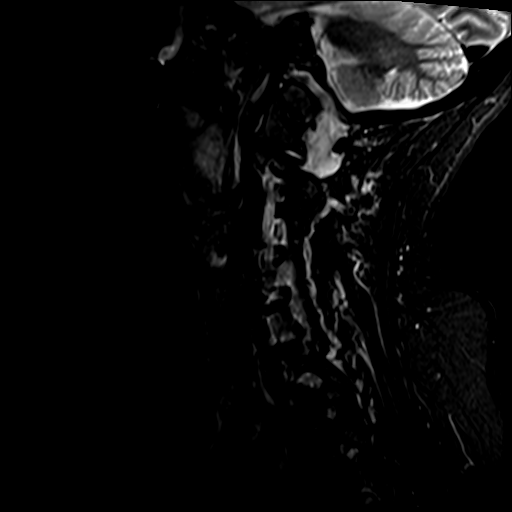

[Series 5: T1 · sagittal · 3.0mm · 0.41mm/px · 7 of 15 slices shown]
[im 1/15]
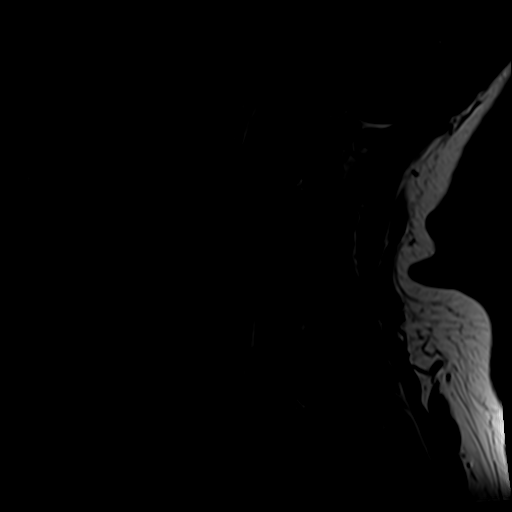
[im 3/15]
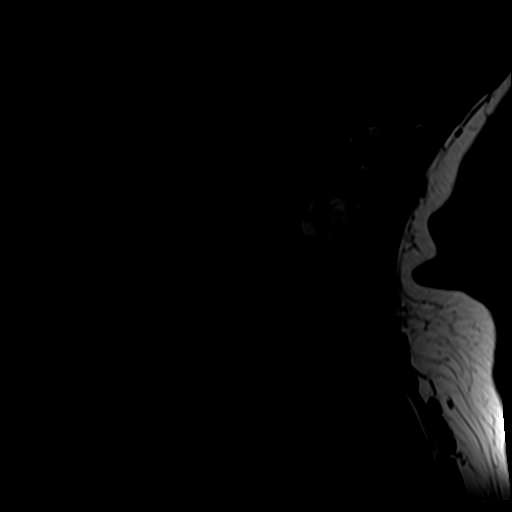
[im 5/15]
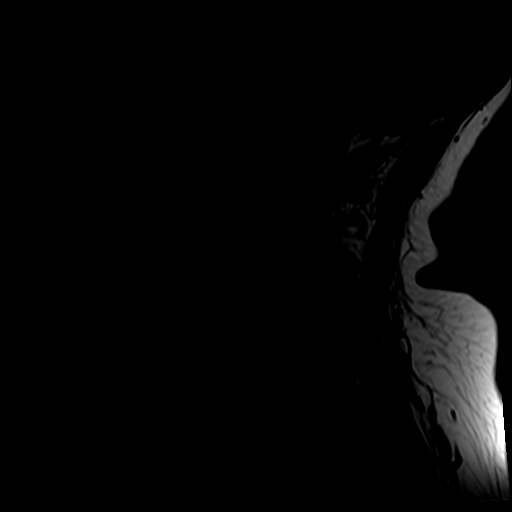
[im 8/15]
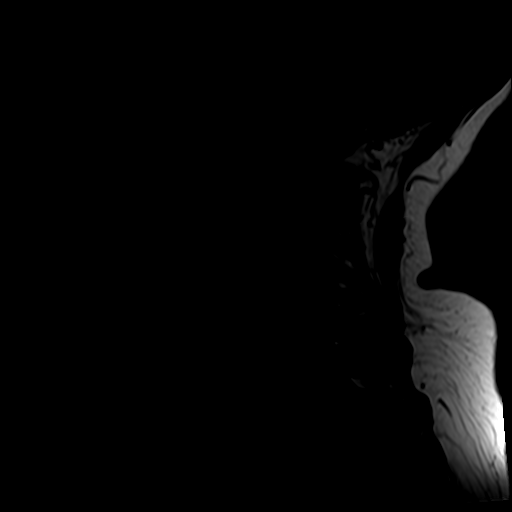
[im 10/15]
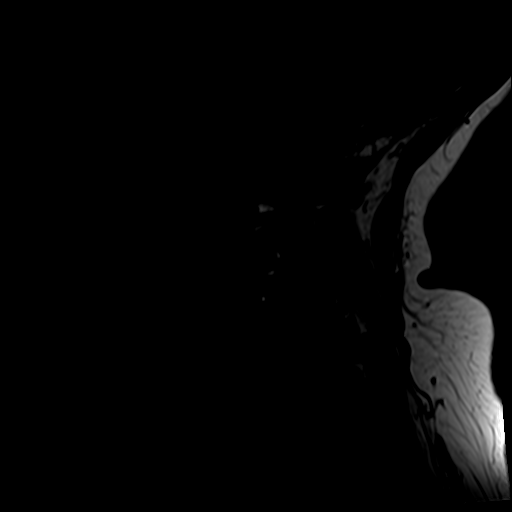
[im 12/15]
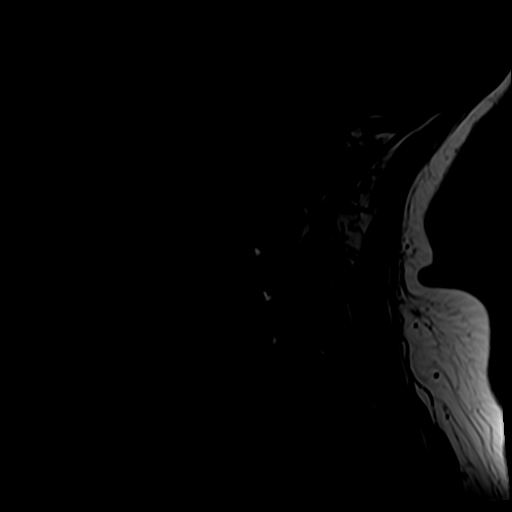
[im 15/15]
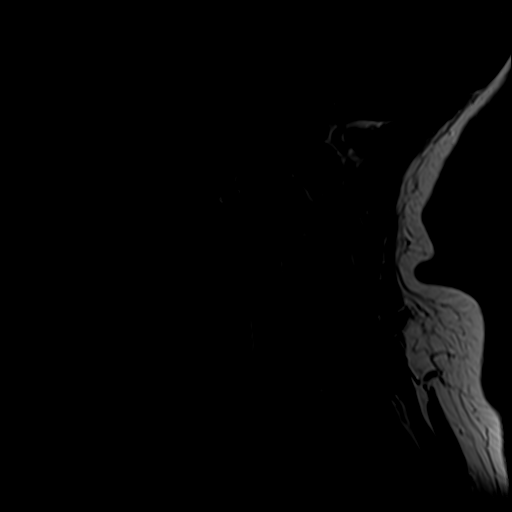

[Series 7: T2 · axial · 3.0mm · 0.70mm/px · z∈[-43,+52]mm · 9 of 28 slices shown (2 of 2)]
[im 1/28]
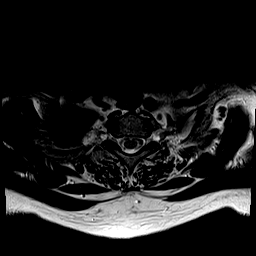
[im 5/28]
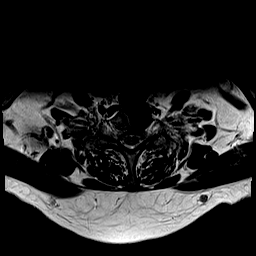
[im 10/28]
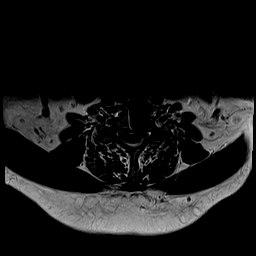
[im 12/28]
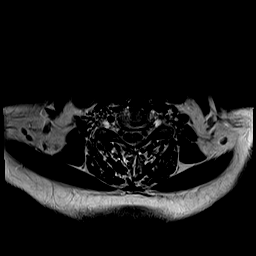
[im 14/28]
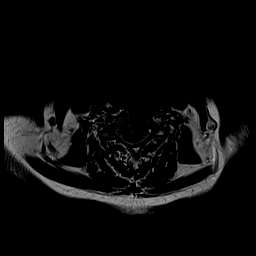
[im 16/28]
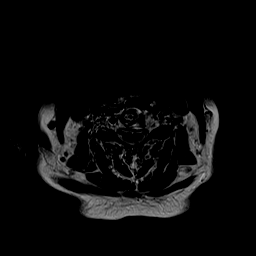
[im 19/28]
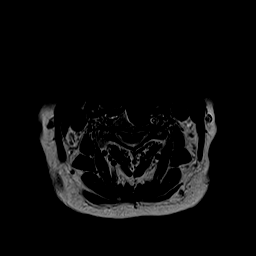
[im 23/28]
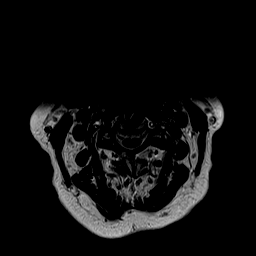
[im 28/28]
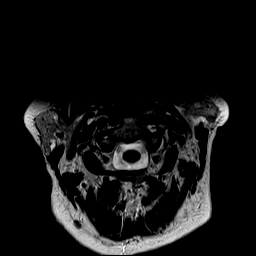

[30 of 48 positions shown; findings below may reference images not displayed]

FINDINGS: Alignment: Normal

Vertebrae: Distant ACDF C3 through C7 appears solid without
complicating feature.

Cord: No cord compression or primary cord lesion. No evidence of
myelopathy.

Posterior Fossa, vertebral arteries, paraspinal tissues: Negative

Disc levels:

Foramen magnum is widely patent. Ordinary mild osteoarthritis at the
C1-2 articulation which could possibly contribute to craniocervical
pain syndromes. No encroachment upon the neural structures.

C2-3: Small endplate osteophytes and minimal bulging of the disc.
Facet osteoarthritis on the left which could be painful. Mild left
foraminal narrowing.

No significant canal or foraminal narrowing noted from C3 through
C7.

C7-T1: No disc pathology. No evidence of facet arthropathy. Probable
facet fusion. No canal or foraminal stenosis.
IMPRESSION: Distant fusion from C3 through C7 has a good appearance with
apparent solid union and wide patency of the canal and foramina.

C1-2: Degenerative arthritis, more on the left than the right, which
could contribute to craniocervical pain syndromes.

C2-3: Left-sided facet osteoarthritis which could be a cause of neck
pain. Some foraminal narrowing on the left which could conceivably
affect the left C3 nerve.

## 2021-09-25 DIAGNOSIS — M25552 Pain in left hip: Secondary | ICD-10-CM | POA: Diagnosis not present

## 2021-09-26 DIAGNOSIS — Z1231 Encounter for screening mammogram for malignant neoplasm of breast: Secondary | ICD-10-CM | POA: Diagnosis not present

## 2021-10-02 DIAGNOSIS — M5116 Intervertebral disc disorders with radiculopathy, lumbar region: Secondary | ICD-10-CM | POA: Diagnosis not present

## 2021-10-02 DIAGNOSIS — M5416 Radiculopathy, lumbar region: Secondary | ICD-10-CM | POA: Diagnosis not present

## 2021-11-02 DIAGNOSIS — M25552 Pain in left hip: Secondary | ICD-10-CM | POA: Diagnosis not present

## 2021-11-16 DIAGNOSIS — K219 Gastro-esophageal reflux disease without esophagitis: Secondary | ICD-10-CM | POA: Diagnosis not present

## 2021-11-16 DIAGNOSIS — Z1211 Encounter for screening for malignant neoplasm of colon: Secondary | ICD-10-CM | POA: Diagnosis not present

## 2021-11-16 DIAGNOSIS — R2689 Other abnormalities of gait and mobility: Secondary | ICD-10-CM | POA: Diagnosis not present

## 2021-11-16 DIAGNOSIS — R251 Tremor, unspecified: Secondary | ICD-10-CM | POA: Diagnosis not present

## 2021-11-18 DIAGNOSIS — M25552 Pain in left hip: Secondary | ICD-10-CM | POA: Diagnosis not present

## 2021-11-22 ENCOUNTER — Encounter: Payer: Self-pay | Admitting: Physical Therapy

## 2021-11-22 ENCOUNTER — Ambulatory Visit: Payer: Medicare PPO | Attending: Family Medicine | Admitting: Physical Therapy

## 2021-11-22 ENCOUNTER — Other Ambulatory Visit: Payer: Self-pay

## 2021-11-22 DIAGNOSIS — M6281 Muscle weakness (generalized): Secondary | ICD-10-CM | POA: Diagnosis not present

## 2021-11-22 DIAGNOSIS — R2681 Unsteadiness on feet: Secondary | ICD-10-CM | POA: Diagnosis not present

## 2021-11-22 DIAGNOSIS — R29818 Other symptoms and signs involving the nervous system: Secondary | ICD-10-CM | POA: Diagnosis not present

## 2021-11-22 DIAGNOSIS — R2689 Other abnormalities of gait and mobility: Secondary | ICD-10-CM | POA: Insufficient documentation

## 2021-11-22 NOTE — Therapy (Addendum)
[facility] [address] [place], [place], [address] Phone: [phone]   Fax:  [phone]  Physical Therapy Evaluation  Patient Details  Name: [name] Stone MRN: [id number] Date of Birth: 05/02/1951 Referring Provider (PT): Lawerance Cruel, MD   Encounter Date: 11/22/2021   PT End of Session - 11/22/21 1314     Visit Number 1    Number of Visits 13    Date for PT Re-Evaluation 02/20/22    Authorization Type [employer] - Medicare    PT Start Time 1232    PT Stop Time 1315    PT Time Calculation (min) 43 min    Equipment Utilized During Treatment Gait belt    Activity Tolerance Patient tolerated treatment well    Behavior During Therapy [facility] for tasks assessed/performed             Past Medical History:  Diagnosis Date   Ankle fracture, left 2005   Arthritis    Cancer ([place])    basal cell ca removed from arm   Complaints of memory disturbance    evaluated by Dr  Marijean Bravo   Dysrhythmia    Has PACs   Esophageal stricture    Esophagitis    Family history of adverse reaction to anesthesia    MOTHER ALWAYS HAS N/V WITH SURGERY    GERD (gastroesophageal reflux disease)    History of environmental allergies    GETS HIVES OCCASIONALLY WHEN NOT USING JUICE PLUS PILLS    Pneumonia    years ago   Premature atrial contractions    followed by DR Wynonia Lawman     Past Surgical History:  Procedure Laterality Date   [place] DECOMP/DISCECTOMY FUSION  12/11/2011   Procedure: ANTERIOR CERVICAL DECOMPRESSION/DISCECTOMY FUSION 2 LEVELS;  Surgeon: Earleen Newport;  Location: [facility];  Service: Neurosurgery;  Laterality: N/A;  C5-6 C6-7 Anterior cervical decompression/diskectomy fusion   ANTERIOR FUSION CERVICAL SPINE  2000   COLONOSCOPY     every 5 years for FH colon polyps   CORNEAL TRANSPLANT     ESOPHAGEAL MANOMETRY N/A 05/25/2016   Procedure: ESOPHAGEAL MANOMETRY (EM);   Surgeon: Manus Gunning, MD;  Location: [facility];  Service: Gastroenterology;  Laterality: N/A;   EYE SURGERY     x 5 right eye   INCONTINENCE SURGERY     KNEE ARTHROSCOPY Right 2015   KNEE ARTHROSCOPY W/ LATERAL RELEASE Right    KNEE ARTHROSCOPY W/ MENISCAL REPAIR Left    KNEE CLOSED REDUCTION Right 02/10/2018   Procedure: CLOSED MANIPULATION RIGHT KNEE;  Surgeon: Latanya Maudlin, MD;  Location: [facility];  Service: Orthopedics;  Laterality: Right;   NISSEN FUNDOPLICATION  69/62/9528   SCAR DEBRIDEMENT OF TOTAL KNEE Right 06/11/2019   Procedure: Irrigation and debridement excision of scar right total knee arthroplasty, with  poly revision;  Surgeon: Paralee Cancel, MD;  Location: [facility];  Service: Orthopedics;  Laterality: Right;  90 mins   TOTAL KNEE ARTHROPLASTY Right 12/04/2017   Procedure: RIGHT TOTAL KNEE ARTHROPLASTY;  Surgeon: Latanya Maudlin, MD;  Location: [facility];  Service: Orthopedics;  Laterality: Right;    There were no vitals filed for this visit.    Subjective Assessment - 11/22/21 1236     Subjective Pt reports noticing new incr tremors in her hands in the past month - affects pt's handwriting and notices she has dropped more things. Noticing worsening balance issues in the past month - having  hold on to things more often for balance and loses balance more with turning or stepping over an object. Has problems with her L hip (since last June) and thinks this could be an issue with stepping over obstacles,  prior to this was walking 12-15 miles a week. Reports her PCP does not think she has PD. Pt's neurolgoist is Dr. Brett Fairy, last saw her in 2017 for memory issues. No falls.    Pertinent History PMH: L ankle fx 2005, R TKA, memory problems, hx of cervical and lumbar surgeries.    Limitations Walking    Patient Stated Goals check how balance is doing.    Currently in Pain? No/denies                [facility] PT Assessment - 11/22/21 1243       Assessment   Medical  Diagnosis Gait abnormality/imbalance    Referring Provider (PT) Lawerance Cruel, MD    Onset Date/Surgical Date 11/20/21   date of referral   Hand Dominance Right    Prior Therapy previous PT for orthopedic issues      Balance Screen   Has the patient fallen in the past 6 months No    Has the patient had a decrease in activity level because of a fear of falling?  No   "just more cautious"   Is the patient reluctant to leave their home because of a fear of falling?  No      Home Environment   Living Environment Private residence    Living Arrangements Alone    Type of [place] to enter    Entrance Stairs-Number of Steps 2    Entrance Stairs-Rails None   holds onto the door.   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    Leisure Walking, going to ball games, hanging out with her great nieces, playing golf.      Cognition   Overall Cognitive Status --   hx of memory issues     Sensation   Light Touch Appears Intact    Additional Comments with BUE/BLE      Posture/Postural Control   Posture/Postural Control Postural limitations    Postural Limitations Forward head;Rounded Shoulders      ROM / Strength   AROM / PROM / Strength Strength      Strength   Overall Strength Comments decr grip strength RUE>LUE    Strength Assessment Site Knee;Ankle;Hip    Right/Left Hip Left;Right    Right Hip Flexion 4-/5    Left Hip Flexion 4/5    Right/Left Knee Left;Right    Right Knee Flexion 4/5    Right Knee Extension 4-/5    Left Knee Flexion 4+/5    Left Knee Extension 4+/5     Right/Left Ankle Right;Left    Right Ankle Dorsiflexion 5/5    Left Ankle Dorsiflexion 5/5      Transfers   Transfers Sit to Stand;Stand to Sit    Sit to Stand 5: Supervision  Five time sit to stand comments  18.13 without UE support, pt reporting fatigue after 3rd rep    Stand to Sit 5: Supervision      Ambulation/Gait   Ambulation/Gait Yes    Ambulation/Gait Assistance 5: Supervision    Assistive device None    Gait Pattern Step-through pattern;Decreased stance time - left;Lateral hip instability    Ambulation Surface Level;Indoor    Gait velocity 8.68 seconds = 3.77 ft/sec      High Level Balance   High Level Balance Comments SLS RLE: 20 seconds, LLE: 13 seconds - incr postural sway. Push and release test: 2 steps in anterior/posterior/lateral directions. mCTSIB: conditions 1-4=30 seconds. mild postural sway condition 2, mod condition 4      Functional Gait  Assessment   Gait assessed  Yes    Gait Level Surface Walks 20 ft in less than 5.5 sec, no assistive devices, good speed, no evidence for imbalance, normal gait pattern, deviates no more than 6 in outside of the 12 in walkway width.    Change in Gait Speed Able to smoothly change walking speed without loss of balance or gait deviation. Deviate no more than 6 in outside of the 12 in walkway width.    Gait with Horizontal Head Turns Performs head turns smoothly with slight change in gait velocity (eg, minor disruption to smooth gait path), deviates 6-10 in outside 12 in walkway width, or uses an assistive device.    Gait with Vertical Head Turns Performs head turns with no change in gait. Deviates no more than 6 in outside 12 in walkway width.    Gait and Pivot Turn Pivot turns safely in greater than 3 sec and stops with no loss of balance, or pivot turns safely within 3 sec and stops with mild imbalance, requires small steps to catch balance.    Step Over Obstacle Is able to step over one shoe box (4.5 in total height) but must  slow down and adjust steps to clear box safely. May require verbal cueing.    Gait with Narrow Base of Support Ambulates less than 4 steps heel to toe or cannot perform without assistance.    Gait with Eyes Closed Walks 20 ft, uses assistive device, slower speed, mild gait deviations, deviates 6-10 in outside 12 in walkway width. Ambulates 20 ft in less than 9 sec but greater than 7 sec.   8.19   Ambulating Backwards Walks 20 ft, uses assistive device, slower speed, mild gait deviations, deviates 6-10 in outside 12 in walkway width.   13.43   Steps Alternating feet, no rail.    Total Score 21    FGA comment: moderate fall risk                        Objective measurements completed on examination: See above findings.                PT Education - 11/22/21 1404     Education Details Clinical findings, POC, make an appt with her neurologist due to new imbalance/tremors that have worsened in the past month.    Person(s) Educated Patient    Methods Explanation    Comprehension Verbalized understanding             PT Short Term Goals - 11/26/21 2057       PT SHORT TERM GOAL #1   Title Pt will be independent with initial HEP in order to build upon functional  gains made in therapy. ALL STGS DUE 12/17/21    Time 3    Period Weeks    Status New    Target Date 12/17/21      PT SHORT TERM GOAL #2   Title Pt will decr 5x sit <> stand time to 16 seconds or less without UE support in order to demo improved functional strength/decr fall risk.    Baseline 18.13 seconds    Time 3    Period Weeks    Status New      PT SHORT TERM GOAL #3   Title Pt will improve FGA score to at least a 23/30 in order to demo decr fall risk.    Baseline 21/30    Time 3    Period Weeks    Status New             PT Long Term Goals - 11/26/21 2100       PT LONG TERM GOAL #1   Title Pt will be independent with final HEP in order to build upon functional gains made in  therapy.  ALL LTGS DUE 01/07/22    Time 6    Period Weeks    Status New    Target Date 01/07/22      PT LONG TERM GOAL #2   Title Pt will improve FGA score to at least a 26/30 in order to demo decr fall risk.    Baseline 21/30    Time 6    Period Weeks    Status New      PT LONG TERM GOAL #3   Title Pt will decr 5x sit <> stand time to 14 seconds or less without UE support in order to demo improved functional strength/decr fall risk.    Baseline 18.13 seconds    Time 6    Period Weeks    Status New      PT LONG TERM GOAL #4   Title Pt will perform anterior and posterior push and release test in 1 robust step in order to demo improved stepping strategies for balance.    Baseline 2 steps.    Time 6    Period Weeks    Status New                11/26/21 2051  Plan  Clinical Impression Statement Patient is a 70 year old female referred to Neuro OPPT  for gait abnormalities/balance impairments that have worsened in approx. the past month. Pt's PMH is significant for: L ankle fx 2005, R TKA, memory problems, hx of cervical and lumbar surgeries. The following deficits were present during the exam: decr strength, impaired balance, gait abnormalities, postural impairments. Based on 5x sit <> stand and FGA, pt is at a moderate fall risk. Pt also with difficulty with vestibular input for balance and SLS bilat. Pt would benefit from skilled PT to address these impairments and functional limitations to maximize functional mobility independence and decr fall risk.  Personal Factors and Comorbidities Comorbidity 3+;Past/Current Experience;Time since onset of injury/illness/exacerbation  Comorbidities L ankle fx 2005, R TKA, memory problems, hx of cervical and lumbar surgeries.  Examination-Activity Limitations Locomotion Level  Examination-Participation Restrictions Community Activity  Pt will benefit from skilled therapeutic intervention in order to improve on the following deficits Abnormal  gait;Decreased balance;Decreased activity tolerance;Decreased strength;Difficulty walking;Postural dysfunction;Decreased coordination  Stability/Clinical Decision Making Evolving/Moderate complexity  Clinical Decision Making Moderate  Rehab Potential Good  PT Frequency 2x / week  PT Duration  12 weeks  PT Treatment/Interventions ADLs/Self Care Home Management;Aquatic Therapy;Gait training;Stair training;Functional mobility training;Therapeutic activities;Neuromuscular re-education;Balance training;Therapeutic exercise;Patient/family education;Energy conservation;Vestibular  PT Next Visit Plan initial HEP for balance (tandem, SLS, vestibular input for balance), general strength. SciFit/Nustep for endurance.  Consulted and Agree with Plan of Care Patient            Patient will benefit from skilled therapeutic intervention in order to improve the following deficits and impairments:     Visit Diagnosis: Unsteadiness on feet  Muscle weakness (generalized)  Other abnormalities of gait and mobility  Other symptoms and signs involving the nervous system     Problem List Patient Active Problem List   Diagnosis Date Noted   Arthrofibrosis of knee joint, right 06/11/2019   Hx of total knee arthroplasty, right 12/04/2017   Atypical chest pain    Mild cognitive impairment with memory loss 04/29/2013   Cervical spondylosis with radiculopathy 12/11/2011   FECAL OCCULT BLOOD 02/18/2009   ESOPHAGEAL STRICTURE 02/16/2009   GERD 02/16/2009   MENOPAUSE, SURGICAL 02/24/2008   SKIN CANCER, HX OF 02/24/2008   Personal history of other diseases of digestive system 02/24/2008    Arliss Journey, PT, DPT  11/22/2021, 2:05 PM  [facility] [address] [place], [place], [address] Phone: [phone]   Fax:  [phone]  Name: [name] Stone MRN: [id number] Date of Birth: 1951-02-16

## 2021-11-26 NOTE — Addendum Note (Signed)
Addended by: Arliss Journey on: 11/26/2021 09:05 PM   Modules accepted: Orders

## 2021-11-27 ENCOUNTER — Encounter: Payer: Self-pay | Admitting: Physical Therapy

## 2021-11-27 ENCOUNTER — Other Ambulatory Visit: Payer: Self-pay

## 2021-11-27 ENCOUNTER — Ambulatory Visit: Payer: Medicare PPO | Admitting: Physical Therapy

## 2021-11-27 DIAGNOSIS — R2689 Other abnormalities of gait and mobility: Secondary | ICD-10-CM | POA: Diagnosis not present

## 2021-11-27 DIAGNOSIS — R2681 Unsteadiness on feet: Secondary | ICD-10-CM | POA: Diagnosis not present

## 2021-11-27 DIAGNOSIS — R29818 Other symptoms and signs involving the nervous system: Secondary | ICD-10-CM

## 2021-11-27 DIAGNOSIS — M6281 Muscle weakness (generalized): Secondary | ICD-10-CM

## 2021-11-27 NOTE — Patient Instructions (Signed)
Access Code: [email or web address] URL: [email or web address] Date: 11/27/2021 Prepared by: Janann August  Exercises Standing Marching - 2 x daily - 5 x weekly - 3 sets Tandem Walking with Counter Support - 2 x daily - 5 x weekly - 3 sets Sit to Stand - 2 x daily - 5 x weekly - 2 sets - 5 reps Romberg Stance Eyes Closed on Foam Pad - 1-2 x daily - 5 x weekly - 3 sets - 30 hold Wide Stance with Eyes Closed on Foam Pad - 1-2 x daily - 5 x weekly - 2 sets - 5 reps

## 2021-11-27 NOTE — Therapy (Signed)
[facility] [address] [place], [place], [address] Phone: [phone]   Fax:  [phone]  Physical Therapy Treatment  Patient Details  Name: [name] Stone MRN: [id number] Date of Birth: December 04, 1951 Referring Provider (PT): Lawerance Cruel, MD   Encounter Date: 11/27/2021   PT End of Session - 11/27/21 0719     Visit Number 2    Number of Visits 13    Date for PT Re-Evaluation 02/20/22    Authorization Type [employer] - Medicare    PT Start Time 0718    PT Stop Time 0758    PT Time Calculation (min) 40 min    Equipment Utilized During Treatment Gait belt    Activity Tolerance Patient tolerated treatment well    Behavior During Therapy [facility] for tasks assessed/performed             Past Medical History:  Diagnosis Date   Ankle fracture, left 2005   Arthritis    Cancer ([place])    basal cell ca removed from arm   Complaints of memory disturbance    evaluated by Dr  Marijean Bravo   Dysrhythmia    Has PACs   Esophageal stricture    Esophagitis    Family history of adverse reaction to anesthesia    MOTHER ALWAYS HAS N/V WITH SURGERY    GERD (gastroesophageal reflux disease)    History of environmental allergies    GETS HIVES OCCASIONALLY WHEN NOT USING JUICE PLUS PILLS    Pneumonia    years ago   Premature atrial contractions    followed by DR Wynonia Lawman     Past Surgical History:  Procedure Laterality Date   [place] DECOMP/DISCECTOMY FUSION  12/11/2011   Procedure: ANTERIOR CERVICAL DECOMPRESSION/DISCECTOMY FUSION 2 LEVELS;  Surgeon: Earleen Newport;  Location: [facility];  Service: Neurosurgery;  Laterality: N/A;  C5-6 C6-7 Anterior cervical decompression/diskectomy fusion   ANTERIOR FUSION CERVICAL SPINE  2000   COLONOSCOPY     every 5 years for FH colon polyps   CORNEAL TRANSPLANT     ESOPHAGEAL MANOMETRY N/A 05/25/2016   Procedure: ESOPHAGEAL MANOMETRY (EM);  Surgeon:  Manus Gunning, MD;  [facility]: [facility];  Service: Gastroenterology;  Laterality: N/A;   EYE SURGERY     x 5 right eye   INCONTINENCE SURGERY     KNEE ARTHROSCOPY Right 2015   KNEE ARTHROSCOPY W/ LATERAL RELEASE Right    KNEE ARTHROSCOPY W/ MENISCAL REPAIR Left    KNEE CLOSED REDUCTION Right 02/10/2018   Procedure: CLOSED MANIPULATION RIGHT KNEE;  Surgeon: Latanya Maudlin, MD;  Location: [facility];  Service: Orthopedics;  Laterality: Right;   NISSEN FUNDOPLICATION  50/93/2671   SCAR DEBRIDEMENT OF TOTAL KNEE Right 06/11/2019   Procedure: Irrigation and debridement excision of scar right total knee arthroplasty, with  poly revision;  Surgeon: Paralee Cancel, MD;  [facility]: [facility];  Service: Orthopedics;  Laterality: Right;  90 mins   TOTAL KNEE ARTHROPLASTY Right 12/04/2017   Procedure: RIGHT TOTAL KNEE ARTHROPLASTY;  Surgeon: Latanya Maudlin, MD;  [facility]: [facility];  Service: Orthopedics;  Laterality: Right;    There were no vitals filed for this visit.   Subjective Assessment - 11/27/21 0720     Subjective Nothing new since she was last here.    Pertinent History PMH: L ankle fx 2005, R TKA, memory problems, hx of cervical and lumbar surgeries.    Limitations Walking  Patient Stated Goals check how balance is doing.    Currently in Pain? No/denies                               [facility] PT Treatment/Exercise - 11/27/21 0754       Exercises   Exercises Knee/Hip      Knee/Hip Exercises: Aerobic   Stepper Seated SciFit for strengthening, endurance at gear 1.6 for 6 minutes with BLE/BUE                 Balance Exercises - 11/27/21 0736       Balance Exercises: Standing   SLS with Vectors Foam/compliant surface;Limitations    SLS with Vectors Limitations On air ex: alternating forward cone taps x10 reps each leg, cross body taps x10 reps each leg, cues for glute activation for SLS.    Rockerboard Anterior/posterior;Limitations     Rockerboard Limitations Weight shifting without hands x15 reps, keeping board still x5 reps head turns, x5 head nods. in M/L direction weight shifting x15 reps.            Access Code: [email or web address] URL: [email or web address] Date: 11/27/2021 Prepared by: Janann August  Initiated HEP - see MedBridge for more details.   Exercises Standing Marching - 2 x daily - 5 x weekly - 3 sets -forwards and backwards Tandem Walking with Counter Support - 2 x daily - 5 x weekly - 3 sets Sit to Stand - 2 x daily - 5 x weekly - 2 sets - 5 reps Romberg Stance Eyes Closed on Foam Pad - 1-2 x daily - 5 x weekly - 3 sets - 30 hold Wide Stance with Eyes Closed on Foam Pad - 1-2 x daily - 5 x weekly - 2 sets - 5 reps -2x5 reps head turns,2 x5 reps head nods      PT Education - 11/27/21 0754     Education Details Initial HEP    Person(s) Educated Patient    Methods Explanation;Demonstration;Handout    Comprehension Verbalized understanding;Returned demonstration              PT Short Term Goals - 11/26/21 2057       PT SHORT TERM GOAL #1   Title Pt will be independent with initial HEP in order to build upon functional gains made in therapy. ALL STGS DUE 12/17/21    Time 3    Period Weeks    Status New    Target Date 12/17/21      PT SHORT TERM GOAL #2   Title Pt will decr 5x sit <> stand time to 16 seconds or less without UE support in order to demo improved functional strength/decr fall risk.    Baseline 18.13 seconds    Time 3    Period Weeks    Status New      PT SHORT TERM GOAL #3   Title Pt will improve FGA score to at least a 23/30 in order to demo decr fall risk.    Baseline 21/30    Time 3    Period Weeks    Status New               PT Long Term Goals - 11/26/21 2100       PT LONG TERM GOAL #1   Title Pt will be independent with final HEP in order to build upon functional gains made in therapy.  ALL LTGS DUE  01/07/22    Time 6    Period Weeks     Status New    Target Date 01/07/22      PT LONG TERM GOAL #2   Title Pt will improve FGA score to at least a 26/30 in order to demo decr fall risk.    Baseline 21/30    Time 6    Period Weeks    Status New      PT LONG TERM GOAL #3   Title Pt will decr 5x sit <> stand time to 14 seconds or less without UE support in order to demo improved functional strength/decr fall risk.    Baseline 18.13 seconds    Time 6    Period Weeks    Status New      PT LONG TERM GOAL #4   Title Pt will perform anterior and posterior push and release test in 1 robust step in order to demo improved stepping strategies for balance.    Baseline 2 steps.    Time 6    Period Weeks    Status New                   Plan - 11/27/21 0800     Clinical Impression Statement Today's skilled session focused on initiating HEP for strength, balance with focus on more narrow BOS and vestibular input. Pt tolerated session well. Pt more challenged by rockerboard in M/L direction. Will continue to progress towards LTGs.    Personal Factors and Comorbidities Comorbidity 3+;Past/Current Experience;Time since onset of injury/illness/exacerbation    Comorbidities L ankle fx 2005, R TKA, memory problems, hx of cervical and lumbar surgeries.    Examination-Activity Limitations Locomotion Level    Examination-Participation Restrictions Community Activity    Stability/Clinical Decision Making Evolving/Moderate complexity    Rehab Potential Good    PT Frequency 2x / week    PT Duration 12 weeks    PT Treatment/Interventions ADLs/Self Care Home Management;Aquatic Therapy;Gait training;Stair training;Functional mobility training;Therapeutic activities;Neuromuscular re-education;Balance training;Therapeutic exercise;Patient/family education;Energy conservation;Vestibular    PT Next Visit Plan how is HEP? balance - compliant surfaces, SLS, vestibular input, tandem. functional BLE strength.  SciFit/Nustep for endurance.     Consulted and Agree with Plan of Care Patient             Patient will benefit from skilled therapeutic intervention in order to improve the following deficits and impairments:  Abnormal gait, Decreased balance, Decreased activity tolerance, Decreased strength, Difficulty walking, Postural dysfunction, Decreased coordination  Visit Diagnosis: Unsteadiness on feet  Muscle weakness (generalized)  Other abnormalities of gait and mobility  Other symptoms and signs involving the nervous system     Problem List Patient Active Problem List   Diagnosis Date Noted   Arthrofibrosis of knee joint, right 06/11/2019   Hx of total knee arthroplasty, right 12/04/2017   Atypical chest pain    Mild cognitive impairment with memory loss 04/29/2013   Cervical spondylosis with radiculopathy 12/11/2011   FECAL OCCULT BLOOD 02/18/2009   ESOPHAGEAL STRICTURE 02/16/2009   GERD 02/16/2009   MENOPAUSE, SURGICAL 02/24/2008   SKIN CANCER, HX OF 02/24/2008   Personal history of other diseases of digestive system 02/24/2008    Arliss Journey, PT, DPT  11/27/2021, 8:01 AM  [facility] [address] [place], [place], [address] Phone: [phone]   Fax:  [phone]  Name: [name] Stone MRN: [id number] Date of Birth: 04-May-1951

## 2021-12-01 ENCOUNTER — Ambulatory Visit: Payer: Medicare PPO

## 2021-12-04 ENCOUNTER — Ambulatory Visit: Payer: Medicare PPO | Admitting: Physical Therapy

## 2021-12-08 ENCOUNTER — Other Ambulatory Visit: Payer: Self-pay

## 2021-12-08 ENCOUNTER — Ambulatory Visit: Payer: Medicare PPO | Attending: Family Medicine | Admitting: Physical Therapy

## 2021-12-08 DIAGNOSIS — R42 Dizziness and giddiness: Secondary | ICD-10-CM | POA: Diagnosis not present

## 2021-12-08 DIAGNOSIS — M6281 Muscle weakness (generalized): Secondary | ICD-10-CM | POA: Diagnosis not present

## 2021-12-08 DIAGNOSIS — R2689 Other abnormalities of gait and mobility: Secondary | ICD-10-CM | POA: Insufficient documentation

## 2021-12-08 DIAGNOSIS — R2681 Unsteadiness on feet: Secondary | ICD-10-CM | POA: Diagnosis not present

## 2021-12-08 NOTE — Therapy (Signed)
[facility] [address] [place], [place], [address] Phone: [phone]   Fax:  [phone]  Physical Therapy Treatment-Re-Cert  Patient Details  Name: DNASIA GAUNA MRN: [id number] Date of Birth: 1951-03-14 Referring Provider (PT): Lawerance Cruel, MD   Encounter Date: 12/08/2021   PT End of Session - 12/08/21 0849     Visit Number 3    Number of Visits 13    Date for PT Re-Evaluation 02/20/22    Authorization Type [employer] - Medicare    PT Start Time 0845    PT Stop Time 0930    PT Time Calculation (min) 45 min    Equipment Utilized During Treatment Gait belt    Activity Tolerance Patient tolerated treatment well    Behavior During Therapy [facility] for tasks assessed/performed             Past Medical History:  Diagnosis Date   Ankle fracture, left 2005   Arthritis    Cancer ([place])    basal cell ca removed from arm   Complaints of memory disturbance    evaluated by Dr  Marijean Bravo   Dysrhythmia    Has PACs   Esophageal stricture    Esophagitis    Family history of adverse reaction to anesthesia    MOTHER ALWAYS HAS N/V WITH SURGERY    GERD (gastroesophageal reflux disease)    History of environmental allergies    GETS HIVES OCCASIONALLY WHEN NOT USING JUICE PLUS PILLS    Pneumonia    years ago   Premature atrial contractions    followed by DR Wynonia Lawman     Past Surgical History:  Procedure Laterality Date   [place] DECOMP/DISCECTOMY FUSION  12/11/2011   Procedure: ANTERIOR CERVICAL DECOMPRESSION/DISCECTOMY FUSION 2 LEVELS;  Surgeon: Earleen Newport;  Location: [place] [facility];  Service: Neurosurgery;  Laterality: N/A;  C5-6 C6-7 Anterior cervical decompression/diskectomy fusion   ANTERIOR FUSION CERVICAL SPINE  2000   COLONOSCOPY     every 5 years for FH colon polyps   CORNEAL TRANSPLANT     ESOPHAGEAL MANOMETRY N/A 05/25/2016   Procedure: ESOPHAGEAL MANOMETRY (EM);   Surgeon: Manus Gunning, MD;  [facility]: [facility];  Service: Gastroenterology;  Laterality: N/A;   EYE SURGERY     x 5 right eye   INCONTINENCE SURGERY     KNEE ARTHROSCOPY Right 2015   KNEE ARTHROSCOPY W/ LATERAL RELEASE Right    KNEE ARTHROSCOPY W/ MENISCAL REPAIR Left    KNEE CLOSED REDUCTION Right 02/10/2018   Procedure: CLOSED MANIPULATION RIGHT KNEE;  Surgeon: Latanya Maudlin, MD;  Location: [facility];  Service: Orthopedics;  Laterality: Right;   NISSEN FUNDOPLICATION  33/35/4562   SCAR DEBRIDEMENT OF TOTAL KNEE Right 06/11/2019   Procedure: Irrigation and debridement excision of scar right total knee arthroplasty, with  poly revision;  Surgeon: Paralee Cancel, MD;  Location: [facility];  Service: Orthopedics;  Laterality: Right;  90 mins   TOTAL KNEE ARTHROPLASTY Right 12/04/2017   Procedure: RIGHT TOTAL KNEE ARTHROPLASTY;  Surgeon: Latanya Maudlin, MD;  Location: [facility];  Service: Orthopedics;  Laterality: Right;    There were no vitals filed for this visit.   Subjective Assessment - 12/08/21 0850     Subjective Took meclizine to help prevent an episode of vertigo.    Pertinent History PMH: L ankle fx 2005, R TKA, memory problems, hx of cervical and lumbar surgeries.    Limitations Walking  Patient Stated Goals check how balance is doing.    Currently in Pain? No/denies                     Vestibular Assessment - 12/08/21 0857       Symptom Behavior   Subjective history of current problem Reports having vertigo for years. Will have a couple episodes a year. The increased occurence has been in the past 2 years. Will just come on randomly. Will take a meclizine to help. If its a really bad spell, will have to lay down and rest for a few hours. Can't walk when it is really bad.    Type of Dizziness  Imbalance;Spinning   "can't stand up and walk", borders on feeling nauseous, when laying back will feel spinning until rolling on her side, will also feel nauseous    Frequency of Dizziness approx. every 2-3 months    Duration of Dizziness about a half a day    Symptom Nature Positional;Motion provoked    Aggravating Factors Supine to sit   being in vertical position, trying to walk   Relieving Factors Comments   lay down on L side in fetal position, taking meclizine   Progression of Symptoms Worse   in the past 2 years   History of similar episodes has been going on for about ~20 years      Oculomotor Exam   Oculomotor Alignment --   exophoria bilat   Ocular ROM WFL    Spontaneous Absent    Gaze-induced  Absent    Smooth Pursuits Intact   looking up was more challenging   Saccades Intact;Comment   Reports feeling uncomfortable going to midline > left, and midline > up     Oculomotor Exam-Fixation Suppressed    Left Head Impulse --   pt with difficulty relaxing   Right Head Impulse --   pt with difficulty relaxing     Vestibulo-Ocular Reflex   VOR to Slow Head Movement Normal    VOR Cancellation Normal      Visual Acuity   Static line 8    Dynamic line 4   mild dizziness     Positional Testing   Horizontal Canal Testing Horizontal Canal Right;Horizontal Canal Left      Horizontal Canal Right   Horizontal Canal Right Symptoms Normal   no nystagmus, mild dizziness     Horizontal Canal Left   Horizontal Canal Left Symptoms Normal   no nystagmus, mild dizziness     Positional Sensitivities   Sit to Supine Mild dizziness    Supine to Left Side Mild dizziness    Supine to Right Side Mild dizziness    Supine to Sitting Mild dizziness    Nose to Right Knee No dizziness    Right Knee to Sitting No dizziness    Nose to Left Knee No dizziness    Left Knee to Sitting No dizziness    Head Turning x 5 Mild dizziness   wanted to close her eyes afterwards   Head Nodding x 5 No dizziness    Pivot Right in Standing No dizziness    Pivot Left in Standing No dizziness    Rolling Right Mild dizziness    Rolling Left Mild dizziness                                PT Education - 12/08/21 1001  Education Details Clinical findings of vestibular assessment (will complete further assessment at next session) and what therapy can address. Reasons and purpose of assessing for BPPV/anatomy behind it.    Person(s) Educated Patient    Methods Explanation    Comprehension Verbalized understanding              PT Short Term Goals - 11/26/21 2057       PT SHORT TERM GOAL #1   Title Pt will be independent with initial HEP in order to build upon functional gains made in therapy. ALL STGS DUE 12/17/21    Time 3    Period Weeks    Status New    Target Date 12/17/21      PT SHORT TERM GOAL #2   Title Pt will decr 5x sit <> stand time to 16 seconds or less without UE support in order to demo improved functional strength/decr fall risk.    Baseline 18.13 seconds    Time 3    Period Weeks    Status New      PT SHORT TERM GOAL #3   Title Pt will improve FGA score to at least a 23/30 in order to demo decr fall risk.    Baseline 21/30    Time 3    Period Weeks    Status New               PT Long Term Goals - 12/08/21 1003       PT LONG TERM GOAL #1   Title Pt will be independent with final HEP in order to build upon functional gains made in therapy.  ALL LTGS DUE 01/07/22    Time 6    Period Weeks    Status New    Target Date 01/07/22      PT LONG TERM GOAL #2   Title Pt will improve FGA score to at least a 26/30 in order to demo decr fall risk.    Baseline 21/30    Time 6    Period Weeks    Status New      PT LONG TERM GOAL #3   Title Pt will decr 5x sit <> stand time to 14 seconds or less without UE support in order to demo improved functional strength/decr fall risk.    Baseline 18.13 seconds    Time 6    Period Weeks    Status New      PT LONG TERM GOAL #4   Title Pt will perform anterior and posterior push and release test in 1 robust step in order to demo improved stepping strategies  for balance.    Baseline 2 steps.    Time 6    Period Weeks    Status New      PT LONG TERM GOAL #5   Title Pt will improve DVA to at least line 6 in order to demo improved VOR.    Baseline static: line 8, dyanmic: line 4    Time 6    Period Weeks    Status New                   Plan - 12/08/21 1000     Clinical Impression Statement Pt comes into session today reporting that she has had some incr dizziness episodes recently and has had to take meclizine. During eval, pt reporting feeling more unsteady/off balance and not dizzy. Pt reports that she has a hx of dizziness/motion sensitivity over  the past 20 years, but reports incr frequency of episodes in the past 2 years. Reports that they will come on randomly, or when she is lying onto her back and is then relieved when she curls to the R in the fetal positon and can last up to 1/2 a day. Performed vestibular assessment today with pt demonstrating intact saccades but incr difficulty going to left and up. Unable to assess head impulse test due to pt with difficulty relaxing, but pt's DVA was a 4 line difficulty indicating vestibular hypofunction and difficulties with VOR. Pt with positional sensitivities with head turns, sit > supine, rolling right and left. Negative horizontal canal testing bilat. Due to pt reporting at times feeling nauseous lying backwards, will hold off on asessing A/P canals until next session as pt has a busy day after session today (pt verbalized understanding). Re-cert sent to include vestibular assessment/tx and goal added for DVA.    Personal Factors and Comorbidities Comorbidity 3+;Past/Current Experience;Time since onset of injury/illness/exacerbation    Comorbidities L ankle fx 2005, R TKA, memory problems, hx of cervical and lumbar surgeries.    Examination-Activity Limitations Locomotion Level    Examination-Participation Restrictions Community Activity    Stability/Clinical Decision Making  Evolving/Moderate complexity    Rehab Potential Good    PT Frequency 2x / week    PT Duration 12 weeks    PT Treatment/Interventions ADLs/Self Care Home Management;Aquatic Therapy;Gait training;Stair training;Functional mobility training;Therapeutic activities;Neuromuscular re-education;Balance training;Therapeutic exercise;Patient/family education;Energy conservation;Vestibular;Canalith Repostioning    PT Next Visit Plan finish rest of vestibular assessment. give VOR x1 exercises for home. perform balance master?? balance - compliant surfaces, SLS, vestibular input, tandem. functional BLE strength.  SciFit/Nustep for endurance.    Consulted and Agree with Plan of Care Patient             Patient will benefit from skilled therapeutic intervention in order to improve the following deficits and impairments:  Abnormal gait, Decreased balance, Decreased activity tolerance, Decreased strength, Difficulty walking, Postural dysfunction, Decreased coordination  Visit Diagnosis: Unsteadiness on feet  Muscle weakness (generalized)  Other abnormalities of gait and mobility  Dizziness and giddiness     Problem List Patient Active Problem List   Diagnosis Date Noted   Arthrofibrosis of knee joint, right 06/11/2019   Hx of total knee arthroplasty, right 12/04/2017   Atypical chest pain    Mild cognitive impairment with memory loss 04/29/2013   Cervical spondylosis with radiculopathy 12/11/2011   FECAL OCCULT BLOOD 02/18/2009   ESOPHAGEAL STRICTURE 02/16/2009   GERD 02/16/2009   MENOPAUSE, SURGICAL 02/24/2008   SKIN CANCER, HX OF 02/24/2008   Personal history of other diseases of digestive system 02/24/2008    Arliss Journey, PT, DPT  12/08/2021, 10:09 AM  [facility] [address] [place], [place], [address] Phone: [phone]   Fax:  [phone]  Name: ROSALYNN SERGENT MRN: [id number] Date of Birth: Jan 11, 1951

## 2021-12-11 ENCOUNTER — Encounter: Payer: Self-pay | Admitting: Physical Therapy

## 2021-12-11 ENCOUNTER — Ambulatory Visit: Payer: Medicare PPO | Admitting: Physical Therapy

## 2021-12-11 ENCOUNTER — Other Ambulatory Visit: Payer: Self-pay

## 2021-12-11 DIAGNOSIS — R2681 Unsteadiness on feet: Secondary | ICD-10-CM

## 2021-12-11 DIAGNOSIS — R42 Dizziness and giddiness: Secondary | ICD-10-CM | POA: Diagnosis not present

## 2021-12-11 DIAGNOSIS — R2689 Other abnormalities of gait and mobility: Secondary | ICD-10-CM | POA: Diagnosis not present

## 2021-12-11 DIAGNOSIS — M6281 Muscle weakness (generalized): Secondary | ICD-10-CM | POA: Diagnosis not present

## 2021-12-11 NOTE — Therapy (Addendum)
[facility] [address] [place], [place], [address] Phone: [phone]   Fax:  [phone]  Physical Therapy Treatment  Patient Details  Name: [name] Stone MRN: [id number] Date of Birth: Mar 27, 1951 Referring Provider (PT): Lawerance Cruel, MD   Encounter Date: 12/11/2021   PT End of Session - 12/11/21 0807     Visit Number 4    Number of Visits 13    Date for PT Re-Evaluation 02/20/22    Authorization Type [employer] - Medicare    PT Start Time 0803    PT Stop Time 0845    PT Time Calculation (min) 42 min    Equipment Utilized During Treatment Gait belt    Activity Tolerance Patient tolerated treatment well    Behavior During Therapy [facility] for tasks assessed/performed             Past Medical History:  Diagnosis Date   Ankle fracture, left 2005   Arthritis    Cancer ([place])    basal cell ca removed from arm   Complaints of memory disturbance    evaluated by Dr  Marijean Bravo   Depression    Dysrhythmia    Has PACs   Esophageal stricture    Esophagitis    Family history of adverse reaction to anesthesia    MOTHER ALWAYS HAS N/V WITH SURGERY    GERD (gastroesophageal reflux disease)    History of environmental allergies    GETS HIVES OCCASIONALLY WHEN NOT USING JUICE PLUS PILLS    Pneumonia    years ago   Premature atrial contractions    followed by DR Wynonia Lawman     Past Surgical History:  Procedure Laterality Date   [place] DECOMP/DISCECTOMY FUSION  12/11/2011   Procedure: ANTERIOR CERVICAL DECOMPRESSION/DISCECTOMY FUSION 2 LEVELS;  Surgeon: Earleen Newport;  Location: [place] [facility];  Service: Neurosurgery;  Laterality: N/A;  C5-6 C6-7 Anterior cervical decompression/diskectomy fusion   ANTERIOR FUSION CERVICAL SPINE  2000   COLONOSCOPY     every 5 years for FH colon polyps   CORNEAL TRANSPLANT     ESOPHAGEAL MANOMETRY N/A 05/25/2016   Procedure: ESOPHAGEAL MANOMETRY  (EM);  Surgeon: Manus Gunning, MD;  [facility];  Service: Gastroenterology;  Laterality: N/A;   EYE SURGERY     x 5 right eye   INCONTINENCE SURGERY     KNEE ARTHROSCOPY Right 2015   KNEE ARTHROSCOPY W/ LATERAL RELEASE Right    KNEE ARTHROSCOPY W/ MENISCAL REPAIR Left    KNEE CLOSED REDUCTION Right 02/10/2018   Procedure: CLOSED MANIPULATION RIGHT KNEE;  Surgeon: Latanya Maudlin, MD;  Location: [facility];  Service: Orthopedics;  Laterality: Right;   NISSEN FUNDOPLICATION  69/62/9528   SCAR DEBRIDEMENT OF TOTAL KNEE Right 06/11/2019   Procedure: Irrigation and debridement excision of scar right total knee arthroplasty, with  poly revision;  Surgeon: Paralee Cancel, MD;  Location: [facility];  Service: Orthopedics;  Laterality: Right;  90 mins   TOTAL KNEE ARTHROPLASTY Right 12/04/2017   Procedure: RIGHT TOTAL KNEE ARTHROPLASTY;  Surgeon: Latanya Maudlin, MD;  [facility];  Service: Orthopedics;  Laterality: Right;    There were no vitals filed for this visit.   Subjective Assessment - 12/11/21 0808     Subjective Had to take a couple meclizine over the weekend. Feeling better today.    Pertinent History PMH: L ankle fx 2005, R TKA, memory problems, hx of cervical and lumbar  surgeries.    Limitations Walking    Patient Stated Goals check how balance is doing.    Currently in Pain? No/denies                     Vestibular Assessment - 12/11/21 0814       Positional Testing   Dix-Hallpike Dix-Hallpike Right;Dix-Hallpike Left    Sidelying Test Sidelying Right;Sidelying Left      Dix-Hallpike Right   Dix-Hallpike Right Symptoms Other (comment)   No nystagmus noted, but pt reporting feeling dizziness     Dix-Hallpike Left   Dix-Hallpike Left Symptoms Downbeat Nystagmus;Other (comment)   No rotatory component noted.     Sidelying Right   Sidelying Right Symptoms Other (comment)   no nystagmus noted but pt reports feeling "a heaviness feeling" and dizziness  that lasted approx 15-20 seconds     Sidelying Left   Sidelying Left Symptoms Other (comment)   no nystagmus noted but pt reports feeling "a heaviness feeling" and dizziness that lasted approx 15-20 seconds     Positional Sensitivities   Positional Sensitivities Comments R sidelying > sit 3-4/10 dizziness, L sidelying <> mild dizziness.                       Vestibular Treatment/Exercise - 12/11/21 0846       Vestibular Treatment/Exercise   Vestibular Treatment Provided Canalith Repositioning    Canalith Repositioning Comment      OTHER   Comment Deep head hang x3 reps due to downbeating nystagmus without torsional component. Performed on reclining table due to hx of cervical surgeries and ROM limitations.                    PT Education - 12/11/21 0850     Education Details Clinical findings, results of Marye Round, purpose of CRM    Person(s) Educated Patient    Methods Explanation    Comprehension Verbalized understanding              PT Short Term Goals - 11/26/21 2057       PT SHORT TERM GOAL #1   Title Pt will be independent with initial HEP in order to build upon functional gains made in therapy. ALL STGS DUE 12/17/21    Time 3    Period Weeks    Status New    Target Date 12/17/21      PT SHORT TERM GOAL #2   Title Pt will decr 5x sit <> stand time to 16 seconds or less without UE support in order to demo improved functional strength/decr fall risk.    Baseline 18.13 seconds    Time 3    Period Weeks    Status New      PT SHORT TERM GOAL #3   Title Pt will improve FGA score to at least a 23/30 in order to demo decr fall risk.    Baseline 21/30    Time 3    Period Weeks    Status New               PT Long Term Goals - 12/08/21 1003       PT LONG TERM GOAL #1   Title Pt will be independent with final HEP in order to build upon functional gains made in therapy.  ALL LTGS DUE 01/07/22    Time 6    Period Weeks    Status  New  Target Date 01/07/22      PT LONG TERM GOAL #2   Title Pt will improve FGA score to at least a 26/30 in order to demo decr fall risk.    Baseline 21/30    Time 6    Period Weeks    Status New      PT LONG TERM GOAL #3   Title Pt will decr 5x sit <> stand time to 14 seconds or less without UE support in order to demo improved functional strength/decr fall risk.    Baseline 18.13 seconds    Time 6    Period Weeks    Status New      PT LONG TERM GOAL #4   Title Pt will perform anterior and posterior push and release test in 1 robust step in order to demo improved stepping strategies for balance.    Baseline 2 steps.    Time 6    Period Weeks    Status New      PT LONG TERM GOAL #5   Title Pt will improve DVA to at least line 6 in order to demo improved VOR.    Baseline static: line 8, dyanmic: line 4    Time 6    Period Weeks    Status New                   Plan - 12/11/21 1219     Clinical Impression Statement Finished vestibular assessment today with pt demonstrating downbeating vertical nystagmus with Harland Dingwall. Pt also with incr dizzines from R and L sidelying to sit. Due to being unable to determine rotary direction of vertical nystagmus, performed deep heading hanging x3 reps. Pt reporting mild symptoms afterwards. Will re-assess at next session.    Personal Factors and Comorbidities Comorbidity 3+;Past/Current Experience;Time since onset of injury/illness/exacerbation    Comorbidities L ankle fx 2005, R TKA, memory problems, hx of cervical and lumbar surgeries.    Examination-Activity Limitations Locomotion Level    Examination-Participation Restrictions Community Activity    Stability/Clinical Decision Making Evolving/Moderate complexity    Rehab Potential Good    PT Frequency 2x / week    PT Duration 12 weeks    PT Treatment/Interventions ADLs/Self Care Home Management;Aquatic Therapy;Gait training;Stair training;Functional mobility  training;Therapeutic activities;Neuromuscular re-education;Balance training;Therapeutic exercise;Patient/family education;Energy conservation;Vestibular;Canalith Repostioning    PT Next Visit Plan re-assess dix hallpike. give VOR x1 exercises for home. perform balance master?? balance - compliant surfaces, SLS, vestibular input, tandem. functional BLE strength.  SciFit/Nustep for endurance.    Consulted and Agree with Plan of Care Patient             Patient will benefit from skilled therapeutic intervention in order to improve the following deficits and impairments:  Abnormal gait, Decreased balance, Decreased activity tolerance, Decreased strength, Difficulty walking, Postural dysfunction, Decreased coordination  Visit Diagnosis: Unsteadiness on feet  Muscle weakness (generalized)  Other abnormalities of gait and mobility  Dizziness and giddiness     Problem List Patient Active Problem List   Diagnosis Date Noted   Arthrofibrosis of knee joint, right 06/11/2019   Hx of total knee arthroplasty, right 12/04/2017   Atypical chest pain    Mild cognitive impairment with memory loss 04/29/2013   Cervical spondylosis with radiculopathy 12/11/2011   FECAL OCCULT BLOOD 02/18/2009   ESOPHAGEAL STRICTURE 02/16/2009   GERD 02/16/2009   MENOPAUSE, SURGICAL 02/24/2008   SKIN CANCER, HX OF 02/24/2008   Personal history of other diseases of  digestive system 02/24/2008    Arliss Journey, PT, DPT  12/11/2021, 12:37 PM  [facility] [address] [place], [place], [address] Phone: [phone]   Fax:  [phone]  Name: RAYLEIGH GILLYARD MRN: [id number] Date of Birth: 02-25-1951

## 2021-12-14 ENCOUNTER — Other Ambulatory Visit: Payer: Self-pay

## 2021-12-14 ENCOUNTER — Encounter: Payer: Self-pay | Admitting: Physical Therapy

## 2021-12-14 ENCOUNTER — Ambulatory Visit: Payer: Medicare PPO | Admitting: Physical Therapy

## 2021-12-14 DIAGNOSIS — R42 Dizziness and giddiness: Secondary | ICD-10-CM

## 2021-12-14 DIAGNOSIS — R2689 Other abnormalities of gait and mobility: Secondary | ICD-10-CM

## 2021-12-14 DIAGNOSIS — M6281 Muscle weakness (generalized): Secondary | ICD-10-CM

## 2021-12-14 DIAGNOSIS — R2681 Unsteadiness on feet: Secondary | ICD-10-CM | POA: Diagnosis not present

## 2021-12-14 NOTE — Patient Instructions (Signed)
Access Code: [email or web address] URL: [email or web address] Date: 12/14/2021 Prepared by: Janann August  Exercises Standing Marching - 2 x daily - 5 x weekly - 3 sets Tandem Walking with Counter Support - 2 x daily - 5 x weekly - 3 sets Sit to Stand - 2 x daily - 5 x weekly - 2 sets - 5 reps Romberg Stance Eyes Closed on Foam Pad - 1-2 x daily - 5 x weekly - 3 sets - 30 hold Wide Stance with Eyes Closed on Foam Pad - 1-2 x daily - 5 x weekly - 2 sets - 5 reps Seated Vestibular Gaze Fixation with Head Rotation - 2 x daily - 5 x weekly - 3 sets - 30 hold Seated Gaze Stabilization with Head Nod - 2 x daily - 5 x weekly - 3 sets - 30 hold

## 2021-12-14 NOTE — Therapy (Addendum)
[facility] [address] [place], [place], [address] Phone: [phone]   Fax:  [phone]  Physical Therapy Treatment  Patient Details  Name: [name] Stone MRN: [id number] Date of Birth: [birth year] Referring Provider (PT): Lawerance Cruel, MD   Encounter Date: 12/14/2021   PT End of Session - 12/14/21 0809     Visit Number 5    Number of Visits 13    Date for PT Re-Evaluation 02/20/22    Authorization Type [employer] - Medicare    PT Start Time [phone]   pt late due to weather.   PT Stop Time 0845    PT Time Calculation (min) 37 min    Equipment Utilized During Treatment Gait belt    Activity Tolerance Patient tolerated treatment well    Behavior During Therapy WFL for tasks assessed/performed             Past Medical History:  Diagnosis Date   Ankle fracture, left 2005   Arthritis    Cancer ([place])    basal cell ca removed from arm   Complaints of memory disturbance    evaluated by Dr  Marijean Bravo   Depression    Dysrhythmia    Has PACs   Esophageal stricture    Esophagitis    Family history of adverse reaction to anesthesia    MOTHER ALWAYS HAS N/V WITH SURGERY    GERD (gastroesophageal reflux disease)    History of environmental allergies    GETS HIVES OCCASIONALLY WHEN NOT USING JUICE PLUS PILLS    Pneumonia    years ago   Premature atrial contractions    followed by DR Wynonia Lawman     Past Surgical History:  Procedure Laterality Date   ABDOMINAL HYSTERECTOMY  1998   ANTERIOR CERVICAL DECOMP/DISCECTOMY FUSION  12/11/2011   Procedure: ANTERIOR CERVICAL DECOMPRESSION/DISCECTOMY FUSION 2 LEVELS;  Surgeon: Earleen Newport;  Location: [facility];  Service: Neurosurgery;  Laterality: N/A;  C5-6 C6-7 Anterior cervical decompression/diskectomy fusion   ANTERIOR FUSION CERVICAL SPINE  2000   COLONOSCOPY     every 5 years for FH colon polyps   CORNEAL TRANSPLANT     ESOPHAGEAL MANOMETRY N/A 05/25/2016    Procedure: ESOPHAGEAL MANOMETRY (EM);  Surgeon: Manus Gunning, MD;  Location: [facility];  Service: Gastroenterology;  Laterality: N/A;   EYE SURGERY     x 5 right eye   INCONTINENCE SURGERY     KNEE ARTHROSCOPY Right 2015   KNEE ARTHROSCOPY W/ LATERAL RELEASE Right    KNEE ARTHROSCOPY W/ MENISCAL REPAIR Left    KNEE CLOSED REDUCTION Right 02/10/2018   Procedure: CLOSED MANIPULATION RIGHT KNEE;  Surgeon: Latanya Maudlin, MD;  Location: [facility];  Service: Orthopedics;  Laterality: Right;   NISSEN FUNDOPLICATION  02/72/5366   SCAR DEBRIDEMENT OF TOTAL KNEE Right 06/11/2019   Procedure: Irrigation and debridement excision of scar right total knee arthroplasty, with  poly revision;  Surgeon: Paralee Cancel, MD;  Location: [facility];  Service: Orthopedics;  Laterality: Right;  90 mins   TOTAL KNEE ARTHROPLASTY Right 12/04/2017   Procedure: RIGHT TOTAL KNEE ARTHROPLASTY;  Surgeon: Latanya Maudlin, MD;  Location: [facility];  Service: Orthopedics;  Laterality: Right;    There were no vitals filed for this visit.   Subjective Assessment - 12/14/21 0810     Subjective After CRM on Monday, felt like her head was heavy the rest of the day. Felt like a headache was coming on, but it never  did. No dizziness episodes since then.    Pertinent History PMH: L ankle fx 2005, R TKA, memory problems, hx of cervical and lumbar surgeries.    Limitations Walking    Patient Stated Goals check how balance is doing.    Currently in Pain? No/denies                                Vestibular Treatment/Exercise - 12/14/21 0811       Vestibular Treatment/Exercise   Vestibular Treatment Provided Gaze    Gaze Exercises X1 Viewing Horizontal;X1 Viewing Vertical      X1 Viewing Horizontal   Foot Position Seated, feet on floor    Reps 4    Comments x30 seconds, mild dizziness 2-3/10, cues for technique      X1 Viewing Vertical   Foot Position Seated, feet on floor    Reps 3    Comments  x30 seconds, incr difficulty keeping focus on X when looking down                Balance Exercises - 12/14/21 0838       Balance Exercises: Standing   Standing Eyes Opened Foam/compliant surface;Limitations    Standing Eyes Opened Limitations Feet hip width, 2 x 10 reps head turns, 2 x 10 reps head nods. Initial difficulty with head nods with mild dizziness, improved with incr reps    Standing Eyes Closed Foam/compliant surface;4 reps;30 secs;Limitations    Standing Eyes Closed Limitations Feet hip width and then slightly more narrow    Marching Foam/compliant surface;Limitations    Marching Limitations On air ex, slow marching x10 reps each leg.                PT Education - 12/14/21 0846     Education Details Seated VOR exercises added to HEP and purpose of exercise, reviewed previous HEP and purpose of each exercise (pt had not been able to perform consistently at home)    Person(s) Educated Patient    Methods Explanation;Demonstration;Handout;Verbal cues    Comprehension Verbalized understanding;Returned demonstration              PT Short Term Goals - 11/26/21 2057       PT SHORT TERM GOAL #1   Title Pt will be independent with initial HEP in order to build upon functional gains made in therapy. ALL STGS DUE 12/17/21    Time 3    Period Weeks    Status New    Target Date 12/17/21      PT SHORT TERM GOAL #2   Title Pt will decr 5x sit <> stand time to 16 seconds or less without UE support in order to demo improved functional strength/decr fall risk.    Baseline 18.13 seconds    Time 3    Period Weeks    Status New      PT SHORT TERM GOAL #3   Title Pt will improve FGA score to at least a 23/30 in order to demo decr fall risk.    Baseline 21/30    Time 3    Period Weeks    Status New               PT Long Term Goals - 12/08/21 1003       PT LONG TERM GOAL #1   Title Pt will be independent with final HEP in order to build upon functional  gains made in therapy.  ALL LTGS DUE 01/07/22    Time 6    Period Weeks    Status New    Target Date 01/07/22      PT LONG TERM GOAL #2   Title Pt will improve FGA score to at least a 26/30 in order to demo decr fall risk.    Baseline 21/30    Time 6    Period Weeks    Status New      PT LONG TERM GOAL #3   Title Pt will decr 5x sit <> stand time to 14 seconds or less without UE support in order to demo improved functional strength/decr fall risk.    Baseline 18.13 seconds    Time 6    Period Weeks    Status New      PT LONG TERM GOAL #4   Title Pt will perform anterior and posterior push and release test in 1 robust step in order to demo improved stepping strategies for balance.    Baseline 2 steps.    Time 6    Period Weeks    Status New      PT LONG TERM GOAL #5   Title Pt will improve DVA to at least line 6 in order to demo improved VOR.    Baseline static: line 8, dyanmic: line 4    Time 6    Period Weeks    Status New                   Plan - 12/14/21 1214     Clinical Impression Statement Today's skilled session focused on initiating seated VOR x1 exercises for HEP and standing balance on unlevel surfaces. With VOR x1, pt reporting mild dizziness with head turns after 30 seconds. When performing head turns on air ex, pt with mild dizziness during 1st set, but none after the 2nd. Will continue to progress towards LTGs.    Personal Factors and Comorbidities Comorbidity 3+;Past/Current Experience;Time since onset of injury/illness/exacerbation    Comorbidities L ankle fx 2005, R TKA, memory problems, hx of cervical and lumbar surgeries.    Examination-Activity Limitations Locomotion Level    Examination-Participation Restrictions Community Activity    Stability/Clinical Decision Making Evolving/Moderate complexity    Rehab Potential Good    PT Frequency 2x / week    PT Duration 12 weeks    PT Treatment/Interventions ADLs/Self Care Home Management;Aquatic  Therapy;Gait training;Stair training;Functional mobility training;Therapeutic activities;Neuromuscular re-education;Balance training;Therapeutic exercise;Patient/family education;Energy conservation;Vestibular;Canalith Repostioning    PT Next Visit Plan re-assess dix hallpike and treat as needed. give VOR x1 exercises for home. perform balance master?? balance - compliant surfaces, SLS, vestibular input, tandem. functional BLE strength.  SciFit/Nustep for endurance.    Consulted and Agree with Plan of Care Patient             Patient will benefit from skilled therapeutic intervention in order to improve the following deficits and impairments:  Abnormal gait, Decreased balance, Decreased activity tolerance, Decreased strength, Difficulty walking, Postural dysfunction, Decreased coordination  Visit Diagnosis: Unsteadiness on feet  Muscle weakness (generalized)  Other abnormalities of gait and mobility  Dizziness and giddiness     Problem List Patient Active Problem List   Diagnosis Date Noted   Arthrofibrosis of knee joint, right 06/11/2019   Hx of total knee arthroplasty, right 12/04/2017   Atypical chest pain    Mild cognitive impairment with memory loss 04/29/2013   Cervical spondylosis with radiculopathy 12/11/2011  FECAL OCCULT BLOOD 02/18/2009   ESOPHAGEAL STRICTURE 02/16/2009   GERD 02/16/2009   MENOPAUSE, SURGICAL 02/24/2008   SKIN CANCER, HX OF 02/24/2008   Personal history of other diseases of digestive system 02/24/2008    Arliss Journey, PT, DPT  12/14/2021, 12:15 PM  [facility] [address] [place], [place], [address] Phone: [phone]   Fax:  [phone]  Name: DEARIA WILMOUTH MRN: [id number] Date of Birth: 10-07-1951

## 2021-12-18 ENCOUNTER — Other Ambulatory Visit: Payer: Self-pay

## 2021-12-18 ENCOUNTER — Ambulatory Visit: Payer: Medicare PPO | Admitting: Physical Therapy

## 2021-12-18 ENCOUNTER — Encounter: Payer: Self-pay | Admitting: Physical Therapy

## 2021-12-18 DIAGNOSIS — R2689 Other abnormalities of gait and mobility: Secondary | ICD-10-CM

## 2021-12-18 DIAGNOSIS — R42 Dizziness and giddiness: Secondary | ICD-10-CM

## 2021-12-18 DIAGNOSIS — M6281 Muscle weakness (generalized): Secondary | ICD-10-CM | POA: Diagnosis not present

## 2021-12-18 DIAGNOSIS — R2681 Unsteadiness on feet: Secondary | ICD-10-CM

## 2021-12-18 NOTE — Therapy (Addendum)
[facility] [address] [place], [place], [address] Phone: [phone]   Fax:  [phone]  Physical Therapy Treatment  Patient Details  Name: [name] Stone MRN: [id number] Date of Birth: 02-25-1951 Referring Provider (PT): Lawerance Cruel, MD   Encounter Date: 12/18/2021   PT End of Session - 12/18/21 0805     Visit Number 6    Number of Visits 13    Date for PT Re-Evaluation 02/20/22    Authorization Type [employer] - Medicare    PT Start Time 0803    PT Stop Time 0843    PT Time Calculation (min) 40 min    Equipment Utilized During Treatment Gait belt    Activity Tolerance Patient tolerated treatment well    Behavior During Therapy [facility] for tasks assessed/performed             Past Medical History:  Diagnosis Date   Ankle fracture, left 2005   Arthritis    Cancer ([place])    basal cell ca removed from arm   Complaints of memory disturbance    evaluated by Dr  Marijean Bravo   Depression    Dysrhythmia    Has PACs   Esophageal stricture    Esophagitis    Family history of adverse reaction to anesthesia    MOTHER ALWAYS HAS N/V WITH SURGERY    GERD (gastroesophageal reflux disease)    History of environmental allergies    GETS HIVES OCCASIONALLY WHEN NOT USING JUICE PLUS PILLS    Pneumonia    years ago   Premature atrial contractions    followed by DR Wynonia Lawman     Past Surgical History:  Procedure Laterality Date   [place] DECOMP/DISCECTOMY FUSION  12/11/2011   Procedure: ANTERIOR CERVICAL DECOMPRESSION/DISCECTOMY FUSION 2 LEVELS;  Surgeon: Earleen Newport;  Location: [facility];  Service: Neurosurgery;  Laterality: N/A;  C5-6 C6-7 Anterior cervical decompression/diskectomy fusion   ANTERIOR FUSION CERVICAL SPINE  2000   COLONOSCOPY     every 5 years for FH colon polyps   CORNEAL TRANSPLANT     ESOPHAGEAL MANOMETRY N/A 05/25/2016   Procedure: ESOPHAGEAL MANOMETRY  (EM);  Surgeon: Manus Gunning, MD;  Location: [facility];  Service: Gastroenterology;  Laterality: N/A;   EYE SURGERY     x 5 right eye   INCONTINENCE SURGERY     KNEE ARTHROSCOPY Right 2015   KNEE ARTHROSCOPY W/ LATERAL RELEASE Right    KNEE ARTHROSCOPY W/ MENISCAL REPAIR Left    KNEE CLOSED REDUCTION Right 02/10/2018   Procedure: CLOSED MANIPULATION RIGHT KNEE;  Surgeon: Latanya Maudlin, MD;  Location: [facility];  Service: Orthopedics;  Laterality: Right;   NISSEN FUNDOPLICATION  74/11/8785   SCAR DEBRIDEMENT OF TOTAL KNEE Right 06/11/2019   Procedure: Irrigation and debridement excision of scar right total knee arthroplasty, with  poly revision;  Surgeon: Paralee Cancel, MD;  Location: [facility];  Service: Orthopedics;  Laterality: Right;  90 mins   TOTAL KNEE ARTHROPLASTY Right 12/04/2017   Procedure: RIGHT TOTAL KNEE ARTHROPLASTY;  Surgeon: Latanya Maudlin, MD;  Location: [facility];  Service: Orthopedics;  Laterality: Right;    There were no vitals filed for this visit.   Subjective Assessment - 12/18/21 0806     Subjective Hasn't felt dizzy or unstable. Forgot to do the letter exercises.    Pertinent History PMH: L ankle fx 2005, R TKA, memory problems, hx of cervical and lumbar surgeries.  Limitations Walking    Patient Stated Goals check how balance is doing.    Currently in Pain? No/denies                     Vestibular Assessment - 12/18/21 0815       Dix-Hallpike Right   Dix-Hallpike Right Symptoms Downbeat Nystagmus   mild sx, lasting ~20 seconds     Dix-Hallpike Left   Dix-Hallpike Left Symptoms No nystagmus                       Vestibular Treatment/Exercise - 12/18/21 0831       Vestibular Treatment/Exercise   Vestibular Treatment Provided Canalith Repositioning      OTHER   Comment Deep head hang x3 reps due to downbeating nystagmus without torsional component. Performed on reclining table due to hx of cervical surgeries and ROM  limitations. Pt reporting sx with first rep, and mild sx when coming up to sitting. Overall, better response today after first performing last week. Needing seated rest break between each.                    PT Education - 12/18/21 0838     Education Details Educated on BPPV while using canal model. Verbally reviewed VOR exercises given at last session.    Person(s) Educated Patient    Methods Explanation;Demonstration    Comprehension Verbalized understanding              PT Short Term Goals - 11/26/21 2057       PT SHORT TERM GOAL #1   Title Pt will be independent with initial HEP in order to build upon functional gains made in therapy. ALL STGS DUE 12/17/21    Time 3    Period Weeks    Status New    Target Date 12/17/21      PT SHORT TERM GOAL #2   Title Pt will decr 5x sit <> stand time to 16 seconds or less without UE support in order to demo improved functional strength/decr fall risk.    Baseline 18.13 seconds    Time 3    Period Weeks    Status New      PT SHORT TERM GOAL #3   Title Pt will improve FGA score to at least a 23/30 in order to demo decr fall risk.    Baseline 21/30    Time 3    Period Weeks    Status New               PT Long Term Goals - 12/08/21 1003       PT LONG TERM GOAL #1   Title Pt will be independent with final HEP in order to build upon functional gains made in therapy.  ALL LTGS DUE 01/07/22    Time 6    Period Weeks    Status New    Target Date 01/07/22      PT LONG TERM GOAL #2   Title Pt will improve FGA score to at least a 26/30 in order to demo decr fall risk.    Baseline 21/30    Time 6    Period Weeks    Status New      PT LONG TERM GOAL #3   Title Pt will decr 5x sit <> stand time to 14 seconds or less without UE support in order to demo improved functional strength/decr fall risk.  Baseline 18.13 seconds    Time 6    Period Weeks    Status New      PT LONG TERM GOAL #4   Title Pt will perform  anterior and posterior push and release test in 1 robust step in order to demo improved stepping strategies for balance.    Baseline 2 steps.    Time 6    Period Weeks    Status New      PT LONG TERM GOAL #5   Title Pt will improve DVA to at least line 6 in order to demo improved VOR.    Baseline static: line 8, dyanmic: line 4    Time 6    Period Weeks    Status New                   Plan - 12/18/21 0854     Clinical Impression Statement Re-assessed [name] Stone today with pt demonstrating downbeating nystamus when tested to the R. Still no rotary component noted. Performed the deep head hang again to treat anterior canal BPPV. Pt with good response to treatment today, only having mild sx during 1st rep. Otherwise only felt dizzy when coming back to sitting. Will continue to progress towards LTGs.    Personal Factors and Comorbidities Comorbidity 3+;Past/Current Experience;Time since onset of injury/illness/exacerbation    Comorbidities L ankle fx 2005, R TKA, memory problems, hx of cervical and lumbar surgeries.    Examination-Activity Limitations Locomotion Level    Examination-Participation Restrictions Community Activity    Stability/Clinical Decision Making Evolving/Moderate complexity    Rehab Potential Good    PT Frequency 2x / week    PT Duration 12 weeks    PT Treatment/Interventions ADLs/Self Care Home Management;Aquatic Therapy;Gait training;Stair training;Functional mobility training;Therapeutic activities;Neuromuscular re-education;Balance training;Therapeutic exercise;Patient/family education;Energy conservation;Vestibular;Canalith Repostioning    PT Next Visit Plan check STGs. re-assess dix hallpike and treat as needed (try in prone). VOR x1 exercises.  perform balance master?? balance - compliant surfaces, SLS, vestibular input, tandem. functional BLE strength.  SciFit/Nustep for endurance.    Consulted and Agree with Plan of Care Patient              Patient will benefit from skilled therapeutic intervention in order to improve the following deficits and impairments:  Abnormal gait, Decreased balance, Decreased activity tolerance, Decreased strength, Difficulty walking, Postural dysfunction, Decreased coordination  Visit Diagnosis: Unsteadiness on feet  Muscle weakness (generalized)  Other abnormalities of gait and mobility  Dizziness and giddiness     Problem List Patient Active Problem List   Diagnosis Date Noted   Arthrofibrosis of knee joint, right 06/11/2019   Hx of total knee arthroplasty, right 12/04/2017   Atypical chest pain    Mild cognitive impairment with memory loss 04/29/2013   Cervical spondylosis with radiculopathy 12/11/2011   FECAL OCCULT BLOOD 02/18/2009   ESOPHAGEAL STRICTURE 02/16/2009   GERD 02/16/2009   MENOPAUSE, SURGICAL 02/24/2008   SKIN CANCER, HX OF 02/24/2008   Personal history of other diseases of digestive system 02/24/2008    Arliss Journey, PT, DPT 12/18/2021, 12:03 PM  [facility] [address] [place], [place], [address] Phone: [phone]   Fax:  [phone]  Name: [name] Stone MRN: [id number] Date of Birth: July 22, 1951

## 2021-12-20 ENCOUNTER — Ambulatory Visit: Payer: Medicare PPO | Admitting: Physical Therapy

## 2021-12-22 ENCOUNTER — Emergency Department (INDEPENDENT_AMBULATORY_CARE_PROVIDER_SITE_OTHER): Payer: Medicare PPO

## 2021-12-22 ENCOUNTER — Other Ambulatory Visit: Payer: Self-pay

## 2021-12-22 ENCOUNTER — Emergency Department
Admission: RE | Admit: 2021-12-22 | Discharge: 2021-12-22 | Disposition: A | Discharge status: Home / Self Care | Payer: Medicare PPO | Source: Ambulatory Visit | Attending: Family Medicine | Admitting: Family Medicine

## 2021-12-22 VITALS — BP 131/84 | HR 93 | Temp 99.5°F | Resp 17 | Ht 64.0 in | Wt 182.0 lb

## 2021-12-22 DIAGNOSIS — J111 Influenza due to unidentified influenza virus with other respiratory manifestations: Secondary | ICD-10-CM | POA: Diagnosis not present

## 2021-12-22 DIAGNOSIS — R059 Cough, unspecified: Secondary | ICD-10-CM

## 2021-12-22 LAB — POCT INFLUENZA A/B
Influenza A, POC: NEGATIVE
Influenza B, POC: NEGATIVE

## 2021-12-22 LAB — POC SARS CORONAVIRUS 2 AG -  ED: SARS Coronavirus 2 Ag: NEGATIVE

## 2021-12-22 MED ORDER — OSELTAMIVIR PHOSPHATE 75 MG PO CAPS: 75.0000 mg | ORAL_CAPSULE | Freq: Two times a day (BID) | ORAL | 0 refills | Status: AC

## 2021-12-22 NOTE — Discharge Instructions (Addendum)
Take plain guaifenesin (1200mg  extended release tabs such as Mucinex) twice daily, with plenty of water, for cough and congestion. Get adequate rest.   May use Afrin nasal spray (or generic oxymetazoline) each morning for about 5 days and then discontinue.  Also recommend using saline nasal spray several times daily and saline nasal irrigation (AYR is a common brand).  Use Flonase nasal spray each morning after using Afrin nasal spray and saline nasal irrigation. Try warm salt water gargles for sore throat.  May take Delsym Cough Suppressant ("12 Hour Cough Relief") at bedtime for nighttime cough.  May take Tylenol as needed for fever, body aches, etc.   If symptoms become significantly worse during the night or over the weekend, proceed to the local emergency room.

## 2021-12-22 NOTE — ED Provider Notes (Signed)
[facility]    CSN: [id number] Arrival date & time: 12/22/21  1052      History   Chief Complaint Chief Complaint  Patient presents with   Generalized Body Aches   Cough    HPI [name] Stone is a 70 y.o. female.   Two days ago patient developed a mild cough, headache and nasal congestion.  Yesterday she developed a sore throat and fever to 100.3 which increased to 103.6 last night.  She had a negative home COVID test yesterday.  The history is provided by the patient.   Past Medical History:  Diagnosis Date   Ankle fracture, left 2005   Arthritis    Cancer ([place])    basal cell ca removed from arm   Complaints of memory disturbance    evaluated by Dr  Marijean Bravo   Depression    Dysrhythmia    Has PACs   Esophageal stricture    Esophagitis    Family history of adverse reaction to anesthesia    MOTHER ALWAYS HAS N/V WITH SURGERY    GERD (gastroesophageal reflux disease)    History of environmental allergies    GETS HIVES OCCASIONALLY WHEN NOT USING JUICE PLUS PILLS    Pneumonia    years ago   Premature atrial contractions    followed by DR Wynonia Lawman     Patient Active Problem List   Diagnosis Date Noted   Arthrofibrosis of knee joint, right 06/11/2019   Hx of total knee arthroplasty, right 12/04/2017   Atypical chest pain    Mild cognitive impairment with memory loss 04/29/2013   Cervical spondylosis with radiculopathy 12/11/2011   FECAL OCCULT BLOOD 02/18/2009   ESOPHAGEAL STRICTURE 02/16/2009   GERD 02/16/2009   MENOPAUSE, SURGICAL 02/24/2008   SKIN CANCER, HX OF 02/24/2008   Personal history of other diseases of digestive system 02/24/2008    Past Surgical History:  Procedure Laterality Date   [place] DECOMP/DISCECTOMY FUSION  12/11/2011   Procedure: ANTERIOR CERVICAL DECOMPRESSION/DISCECTOMY FUSION 2 LEVELS;  Surgeon: Earleen Newport;  Location: [facility];  Service: Neurosurgery;  Laterality: N/A;  C5-6  C6-7 Anterior cervical decompression/diskectomy fusion   ANTERIOR FUSION CERVICAL SPINE  2000   COLONOSCOPY     every 5 years for FH colon polyps   CORNEAL TRANSPLANT     ESOPHAGEAL MANOMETRY N/A 05/25/2016   Procedure: ESOPHAGEAL MANOMETRY (EM);  Surgeon: Manus Gunning, MD;  Location: [facility];  Service: Gastroenterology;  Laterality: N/A;   EYE SURGERY     x 5 right eye   INCONTINENCE SURGERY     KNEE ARTHROSCOPY Right 2015   KNEE ARTHROSCOPY W/ LATERAL RELEASE Right    KNEE ARTHROSCOPY W/ MENISCAL REPAIR Left    KNEE CLOSED REDUCTION Right 02/10/2018   Procedure: CLOSED MANIPULATION RIGHT KNEE;  Surgeon: Latanya Maudlin, MD;  Location: [facility];  Service: Orthopedics;  Laterality: Right;   [place] FUNDOPLICATION  44/02/4741   SCAR DEBRIDEMENT OF TOTAL KNEE Right 06/11/2019   Procedure: Irrigation and debridement excision of scar right total knee arthroplasty, with  poly revision;  Surgeon: Paralee Cancel, MD;  Location: [facility];  Service: Orthopedics;  Laterality: Right;  90 mins   TOTAL KNEE ARTHROPLASTY Right 12/04/2017   Procedure: RIGHT TOTAL KNEE ARTHROPLASTY;  Surgeon: Latanya Maudlin, MD;  Location: [facility];  Service: Orthopedics;  Laterality: Right;    OB History   No obstetric history on file.  Home Medications    Prior to Admission medications   Medication Sig Start Date End Date Taking? Authorizing Provider  cholecalciferol (VITAMIN D3) 25 MCG (1000 UNIT) tablet Take 1,000 Units by mouth daily.   Yes [provider]  oseltamivir (TAMIFLU) 75 MG capsule Take 1 capsule (75 mg total) by mouth 2 (two) times daily for 5 days. 12/22/21 12/27/21 Yes Kandra Nicolas, MD  acetaminophen (TYLENOL) 500 MG tablet Take 2 tablets (1,000 mg total) by mouth every 8 (eight) hours. Patient not taking: Reported on 11/22/2021 06/12/19   Danae Orleans, PA-C  albuterol (VENTOLIN HFA) 108 (90 Base) MCG/ACT inhaler Inhale 1-2 puffs into the lungs every 6 (six) hours as  needed for wheezing or shortness of breath. Patient not taking: Reported on 11/22/2021 12/28/20   Robyn Haber, MD  cetirizine (ZYRTEC) 10 MG tablet Take 10 mg by mouth daily.     [provider]  Cholecalciferol 25 MCG (1000 UT) tablet Take 1 tablet by mouth daily.    [provider]  esomeprazole (NEXIUM) 40 MG capsule Take 40 mg by mouth daily.     [provider]  estradiol (CLIMARA - DOSED IN MG/24 HR) 0.0375 mg/24hr patch Place 1 patch onto the skin 2 (two) times a week.     [provider]  LOTEMAX 0.5 % ophthalmic suspension Place 1 drop into the right eye every Wednesday.  Patient not taking: Reported on 11/22/2021 10/09/17   [provider]  meclizine (ANTIVERT) 25 MG tablet Take 1 tablet by mouth daily as needed for dizziness. 03/02/20   [provider]  metoprolol succinate (TOPROL-XL) 25 MG 24 hr tablet TAKE 1 TABLET(25 MG) BY MOUTH DAILY 06/27/21   Allred, Jeneen Rinks, MD  ondansetron (ZOFRAN) 4 MG tablet Take 1 tablet (4 mg total) by mouth every 8 (eight) hours as needed for nausea or vomiting. Patient not taking: Reported on 12/22/2021 06/12/19   Danae Orleans, PA-C  prednisoLONE acetate (PRED FORTE) 1 % ophthalmic suspension Place 1 drop into both eyes once a week.    [provider]  sodium chloride (MURO 128) 5 % ophthalmic solution Place 1 drop into both eyes as needed for eye irritation.    [provider]    Family History Family History  Problem Relation Age of Onset   Skin cancer Father    Bladder Cancer Father    Skin cancer Brother    Skin cancer Paternal Grandfather    Colon polyps Brother    Colitis Mother    Colon cancer Neg Hx     Social History Social History   Tobacco Use   Smoking status: Never   Smokeless tobacco: Never  Vaping Use   Vaping Use: Never used  Substance Use Topics   Alcohol use: No   Drug use: No     Allergies   Aspirin and Demerol [meperidine hcl]   Review  of Systems Review of Systems + sore throat + cough No pleuritic pain ? wheezing + nasal congestion + post-nasal drainage No sinus pain/pressure No itchy/red eyes No earache No hemoptysis No SOB + fever, + chills No nausea No vomiting No abdominal pain No diarrhea No urinary symptoms No skin rash + fatigue + myalgias No headache Used OTC meds (Mucinex) without relief   Physical Exam Triage Vital Signs ED Triage Vitals  Enc Vitals Group     BP 12/22/21 1113 131/84     Pulse Rate 12/22/21 1113 93     Resp 12/22/21  1113 17     Temp 12/22/21 1113 99.5 F (37.5 C)     Temp Source 12/22/21 1113 Oral     SpO2 12/22/21 1113 96 %     Weight 12/22/21 1119 182 lb (82.6 kg)     Height 12/22/21 1119 5\' 4"  (1.626 m)     Head Circumference --      Peak Flow --      Pain Score 12/22/21 1117 3     Pain Loc --      Pain Edu? --      Excl. in [place]? --    No data found.  Updated Vital Signs BP 131/84 (BP Location: Left Arm)    Pulse 93    Temp 99.5 F (37.5 C) (Oral)    Resp 17    Ht 5\' 4"  (1.626 m)    Wt 82.6 kg    SpO2 96%    BMI 31.24 kg/m   Visual Acuity Right Eye Distance:   Left Eye Distance:   Bilateral Distance:    Right Eye Near:   Left Eye Near:    Bilateral Near:     Physical Exam Nursing notes and Vital Signs reviewed. Appearance:  Patient appears stated age, and in no acute distress Eyes:  Pupils are equal, round, and reactive to light and accomodation.  Extraocular movement is intact.  Conjunctivae are not inflamed  Ears:  Canals normal.  Tympanic membranes normal.  Nose:  Mildly congested turbinates.  No sinus tenderness.  Pharynx:  Normal Neck:  Supple.  Mildly enlarged lateral nodes are present, tender to palpation on the left.   Lungs:  Clear to auscultation.  Breath sounds are equal.  Moving air well. Heart:  Regular rate and rhythm without murmurs, rubs, or gallops.  Abdomen:  Nontender without masses or hepatosplenomegaly.  Bowel sounds are  present.  No CVA or flank tenderness.  Extremities:  No edema.  Skin:  No rash present.   UC Treatments / Results  Labs (all labs ordered are listed, but only abnormal results are displayed) Labs Reviewed  COVID-19, FLU A+B AND RSV  POC SARS CORONAVIRUS 2 AG -  ED negative  POCT INFLUENZA A/B negative    EKG   Radiology DG Chest 2 View  Result Date: 12/22/2021 CLINICAL DATA:  Cough EXAM: CHEST - 2 VIEW COMPARISON:  12/29/2020 FINDINGS: The heart size and mediastinal contours are within normal limits. Both lungs are clear. No pleural effusion. Cervical spine anterior fusion. No acute osseous abnormality. IMPRESSION: No active cardiopulmonary disease. Electronically Signed   By: Macy Mis M.D.   On: 12/22/2021 11:53    Procedures Procedures (including critical care time)  Medications Ordered in UC Medications - No data to display  Initial Impression / Assessment and Plan / UC Course  I have reviewed the triage vital signs and the nursing notes.  Pertinent labs & imaging results that were available during my care of the patient were reviewed by me and considered in my medical decision making (see chart for details).    ?false negative flu test.  Begin Tamiflu. Followup with Family Doctor if not improved in one week.   Final Clinical Impressions(s) / UC Diagnoses   Final diagnoses:  Influenza-like illness     Discharge Instructions      Take plain guaifenesin (1200mg  extended release tabs such as Mucinex) twice daily, with plenty of water, for cough and congestion. Get adequate rest.   May use Afrin nasal spray (or generic  oxymetazoline) each morning for about 5 days and then discontinue.  Also recommend using saline nasal spray several times daily and saline nasal irrigation (AYR is a common brand).  Use Flonase nasal spray each morning after using Afrin nasal spray and saline nasal irrigation. Try warm salt water gargles for sore throat.  May take Delsym Cough  Suppressant ("12 Hour Cough Relief") at bedtime for nighttime cough.  May take Tylenol as needed for fever, body aches, etc.   If symptoms become significantly worse during the night or over the weekend, proceed to the local emergency room.     ED Prescriptions     Medication Sig Dispense Auth. Provider   oseltamivir (TAMIFLU) 75 MG capsule Take 1 capsule (75 mg total) by mouth 2 (two) times daily for 5 days. 10 capsule Kandra Nicolas, MD         Kandra Nicolas, MD 12/24/21 [phone]

## 2021-12-22 NOTE — ED Triage Notes (Signed)
Fever & body aches since Tuesday Fever 103 yesterday  Cough the last 2 days  OTC Tylenol (none today) Mucinex Sinus max yesterday helped   Negative home COVID test yesterday  Mother had COVID 3 weeks ago

## 2021-12-24 LAB — COVID-19, FLU A+B AND RSV
Influenza A, NAA: NOT DETECTED
Influenza B, NAA: NOT DETECTED
RSV, NAA: NOT DETECTED
SARS-CoV-2, NAA: NOT DETECTED

## 2021-12-27 ENCOUNTER — Encounter: Payer: Self-pay | Admitting: Physical Therapy

## 2021-12-27 ENCOUNTER — Ambulatory Visit: Payer: Medicare PPO | Admitting: Physical Therapy

## 2021-12-27 ENCOUNTER — Other Ambulatory Visit: Payer: Self-pay

## 2021-12-27 DIAGNOSIS — R2681 Unsteadiness on feet: Secondary | ICD-10-CM

## 2021-12-27 DIAGNOSIS — R42 Dizziness and giddiness: Secondary | ICD-10-CM | POA: Diagnosis not present

## 2021-12-27 DIAGNOSIS — M6281 Muscle weakness (generalized): Secondary | ICD-10-CM | POA: Diagnosis not present

## 2021-12-27 DIAGNOSIS — R2689 Other abnormalities of gait and mobility: Secondary | ICD-10-CM

## 2021-12-27 NOTE — Therapy (Signed)
[facility] [address] [place], [place], [address] Phone: [phone]   Fax:  [phone]  Physical Therapy Treatment  Patient Details  Name: [name] Stone MRN: [id number] Date of Birth: [birth year] Referring Provider (PT): Lawerance Cruel, MD   Encounter Date: 12/27/2021   PT End of Session - 12/27/21 0804     Visit Number 7    Number of Visits 13    Date for PT Re-Evaluation 02/20/22    Authorization Type [employer] - Medicare    PT Start Time 0802    PT Stop Time 0845    PT Time Calculation (min) 43 min    Equipment Utilized During Treatment Gait belt    Activity Tolerance Patient tolerated treatment well    Behavior During Therapy [facility] for tasks assessed/performed             Past Medical History:  Diagnosis Date   Ankle fracture, left 2005   Arthritis    Cancer ([place])    basal cell ca removed from arm   Complaints of memory disturbance    evaluated by Dr  Marijean Bravo   Depression    Dysrhythmia    Has PACs   Esophageal stricture    Esophagitis    Family history of adverse reaction to anesthesia    MOTHER ALWAYS HAS N/V WITH SURGERY    GERD (gastroesophageal reflux disease)    History of environmental allergies    GETS HIVES OCCASIONALLY WHEN NOT USING JUICE PLUS PILLS    Pneumonia    years ago   Premature atrial contractions    followed by DR Wynonia Lawman     Past Surgical History:  Procedure Laterality Date   [place] DECOMP/DISCECTOMY FUSION  12/11/2011   Procedure: ANTERIOR CERVICAL DECOMPRESSION/DISCECTOMY FUSION 2 LEVELS;  Surgeon: Earleen Newport;  Location: [facility];  Service: Neurosurgery;  Laterality: N/A;  C5-6 C6-7 Anterior cervical decompression/diskectomy fusion   ANTERIOR FUSION CERVICAL SPINE  2000   COLONOSCOPY     every 5 years for FH colon polyps   CORNEAL TRANSPLANT     ESOPHAGEAL MANOMETRY N/A 05/25/2016   Procedure: ESOPHAGEAL MANOMETRY  (EM);  Surgeon: Manus Gunning, MD;  [facility];  Service: Gastroenterology;  Laterality: N/A;   EYE SURGERY     x 5 right eye   INCONTINENCE SURGERY     KNEE ARTHROSCOPY Right 2015   KNEE ARTHROSCOPY W/ LATERAL RELEASE Right    KNEE ARTHROSCOPY W/ MENISCAL REPAIR Left    KNEE CLOSED REDUCTION Right 02/10/2018   Procedure: CLOSED MANIPULATION RIGHT KNEE;  Surgeon: Latanya Maudlin, MD;  [facility];  Service: Orthopedics;  Laterality: Right;   NISSEN FUNDOPLICATION  86/76/7209   SCAR DEBRIDEMENT OF TOTAL KNEE Right 06/11/2019   Procedure: Irrigation and debridement excision of scar right total knee arthroplasty, with  poly revision;  Surgeon: Paralee Cancel, MD;  [facility]: [facility];  Service: Orthopedics;  Laterality: Right;  90 mins   TOTAL KNEE ARTHROPLASTY Right 12/04/2017   Procedure: RIGHT TOTAL KNEE ARTHROPLASTY;  Surgeon: Latanya Maudlin, MD;  [facility];  Service: Orthopedics;  Laterality: Right;    There were no vitals filed for this visit.   Subjective Assessment - 12/27/21 0804     Subjective Was sick last week - not COVID or the flu. Hasn't noticed any dizziness in the past couple of days.    Pertinent History PMH: L ankle fx 2005, R  TKA, memory problems, hx of cervical and lumbar surgeries.    Limitations Walking    Patient Stated Goals check how balance is doing.    Currently in Pain? No/denies                [facility] PT Assessment - 12/27/21 0808       Functional Gait  Assessment   Gait assessed  Yes    Gait Level Surface Walks 20 ft in less than 5.5 sec, no assistive devices, good speed, no evidence for imbalance, normal gait pattern, deviates no more than 6 in outside of the 12 in walkway width.    Change in Gait Speed Able to smoothly change walking speed without loss of balance or gait deviation. Deviate no more than 6 in outside of the 12 in walkway width.    Gait with Horizontal Head Turns Performs head turns smoothly with no change in  gait. Deviates no more than 6 in outside 12 in walkway width    Gait with Vertical Head Turns Performs head turns with no change in gait. Deviates no more than 6 in outside 12 in walkway width.    Gait and Pivot Turn Pivot turns safely in greater than 3 sec and stops with no loss of balance, or pivot turns safely within 3 sec and stops with mild imbalance, requires small steps to catch balance.    Step Over Obstacle Is able to step over one shoe box (4.5 in total height) without changing gait speed. No evidence of imbalance.    Gait with Narrow Base of Support Ambulates 7-9 steps.    Gait with Eyes Closed Walks 20 ft, slow speed, abnormal gait pattern, evidence for imbalance, deviates 10-15 in outside 12 in walkway width. Requires more than 9 sec to ambulate 20 ft.   10 seconds   Ambulating Backwards Walks 20 ft, uses assistive device, slower speed, mild gait deviations, deviates 6-10 in outside 12 in walkway width.   13.87   Steps Alternating feet, no rail.    Total Score 24    FGA comment: medium fall risk                 Vestibular Assessment - 12/27/21 0821       Dix-Hallpike Right   Dix-Hallpike Right Symptoms No nystagmus   no dizziness     Dix-Hallpike Left   Dix-Hallpike Left Symptoms No nystagmus   no dizziness                     [facility] Adult PT Treatment/Exercise - 12/27/21 0810       Transfers   Five time sit to stand comments  12.4 seconds without UE support             Vestibular Treatment/Exercise - 12/27/21 0824       Vestibular Treatment/Exercise   Vestibular Treatment Provided Gaze      X1 Viewing Horizontal   Foot Position Seated, feet on floor    Reps 3    Comments x30 seconds - no sx, 2 x 45 seconds - mild sx. Discussed incr to 45 seconds at home      X1 Viewing Vertical   Foot Position Seated, feet on floor    Reps 3    Comments 2 x 30 seconds, x45 seconds - mild sx                Balance Exercises - 12/27/21 5093  Balance Exercises: Standing   SLS with Vectors Solid surface    SLS with Vectors Limitations Standing on level ground; forward then cross body cone tap to cones x5 reps each side, no UE support    Step Over Hurdles / Cones Stepping over 4 obstacles, down and back x5 reps, with alternating pattern, cues for slowed/controlled with no UE support                PT Education - 12/27/21 0937     Education Details Progress towards goals.    Person(s) Educated Patient    Methods Explanation    Comprehension Verbalized understanding              PT Short Term Goals - 12/27/21 0810       PT SHORT TERM GOAL #1   Title Pt will be independent with initial HEP in order to build upon functional gains made in therapy. ALL STGS DUE 12/17/21    Baseline pt independent with initial HEP    Time 3    Period Weeks    Status Achieved    Target Date 12/17/21      PT SHORT TERM GOAL #2   Title Pt will decr 5x sit <> stand time to 16 seconds or less without UE support in order to demo improved functional strength/decr fall risk.    Baseline 18.13 seconds; 12.40 seconds on 12/27/21    Time 3    Period Weeks    Status Achieved      PT SHORT TERM GOAL #3   Title Pt will improve FGA score to at least a 23/30 in order to demo decr fall risk.    Baseline 21/30; 24/30 on 12/27/21    Time 3    Period Weeks    Status Achieved               PT Long Term Goals - 12/08/21 1003       PT LONG TERM GOAL #1   Title Pt will be independent with final HEP in order to build upon functional gains made in therapy.  ALL LTGS DUE 01/07/22    Time 6    Period Weeks    Status New    Target Date 01/07/22      PT LONG TERM GOAL #2   Title Pt will improve FGA score to at least a 26/30 in order to demo decr fall risk.    Baseline 21/30    Time 6    Period Weeks    Status New      PT LONG TERM GOAL #3   Title Pt will decr 5x sit <> stand time to 14 seconds or less without UE support in order to  demo improved functional strength/decr fall risk.    Baseline 18.13 seconds    Time 6    Period Weeks    Status New      PT LONG TERM GOAL #4   Title Pt will perform anterior and posterior push and release test in 1 robust step in order to demo improved stepping strategies for balance.    Baseline 2 steps.    Time 6    Period Weeks    Status New      PT LONG TERM GOAL #5   Title Pt will improve DVA to at least line 6 in order to demo improved VOR.    Baseline static: line 8, dyanmic: line 4    Time 6  Period Weeks    Status New                   Plan - 12/27/21 0940     Clinical Impression Statement Checked pt's STGs today with pt meeting all 3 STGs. Pt improved 5x sit <> stand without UE support to 12.4 seconds (previously 18.13 seconds), indicating decr fall risk and improved functional BLE strength. Pt improved FGA score from a 21/30 to a 24/30. Re-assessed [employer] bilat, no nystagmus noted and pt with no reports of dizziness, indicating resolution of BPPV. Pt reporting she has not had any dizziness sx at home either. Will re-assess in the future if pt reports a return of dizziness. Will continue to progress towards LTGs.    Personal Factors and Comorbidities Comorbidity 3+;Past/Current Experience;Time since onset of injury/illness/exacerbation    Comorbidities L ankle fx 2005, R TKA, memory problems, hx of cervical and lumbar surgeries.    Examination-Activity Limitations Locomotion Level    Examination-Participation Restrictions Community Activity    Stability/Clinical Decision Making Evolving/Moderate complexity    Rehab Potential Good    PT Frequency 2x / week    PT Duration 12 weeks    PT Treatment/Interventions ADLs/Self Care Home Management;Aquatic Therapy;Gait training;Stair training;Functional mobility training;Therapeutic activities;Neuromuscular re-education;Balance training;Therapeutic exercise;Patient/family education;Energy  conservation;Vestibular;Canalith Repostioning    PT Next Visit Plan check STGs. re-assess dix hallpike if dizziness returns. VOR x1 exercises. balance - compliant surfaces, SLS, vestibular input, tandem. functional BLE strength.  SciFit/Nustep for endurance.    Consulted and Agree with Plan of Care Patient             Patient will benefit from skilled therapeutic intervention in order to improve the following deficits and impairments:  Abnormal gait, Decreased balance, Decreased activity tolerance, Decreased strength, Difficulty walking, Postural dysfunction, Decreased coordination  Visit Diagnosis: Unsteadiness on feet  Muscle weakness (generalized)  Other abnormalities of gait and mobility  Dizziness and giddiness     Problem List Patient Active Problem List   Diagnosis Date Noted   Arthrofibrosis of knee joint, right 06/11/2019   Hx of total knee arthroplasty, right 12/04/2017   Atypical chest pain    Mild cognitive impairment with memory loss 04/29/2013   Cervical spondylosis with radiculopathy 12/11/2011   FECAL OCCULT BLOOD 02/18/2009   ESOPHAGEAL STRICTURE 02/16/2009   GERD 02/16/2009   MENOPAUSE, SURGICAL 02/24/2008   SKIN CANCER, HX OF 02/24/2008   Personal history of other diseases of digestive system 02/24/2008    Arliss Journey, PT, DPT  12/27/2021, 9:42 AM  [facility] [address] [place], [place], [address] Phone: [phone]   Fax:  [phone]  Name: [name] Stone MRN: [id number] Date of Birth: 05-20-1951

## 2021-12-29 ENCOUNTER — Ambulatory Visit: Payer: Medicare PPO | Admitting: Physical Therapy

## 2021-12-29 ENCOUNTER — Other Ambulatory Visit: Payer: Self-pay

## 2021-12-29 ENCOUNTER — Encounter: Payer: Self-pay | Admitting: Physical Therapy

## 2021-12-29 DIAGNOSIS — R42 Dizziness and giddiness: Secondary | ICD-10-CM | POA: Diagnosis not present

## 2021-12-29 DIAGNOSIS — R2689 Other abnormalities of gait and mobility: Secondary | ICD-10-CM | POA: Diagnosis not present

## 2021-12-29 DIAGNOSIS — M6281 Muscle weakness (generalized): Secondary | ICD-10-CM | POA: Diagnosis not present

## 2021-12-29 DIAGNOSIS — R2681 Unsteadiness on feet: Secondary | ICD-10-CM | POA: Diagnosis not present

## 2021-12-29 NOTE — Therapy (Addendum)
[facility] [address] [place], [place], [address] Phone: [phone]   Fax:  [phone]  Physical Therapy Treatment  Patient Details  Name: [name] Stone MRN: [id number] Date of Birth: 02-18-1951 Referring Provider (PT): Lawerance Cruel, MD   Encounter Date: 12/29/2021   PT End of Session - 12/29/21 0804     Visit Number 8    Number of Visits 13    Date for PT Re-Evaluation 02/20/22    Authorization Type [employer] - Medicare    PT Start Time 0802    PT Stop Time 0841    PT Time Calculation (min) 39 min    Equipment Utilized During Treatment Gait belt    Activity Tolerance Patient tolerated treatment well    Behavior During Therapy [facility] for tasks assessed/performed             Past Medical History:  Diagnosis Date   Ankle fracture, left 2005   Arthritis    Cancer ([place])    basal cell ca removed from arm   Complaints of memory disturbance    evaluated by Dr  Marijean Bravo   Depression    Dysrhythmia    Has PACs   Esophageal stricture    Esophagitis    Family history of adverse reaction to anesthesia    MOTHER ALWAYS HAS N/V WITH SURGERY    GERD (gastroesophageal reflux disease)    History of environmental allergies    GETS HIVES OCCASIONALLY WHEN NOT USING JUICE PLUS PILLS    Pneumonia    years ago   Premature atrial contractions    followed by DR Wynonia Lawman     Past Surgical History:  Procedure Laterality Date   [place] DECOMP/DISCECTOMY FUSION  12/11/2011   Procedure: ANTERIOR CERVICAL DECOMPRESSION/DISCECTOMY FUSION 2 LEVELS;  Surgeon: Earleen Newport;  Location: [place] [facility];  Service: Neurosurgery;  Laterality: N/A;  C5-6 C6-7 Anterior cervical decompression/diskectomy fusion   ANTERIOR FUSION CERVICAL SPINE  2000   COLONOSCOPY     every 5 years for FH colon polyps   CORNEAL TRANSPLANT     ESOPHAGEAL MANOMETRY N/A 05/25/2016   Procedure: ESOPHAGEAL MANOMETRY  (EM);  Surgeon: Manus Gunning, MD;  [facility]: [facility];  Service: Gastroenterology;  Laterality: N/A;   EYE SURGERY     x 5 right eye   INCONTINENCE SURGERY     KNEE ARTHROSCOPY Right 2015   KNEE ARTHROSCOPY W/ LATERAL RELEASE Right    KNEE ARTHROSCOPY W/ MENISCAL REPAIR Left    KNEE CLOSED REDUCTION Right 02/10/2018   Procedure: CLOSED MANIPULATION RIGHT KNEE;  Surgeon: Latanya Maudlin, MD;  Location: [facility];  Service: Orthopedics;  Laterality: Right;   NISSEN FUNDOPLICATION  76/73/4193   SCAR DEBRIDEMENT OF TOTAL KNEE Right 06/11/2019   Procedure: Irrigation and debridement excision of scar right total knee arthroplasty, with  poly revision;  Surgeon: Paralee Cancel, MD;  Location: [facility];  Service: Orthopedics;  Laterality: Right;  90 mins   TOTAL KNEE ARTHROPLASTY Right 12/04/2017   Procedure: RIGHT TOTAL KNEE ARTHROPLASTY;  Surgeon: Latanya Maudlin, MD;  Location: [facility];  Service: Orthopedics;  Laterality: Right;    There were no vitals filed for this visit.   Subjective Assessment - 12/29/21 0804     Subjective Had some dizziness the other day, was doing work around the house. Was off and on for several hours. No other changes.    Pertinent History PMH: L ankle fx  2005, R TKA, memory problems, hx of cervical and lumbar surgeries.    Limitations Walking    Patient Stated Goals check how balance is doing.    Currently in Pain? No/denies                     Vestibular Assessment - 12/29/21 0817       Dix-Hallpike Right   Dix-Hallpike Right Symptoms No nystagmus   no sx     Dix-Hallpike Left   Dix-Hallpike Left Symptoms No nystagmus   no sx                          Balance Exercises - 12/29/21 0821       Balance Exercises: Standing   Standing Eyes Closed Foam/compliant surface;4 reps;30 secs;Limitations    Standing Eyes Closed Limitations Hip width, then slighty more narrow 2 x 10 reps head turns, 2 x 10 reps head nods, incr  difficulty with head nods    SLS with Vectors Foam/compliant surface    SLS with Vectors Limitations Standing on air ex forward then forward then cross body taps x8 reps each side, intermittent taps for balance   Rockerboard Anterior/posterior    Rockerboard Limitations Weight shifting with no UE support x10 reps, head turns x10 reps, head nods x10 reps, eyes closed 3 x 20-30 seconds with intermittent taps to  bars for balance   Tandem Gait Forward;3 reps;Limitations    Tandem Gait Limitations On blue foam beam, down and back x3 reps, intermittent UE support    Marching Foam/compliant surface;Limitations    Marching Limitations On air ex, slow marching x10 reps each leg , then performing with head turns, head nods    Other Standing Exercises On blue foam beam: squatting to pick up cone and then putting it back down on floor, going from R>L 5 reps, needing to stabilize to keep balance, but no dizziness                PT Education - 12/29/21 0844     Education Details Scheduling more appts - plan for an additional 2x week for 4 weeks    Person(s) Educated Patient    Methods Explanation    Comprehension Verbalized understanding              PT Short Term Goals - 12/27/21 0810       PT SHORT TERM GOAL #1   Title Pt will be independent with initial HEP in order to build upon functional gains made in therapy. ALL STGS DUE 12/17/21    Baseline pt independent with initial HEP    Time 3    Period Weeks    Status Achieved    Target Date 12/17/21      PT SHORT TERM GOAL #2   Title Pt will decr 5x sit <> stand time to 16 seconds or less without UE support in order to demo improved functional strength/decr fall risk.    Baseline 18.13 seconds; 12.40 seconds on 12/27/21    Time 3    Period Weeks    Status Achieved      PT SHORT TERM GOAL #3   Title Pt will improve FGA score to at least a 23/30 in order to demo decr fall risk.    Baseline 21/30; 24/30 on 12/27/21    Time 3     Period Weeks    Status Achieved  PT Long Term Goals - 12/08/21 1003       PT LONG TERM GOAL #1   Title Pt will be independent with final HEP in order to build upon functional gains made in therapy.  ALL LTGS DUE 01/07/22    Time 6    Period Weeks    Status New    Target Date 01/07/22      PT LONG TERM GOAL #2   Title Pt will improve FGA score to at least a 26/30 in order to demo decr fall risk.    Baseline 21/30    Time 6    Period Weeks    Status New      PT LONG TERM GOAL #3   Title Pt will decr 5x sit <> stand time to 14 seconds or less without UE support in order to demo improved functional strength/decr fall risk.    Baseline 18.13 seconds    Time 6    Period Weeks    Status New      PT LONG TERM GOAL #4   Title Pt will perform anterior and posterior push and release test in 1 robust step in order to demo improved stepping strategies for balance.    Baseline 2 steps.    Time 6    Period Weeks    Status New      PT LONG TERM GOAL #5   Title Pt will improve DVA to at least line 6 in order to demo improved VOR.    Baseline static: line 8, dyanmic: line 4    Time 6    Period Weeks    Status New                   Plan - 12/29/21 1224     Clinical Impression Statement Re-assessed Marye Round as pt had an episode of dizziness the other day, pt still with no nystagmus in position or no symptoms. Just mild symptoms when going from [facility] > sit. Remainder of session focused on balance on unlevel surfaces with SLS, eyes closed, and head motions. Pt more challenged by head nods. Will continue to progress towards LTGs.    Personal Factors and Comorbidities Comorbidity 3+;Past/Current Experience;Time since onset of injury/illness/exacerbation    Comorbidities L ankle fx 2005, R TKA, memory problems, hx of cervical and lumbar surgeries.    Examination-Activity Limitations Locomotion Level    Examination-Participation Restrictions Community  Activity    Stability/Clinical Decision Making Evolving/Moderate complexity    Rehab Potential Good    PT Frequency 2x / week    PT Duration 12 weeks    PT Treatment/Interventions ADLs/Self Care Home Management;Aquatic Therapy;Gait training;Stair training;Functional mobility training;Therapeutic activities;Neuromuscular re-education;Balance training;Therapeutic exercise;Patient/family education;Energy conservation;Vestibular;Canalith Repostioning    PT Next Visit Plan VOR x1 exercises. balance - compliant surfaces, SLS, vestibular input, tandem. functional BLE strength.  SciFit/Nustep for endurance.    Consulted and Agree with Plan of Care Patient             Patient will benefit from skilled therapeutic intervention in order to improve the following deficits and impairments:  Abnormal gait, Decreased balance, Decreased activity tolerance, Decreased strength, Difficulty walking, Postural dysfunction, Decreased coordination  Visit Diagnosis: Unsteadiness on feet  Muscle weakness (generalized)  Dizziness and giddiness  Other abnormalities of gait and mobility     Problem List Patient Active Problem List   Diagnosis Date Noted   Arthrofibrosis of knee joint, right 06/11/2019   Hx of total knee  arthroplasty, right 12/04/2017   Atypical chest pain    Mild cognitive impairment with memory loss 04/29/2013   Cervical spondylosis with radiculopathy 12/11/2011   FECAL OCCULT BLOOD 02/18/2009   ESOPHAGEAL STRICTURE 02/16/2009   GERD 02/16/2009   MENOPAUSE, SURGICAL 02/24/2008   SKIN CANCER, HX OF 02/24/2008   Personal history of other diseases of digestive system 02/24/2008    Arliss Journey, PT, DPT  12/29/2021, 12:25 PM  [facility] [address] [place], [place], [address] Phone: [phone]   Fax:  [phone]  Name: AUBREIGH FUERTE MRN: [id number] Date of Birth: Jun 28, 1951

## 2022-01-02 ENCOUNTER — Ambulatory Visit: Payer: Medicare PPO | Admitting: Physical Therapy

## 2022-01-04 ENCOUNTER — Ambulatory Visit: Payer: Medicare PPO | Attending: Family Medicine | Admitting: Physical Therapy

## 2022-01-04 ENCOUNTER — Encounter: Payer: Self-pay | Admitting: Physical Therapy

## 2022-01-04 ENCOUNTER — Other Ambulatory Visit: Payer: Self-pay

## 2022-01-04 DIAGNOSIS — R2689 Other abnormalities of gait and mobility: Secondary | ICD-10-CM | POA: Insufficient documentation

## 2022-01-04 DIAGNOSIS — R42 Dizziness and giddiness: Secondary | ICD-10-CM | POA: Diagnosis not present

## 2022-01-04 DIAGNOSIS — M6281 Muscle weakness (generalized): Secondary | ICD-10-CM | POA: Insufficient documentation

## 2022-01-04 DIAGNOSIS — R2681 Unsteadiness on feet: Secondary | ICD-10-CM | POA: Insufficient documentation

## 2022-01-04 NOTE — Patient Instructions (Signed)
Access Code: [email or web address] URL: [email or web address] Date: 01/04/2022 Prepared by: Janann August  Exercises Standing Marching - 2 x daily - 5 x weekly - 3 sets Tandem Walking with Counter Support - 2 x daily - 5 x weekly - 3 sets Sit to Stand - 2 x daily - 5 x weekly - 2 sets - 5 reps Romberg Stance Eyes Closed on Foam Pad - 1-2 x daily - 5 x weekly - 3 sets - 30 hold Wide Stance with Eyes Closed on Foam Pad - 1-2 x daily - 5 x weekly - 2 sets - 5 reps Seated Vestibular Gaze Fixation with Head Rotation - 2 x daily - 5 x weekly - 3 sets - 30 hold Seated Gaze Stabilization with Head Nod - 2 x daily - 5 x weekly - 3 sets - 30 hold Brandt-Daroff Vestibular Exercise - 2 x daily - 7 x weekly - 2 sets - 5 reps

## 2022-01-04 NOTE — Therapy (Addendum)
[facility] [address] [place], [place], [address] Phone: [phone]   Fax:  [phone]  Physical Therapy Treatment  Patient Details  Name: [name] Stone MRN: [id number] Date of Birth: 1951-06-11 Referring Provider (PT): Lawerance Cruel, MD   Encounter Date: 01/04/2022   PT End of Session - 01/04/22 0806     Visit Number 9    Number of Visits 13    Date for PT Re-Evaluation 02/20/22    Authorization Type [employer] - Medicare    PT Start Time 0803    PT Stop Time 2952    PT Time Calculation (min) 41 min    Equipment Utilized During Treatment Gait belt    Activity Tolerance Patient tolerated treatment well    Behavior During Therapy [facility] for tasks assessed/performed             Past Medical History:  Diagnosis Date   Ankle fracture, left 2005   Arthritis    Cancer ([place])    basal cell ca removed from arm   Complaints of memory disturbance    evaluated by Dr  Marijean Bravo   Depression    Dysrhythmia    Has PACs   Esophageal stricture    Esophagitis    Family history of adverse reaction to anesthesia    MOTHER ALWAYS HAS N/V WITH SURGERY    GERD (gastroesophageal reflux disease)    History of environmental allergies    GETS HIVES OCCASIONALLY WHEN NOT USING JUICE PLUS PILLS    Pneumonia    years ago   Premature atrial contractions    followed by DR Wynonia Lawman     Past Surgical History:  Procedure Laterality Date   [place] DECOMP/DISCECTOMY FUSION  12/11/2011   Procedure: ANTERIOR CERVICAL DECOMPRESSION/DISCECTOMY FUSION 2 LEVELS;  Surgeon: Earleen Newport;  Location: [place] [facility];  Service: Neurosurgery;  Laterality: N/A;  C5-6 C6-7 Anterior cervical decompression/diskectomy fusion   ANTERIOR FUSION CERVICAL SPINE  2000   COLONOSCOPY     every 5 years for FH colon polyps   CORNEAL TRANSPLANT     ESOPHAGEAL MANOMETRY N/A 05/25/2016   Procedure: ESOPHAGEAL MANOMETRY  (EM);  Surgeon: Manus Gunning, MD;  [facility];  Service: Gastroenterology;  Laterality: N/A;   EYE SURGERY     x 5 right eye   INCONTINENCE SURGERY     KNEE ARTHROSCOPY Right 2015   KNEE ARTHROSCOPY W/ LATERAL RELEASE Right    KNEE ARTHROSCOPY W/ MENISCAL REPAIR Left    KNEE CLOSED REDUCTION Right 02/10/2018   Procedure: CLOSED MANIPULATION RIGHT KNEE;  Surgeon: Latanya Maudlin, MD;  Location: [facility];  Service: Orthopedics;  Laterality: Right;   NISSEN FUNDOPLICATION  84/13/2440   SCAR DEBRIDEMENT OF TOTAL KNEE Right 06/11/2019   Procedure: Irrigation and debridement excision of scar right total knee arthroplasty, with  poly revision;  Surgeon: Paralee Cancel, MD;  Location: [facility];  Service: Orthopedics;  Laterality: Right;  90 mins   TOTAL KNEE ARTHROPLASTY Right 12/04/2017   Procedure: RIGHT TOTAL KNEE ARTHROPLASTY;  Surgeon: Latanya Maudlin, MD;  Location: [facility];  Service: Orthopedics;  Laterality: Right;    There were no vitals filed for this visit.   Subjective Assessment - 01/04/22 0806     Subjective On Saturday, had an episode of feeling unsteady and had to hold onto walls for balance, was not a spinning sensation. Took some meclizine and it helped some. Was even worse  the next day.    Pertinent History PMH: L ankle fx 2005, R TKA, memory problems, hx of cervical and lumbar surgeries.    Limitations Walking    Patient Stated Goals check how balance is doing.                     Vestibular Assessment - 01/04/22 1254       Dix-Hallpike Right   Dix-Hallpike Right Symptoms No nystagmus      Dix-Hallpike Left   Dix-Hallpike Left Symptoms No nystagmus      Sidelying Right   Sidelying Right Duration Pt reporting head feels heavy, but worse on L, incr dizziness/unsteadiness when coming up from sidelying position    Sidelying Right Symptoms No nystagmus      Sidelying Left   Sidelying Left Duration Pt reporting head feels heavy,  incr  dizziness/unsteadiness when coming up from sidelying position    Sidelying Left Symptoms No nystagmus                       Vestibular Treatment/Exercise - 01/04/22 1255       Vestibular Treatment/Exercise   Canalith Repositioning Epley Manuever Left    Habituation Exercises Brandt Daroff       EPLEY MANUEVER LEFT   Number of Reps  1    Overall Response  Improved Symptoms     RESPONSE DETAILS LEFT Performed L Epley after pt with incr symptoms with L sidelying test, but no nystagmus noted. Pt only with dizziness when in position 2 that subsided quickly, when coming up from R sidelying to sit, pt reporting that this felt better this time compared to when first assessed with sidelying test.      Nestor Lewandowsky   Number of Reps  5    Symptom Description  5 reps to L and then 5 reps to R, pt with improved sx after incr reps, added to pt's HEP                    PT Education - 01/04/22 1134     Education Details Habituation exercises to pt's HEP, education on resolution of BPPV and how sx are likely due to residual motion sensitivity and from vestibular hypofunction    Person(s) Educated Patient    Methods Explanation;Demonstration;Handout    Comprehension Verbalized understanding;Returned demonstration              PT Short Term Goals - 12/27/21 0810       PT SHORT TERM GOAL #1   Title Pt will be independent with initial HEP in order to build upon functional gains made in therapy. ALL STGS DUE 12/17/21    Baseline pt independent with initial HEP    Time 3    Period Weeks    Status Achieved    Target Date 12/17/21      PT SHORT TERM GOAL #2   Title Pt will decr 5x sit <> stand time to 16 seconds or less without UE support in order to demo improved functional strength/decr fall risk.    Baseline 18.13 seconds; 12.40 seconds on 12/27/21    Time 3    Period Weeks    Status Achieved      PT SHORT TERM GOAL #3   Title Pt will improve FGA score to at  least a 23/30 in order to demo decr fall risk.    Baseline 21/30; 24/30 on 12/27/21    Time  3    Period Weeks    Status Achieved               PT Long Term Goals - 12/08/21 1003       PT LONG TERM GOAL #1   Title Pt will be independent with final HEP in order to build upon functional gains made in therapy.  ALL LTGS DUE 01/07/22    Time 6    Period Weeks    Status New    Target Date 01/07/22      PT LONG TERM GOAL #2   Title Pt will improve FGA score to at least a 26/30 in order to demo decr fall risk.    Baseline 21/30    Time 6    Period Weeks    Status New      PT LONG TERM GOAL #3   Title Pt will decr 5x sit <> stand time to 14 seconds or less without UE support in order to demo improved functional strength/decr fall risk.    Baseline 18.13 seconds    Time 6    Period Weeks    Status New      PT LONG TERM GOAL #4   Title Pt will perform anterior and posterior push and release test in 1 robust step in order to demo improved stepping strategies for balance.    Baseline 2 steps.    Time 6    Period Weeks    Status New      PT LONG TERM GOAL #5   Title Pt will improve DVA to at least line 6 in order to demo improved VOR.    Baseline static: line 8, dyanmic: line 4    Time 6    Period Weeks    Status New                   Plan - 01/04/22 1257     Clinical Impression Statement Pt with an episode of dizziness/unsteadiness over the weekend that lasted 3 days and pt having to take meclizine. Re-assessed for BPPV to see if it had returned. No dizziness/nystagmus with Marye Round, but pt did have incr sx with L>R sidelying test with no nystagmus noted. Did try L Epley to treat L side. Afterwards, tried [employer] (x5 reps to L first and then x5 reps to R) with pt with improvement in sx with incr reps. Added to HEP.  Determined that sx must be due to a motion sensitivity/vestibular hypofunction after clearing canals and pt having a longer hx of  dizziness/unsteadiness. Will continue to progress towards LTGs.    Personal Factors and Comorbidities Comorbidity 3+;Past/Current Experience;Time since onset of injury/illness/exacerbation    Comorbidities L ankle fx 2005, R TKA, memory problems, hx of cervical and lumbar surgeries.    Examination-Activity Limitations Locomotion Level    Examination-Participation Restrictions Community Activity    Stability/Clinical Decision Making Evolving/Moderate complexity    Rehab Potential Good    PT Frequency 2x / week    PT Duration 12 weeks    PT Treatment/Interventions ADLs/Self Care Home Management;Aquatic Therapy;Gait training;Stair training;Functional mobility training;Therapeutic activities;Neuromuscular re-education;Balance training;Therapeutic exercise;Patient/family education;Energy conservation;Vestibular;Canalith Repostioning    PT Next Visit Plan 10th visit progress note. habituation exercises to rolling/ sidelying/ and turnsVOR x1 exercises. balance - compliant surfaces, SLS, vestibular input, tandem. functional BLE strength.  SciFit/Nustep for endurance.    Consulted and Agree with Plan of Care Patient  Patient will benefit from skilled therapeutic intervention in order to improve the following deficits and impairments:  Abnormal gait, Decreased balance, Decreased activity tolerance, Decreased strength, Difficulty walking, Postural dysfunction, Decreased coordination  Visit Diagnosis: Unsteadiness on feet  Muscle weakness (generalized)  Dizziness and giddiness  Other abnormalities of gait and mobility     Problem List Patient Active Problem List   Diagnosis Date Noted   Arthrofibrosis of knee joint, right 06/11/2019   Hx of total knee arthroplasty, right 12/04/2017   Atypical chest pain    Mild cognitive impairment with memory loss 04/29/2013   Cervical spondylosis with radiculopathy 12/11/2011   FECAL OCCULT BLOOD 02/18/2009   ESOPHAGEAL STRICTURE 02/16/2009    GERD 02/16/2009   MENOPAUSE, SURGICAL 02/24/2008   SKIN CANCER, HX OF 02/24/2008   Personal history of other diseases of digestive system 02/24/2008    Arliss Journey, PT, DPT  01/04/2022, 1:01 PM  [facility] [address] [place], [place], [address] Phone: [phone]   Fax:  [phone]  Name: [name] Stone MRN: [id number] Date of Birth: 1951-02-27

## 2022-01-09 DIAGNOSIS — R059 Cough, unspecified: Secondary | ICD-10-CM | POA: Diagnosis not present

## 2022-01-09 DIAGNOSIS — J069 Acute upper respiratory infection, unspecified: Secondary | ICD-10-CM | POA: Diagnosis not present

## 2022-01-10 ENCOUNTER — Other Ambulatory Visit: Payer: Self-pay

## 2022-01-10 ENCOUNTER — Encounter: Payer: Self-pay | Admitting: Physical Therapy

## 2022-01-10 ENCOUNTER — Ambulatory Visit: Payer: Medicare PPO | Admitting: Physical Therapy

## 2022-01-10 DIAGNOSIS — M6281 Muscle weakness (generalized): Secondary | ICD-10-CM | POA: Diagnosis not present

## 2022-01-10 DIAGNOSIS — R42 Dizziness and giddiness: Secondary | ICD-10-CM | POA: Diagnosis not present

## 2022-01-10 DIAGNOSIS — R2689 Other abnormalities of gait and mobility: Secondary | ICD-10-CM

## 2022-01-10 DIAGNOSIS — R2681 Unsteadiness on feet: Secondary | ICD-10-CM

## 2022-01-10 NOTE — Therapy (Signed)
[facility] [address] [place], [place], [address] Phone: [phone]   Fax:  [phone]  Physical Therapy Treatment/10th Visit Progress Note  Patient Details  Name: [name] Stone MRN: [id number] Date of Birth: 08/03/1951 Referring Provider (PT): Lawerance Cruel, MD  10th Visit Physical Therapy Progress Note  Dates of Reporting Period: 11/22/21 to 01/10/22   Encounter Date: 01/10/2022   PT End of Session - 01/10/22 0806     Visit Number 10    Number of Visits 13    Date for PT Re-Evaluation 02/20/22    Authorization Type [employer] - Medicare    PT Start Time 0805    PT Stop Time 0845    PT Time Calculation (min) 40 min    Equipment Utilized During Treatment Gait belt    Activity Tolerance Patient tolerated treatment well    Behavior During Therapy [facility] for tasks assessed/performed             Past Medical History:  Diagnosis Date   Ankle fracture, left 2005   Arthritis    Cancer ([place])    basal cell ca removed from arm   Complaints of memory disturbance    evaluated by Dr  Marijean Bravo   Depression    Dysrhythmia    Has PACs   Esophageal stricture    Esophagitis    Family history of adverse reaction to anesthesia    MOTHER ALWAYS HAS N/V WITH SURGERY    GERD (gastroesophageal reflux disease)    History of environmental allergies    GETS HIVES OCCASIONALLY WHEN NOT USING JUICE PLUS PILLS    Pneumonia    years ago   Premature atrial contractions    followed by DR Wynonia Lawman     Past Surgical History:  Procedure Laterality Date   [place] DECOMP/DISCECTOMY FUSION  12/11/2011   Procedure: ANTERIOR CERVICAL DECOMPRESSION/DISCECTOMY FUSION 2 LEVELS;  Surgeon: Earleen Newport;  Location: [facility];  Service: Neurosurgery;  Laterality: N/A;  C5-6 C6-7 Anterior cervical decompression/diskectomy fusion   ANTERIOR FUSION CERVICAL SPINE  2000   COLONOSCOPY     every 5  years for FH colon polyps   CORNEAL TRANSPLANT     ESOPHAGEAL MANOMETRY N/A 05/25/2016   Procedure: ESOPHAGEAL MANOMETRY (EM);  Surgeon: Manus Gunning, MD;  [facility]: [facility];  Service: Gastroenterology;  Laterality: N/A;   EYE SURGERY     x 5 right eye   INCONTINENCE SURGERY     KNEE ARTHROSCOPY Right 2015   KNEE ARTHROSCOPY W/ LATERAL RELEASE Right    KNEE ARTHROSCOPY W/ MENISCAL REPAIR Left    KNEE CLOSED REDUCTION Right 02/10/2018   Procedure: CLOSED MANIPULATION RIGHT KNEE;  Surgeon: Latanya Maudlin, MD;  Location: [facility];  Service: Orthopedics;  Laterality: Right;   NISSEN FUNDOPLICATION  93/23/5573   SCAR DEBRIDEMENT OF TOTAL KNEE Right 06/11/2019   Procedure: Irrigation and debridement excision of scar right total knee arthroplasty, with  poly revision;  Surgeon: Paralee Cancel, MD;  Location: [facility];  Service: Orthopedics;  Laterality: Right;  90 mins   TOTAL KNEE ARTHROPLASTY Right 12/04/2017   Procedure: RIGHT TOTAL KNEE ARTHROPLASTY;  Surgeon: Latanya Maudlin, MD;  Location: [facility];  Service: Orthopedics;  Laterality: Right;    There were no vitals filed for this visit.   Subjective Assessment - 01/10/22 0807     Subjective Pt reports it has been uneventful since she was last here. No episodes  of dizziness. Exercises are going well.    Pertinent History PMH: L ankle fx 2005, R TKA, memory problems, hx of cervical and lumbar surgeries.    Limitations Walking    Patient Stated Goals check how balance is doing.    Currently in Pain? No/denies                [facility] PT Assessment - 01/10/22 0809       High Level Balance   High Level Balance Comments push and release test: 1 step anterior, 2 steps posterior      Functional Gait  Assessment   Gait assessed  Yes    Gait Level Surface Walks 20 ft in less than 5.5 sec, no assistive devices, good speed, no evidence for imbalance, normal gait pattern, deviates no more than 6 in outside of the 12 in walkway width.     Change in Gait Speed Able to smoothly change walking speed without loss of balance or gait deviation. Deviate no more than 6 in outside of the 12 in walkway width.    Gait with Horizontal Head Turns Performs head turns smoothly with slight change in gait velocity (eg, minor disruption to smooth gait path), deviates 6-10 in outside 12 in walkway width, or uses an assistive device.    Gait with Vertical Head Turns Performs head turns with no change in gait. Deviates no more than 6 in outside 12 in walkway width.    Gait and Pivot Turn Pivot turns safely within 3 sec and stops quickly with no loss of balance.    Step Over Obstacle Is able to step over 2 stacked shoe boxes taped together (9 in total height) without changing gait speed. No evidence of imbalance.    Gait with Narrow Base of Support Ambulates 7-9 steps.    Gait with Eyes Closed Walks 20 ft, uses assistive device, slower speed, mild gait deviations, deviates 6-10 in outside 12 in walkway width. Ambulates 20 ft in less than 9 sec but greater than 7 sec.   8.19   Ambulating Backwards Walks 20 ft, uses assistive device, slower speed, mild gait deviations, deviates 6-10 in outside 12 in walkway width.   12.84   Steps Alternating feet, no rail.    Total Score 26    FGA comment: low fall risk                               Conditions: 1: 2 trials WNL 2: first 2 trials below normal, last trial WNL 3:  first 2 trials WNL, last trial below normal 4: 2 trials below normal limits 5: first trial fall, 2 remaining trials WNL 6: all 3 trials WNL Composite score: 70 (above normal) Sensory Analysis Som: WNL Vis: Below normal Vest: Below normal Pref: WNL Strategy analysis: equal hip/ankle strategy COG alignment: Pt with midline COG but more posteriorly        PT Short Term Goals - 12/27/21 0810       PT SHORT TERM GOAL #1   Title Pt will be independent with initial HEP in order to build upon functional gains  made in therapy. ALL STGS DUE 12/17/21    Baseline pt independent with initial HEP    Time 3    Period Weeks    Status Achieved    Target Date 12/17/21      PT SHORT TERM GOAL #2   Title Pt will decr 5x  sit <> stand time to 16 seconds or less without UE support in order to demo improved functional strength/decr fall risk.    Baseline 18.13 seconds; 12.40 seconds on 12/27/21    Time 3    Period Weeks    Status Achieved      PT SHORT TERM GOAL #3   Title Pt will improve FGA score to at least a 23/30 in order to demo decr fall risk.    Baseline 21/30; 24/30 on 12/27/21    Time 3    Period Weeks    Status Achieved               PT Long Term Goals - 01/10/22 8938       PT LONG TERM GOAL #1   Title Pt will be independent with final HEP in order to build upon functional gains made in therapy.  ALL LTGS DUE 01/07/22    Baseline pt reports doing her HEP at home, will benefit from continued review/additions    Time 6    Period Weeks    Status On-going    Target Date 01/07/22      PT LONG TERM GOAL #2   Title Pt will improve FGA score to at least a 26/30 in order to demo decr fall risk.    Baseline 21/30; 26/30 on 01/10/22    Time 6    Period Weeks    Status Achieved      PT LONG TERM GOAL #3   Title Pt will decr 5x sit <> stand time to 14 seconds or less without UE support in order to demo improved functional strength/decr fall risk.    Baseline 18.13 seconds; 12.40 seconds on 12/27/21    Time 6    Period Weeks    Status Achieved      PT LONG TERM GOAL #4   Title Pt will perform anterior and posterior push and release test in 1 robust step in order to demo improved stepping strategies for balance.    Baseline 2 steps, 1 step anterior, 2 steps posterior    Time 6    Period Weeks    Status Partially Met      PT LONG TERM GOAL #5   Title Pt will improve DVA to at least line 6 in order to demo improved VOR.    Baseline static: line 8, dyanmic: line 4    Time 6    Period  Weeks    Status On-going            Ongoing/updated LTGs:     PT Long Term Goals - 01/10/22 1017       PT LONG TERM GOAL #1   Title Pt will be independent with final HEP in order to build upon functional gains made in therapy.  ALL LTGS 02/07/22    Baseline pt reports doing her HEP at home, will benefit from continued review/additions    Time 10    Period Weeks    Status On-going    Target Date 02/07/22      PT LONG TERM GOAL #2   Title Pt will improve FGA score to at least a 28/30 in order to demo decr fall risk.    Baseline 21/30; 26/30 on 01/10/22    Time 10    Period Weeks    Status Revised      PT LONG TERM GOAL #3   Title Pt will score WNL for vision and vestibular under sensory analysis  on the SOT.    Baseline below normal vision and vestibular, WNL somatosensory    Time 10    Period Weeks    Status New      PT LONG TERM GOAL #5   Title Pt will improve DVA to at least line 6 in order to demo improved VOR.    Baseline static: line 8, dyanmic: line 4    Time 10    Period Weeks    Status On-going                 Plan - 01/10/22 0919     Clinical Impression Statement 10th visit PN: Pt has met LTGs #1 - 3. Pt has been performing her HEP at home but would benefit from further updates/review. Pt improved FGA score to a 26/30 (previously 21/30), putting pt at a decr risk for falls. Pt improved stepping strategy in the anterior direction to one step but 2 steps in the posterior direction and partially met LTG #4. Assessed the SOT today (see above for further details) with pt scoring WNL for composite score but below normal limits for sensory analysis for vision and vestibular symptoms. Pt is making good progress with PT, but still has balance deficits, motion sensitivity and vestibular hypofunction. Will updated LTGs as appropriate in POC.    Personal Factors and Comorbidities Comorbidity 3+;Past/Current Experience;Time since onset of injury/illness/exacerbation     Comorbidities L ankle fx 2005, R TKA, memory problems, hx of cervical and lumbar surgeries.    Examination-Activity Limitations Locomotion Level    Examination-Participation Restrictions Community Activity    Stability/Clinical Decision Making Evolving/Moderate complexity    Rehab Potential Good    PT Frequency 2x / week    PT Duration 12 weeks    PT Treatment/Interventions ADLs/Self Care Home Management;Aquatic Therapy;Gait training;Stair training;Functional mobility training;Therapeutic activities;Neuromuscular re-education;Balance training;Therapeutic exercise;Patient/family education;Energy conservation;Vestibular;Canalith Repostioning    PT Next Visit Plan habituation exercises to rolling/ sidelying/ and turns. VOR x1 exercise in standing.. balance - compliant surfaces, SLS, vestibular input, tandem. functional BLE strength.  SciFit/Nustep for endurance.    Consulted and Agree with Plan of Care Patient             Patient will benefit from skilled therapeutic intervention in order to improve the following deficits and impairments:  Abnormal gait, Decreased balance, Decreased activity tolerance, Decreased strength, Difficulty walking, Postural dysfunction, Decreased coordination  Visit Diagnosis: Unsteadiness on feet  Muscle weakness (generalized)  Dizziness and giddiness  Other abnormalities of gait and mobility     Problem List Patient Active Problem List   Diagnosis Date Noted   Arthrofibrosis of knee joint, right 06/11/2019   Hx of total knee arthroplasty, right 12/04/2017   Atypical chest pain    Mild cognitive impairment with memory loss 04/29/2013   Cervical spondylosis with radiculopathy 12/11/2011   FECAL OCCULT BLOOD 02/18/2009   ESOPHAGEAL STRICTURE 02/16/2009   GERD 02/16/2009   MENOPAUSE, SURGICAL 02/24/2008   SKIN CANCER, HX OF 02/24/2008   Personal history of other diseases of digestive system 02/24/2008    Arliss Journey, PT, DPT 01/10/2022, 9:23  AM  [facility] [address] [place], [place], [address] Phone: [phone]   Fax:  [phone]  Name: ADREENA WILLITS MRN: [id number] Date of Birth: Jun 28, 1951

## 2022-01-12 ENCOUNTER — Encounter: Payer: Self-pay | Admitting: Physical Therapy

## 2022-01-12 ENCOUNTER — Other Ambulatory Visit: Payer: Self-pay

## 2022-01-12 ENCOUNTER — Ambulatory Visit: Payer: Medicare PPO | Admitting: Physical Therapy

## 2022-01-12 DIAGNOSIS — R2681 Unsteadiness on feet: Secondary | ICD-10-CM

## 2022-01-12 DIAGNOSIS — M6281 Muscle weakness (generalized): Secondary | ICD-10-CM

## 2022-01-12 DIAGNOSIS — R42 Dizziness and giddiness: Secondary | ICD-10-CM | POA: Diagnosis not present

## 2022-01-12 DIAGNOSIS — R2689 Other abnormalities of gait and mobility: Secondary | ICD-10-CM | POA: Diagnosis not present

## 2022-01-12 NOTE — Therapy (Signed)
[facility] [address] [place], [place], [address] Phone: [phone]   Fax:  [phone]  Physical Therapy Treatment  Patient Details  Name: [name] Stone MRN: [id number] Date of Birth: 1951-07-22 Referring Provider (PT): Lawerance Cruel, MD   Encounter Date: 01/12/2022   PT End of Session - 01/12/22 0850     Visit Number 11    Number of Visits 13    Date for PT Re-Evaluation 02/20/22    Authorization Type [employer] - Medicare    Authorization Time Period 12 visits from 11/22/21 - 02/10/22    PT Start Time 0850    PT Stop Time 0929    PT Time Calculation (min) 39 min    Equipment Utilized During Treatment Gait belt    Activity Tolerance Patient tolerated treatment well    Behavior During Therapy [facility] for tasks assessed/performed             Past Medical History:  Diagnosis Date   Ankle fracture, left 2005   Arthritis    Cancer ([place])    basal cell ca removed from arm   Complaints of memory disturbance    evaluated by Dr  Marijean Bravo   Depression    Dysrhythmia    Has PACs   Esophageal stricture    Esophagitis    Family history of adverse reaction to anesthesia    MOTHER ALWAYS HAS N/V WITH SURGERY    GERD (gastroesophageal reflux disease)    History of environmental allergies    GETS HIVES OCCASIONALLY WHEN NOT USING JUICE PLUS PILLS    Pneumonia    years ago   Premature atrial contractions    followed by DR Wynonia Lawman     Past Surgical History:  Procedure Laterality Date   [place] DECOMP/DISCECTOMY FUSION  12/11/2011   Procedure: ANTERIOR CERVICAL DECOMPRESSION/DISCECTOMY FUSION 2 LEVELS;  Surgeon: Earleen Newport;  Location: [facility];  Service: Neurosurgery;  Laterality: N/A;  C5-6 C6-7 Anterior cervical decompression/diskectomy fusion   ANTERIOR FUSION CERVICAL SPINE  2000   COLONOSCOPY     every 5 years for FH colon polyps   CORNEAL TRANSPLANT      ESOPHAGEAL MANOMETRY N/A 05/25/2016   Procedure: ESOPHAGEAL MANOMETRY (EM);  Surgeon: Manus Gunning, MD;  [facility]: [facility];  Service: Gastroenterology;  Laterality: N/A;   EYE SURGERY     x 5 right eye   INCONTINENCE SURGERY     KNEE ARTHROSCOPY Right 2015   KNEE ARTHROSCOPY W/ LATERAL RELEASE Right    KNEE ARTHROSCOPY W/ MENISCAL REPAIR Left    KNEE CLOSED REDUCTION Right 02/10/2018   Procedure: CLOSED MANIPULATION RIGHT KNEE;  Surgeon: Latanya Maudlin, MD;  [facility];  Service: Orthopedics;  Laterality: Right;   NISSEN FUNDOPLICATION  76/19/5093   SCAR DEBRIDEMENT OF TOTAL KNEE Right 06/11/2019   Procedure: Irrigation and debridement excision of scar right total knee arthroplasty, with  poly revision;  Surgeon: Paralee Cancel, MD;  [facility]: [facility];  Service: Orthopedics;  Laterality: Right;  90 mins   TOTAL KNEE ARTHROPLASTY Right 12/04/2017   Procedure: RIGHT TOTAL KNEE ARTHROPLASTY;  Surgeon: Latanya Maudlin, MD;  [facility];  Service: Orthopedics;  Laterality: Right;    There were no vitals filed for this visit.   Subjective Assessment - 01/12/22 0851     Subjective Had a busy day yesterday. No new episodes of dizziness.    Pertinent History PMH: L ankle fx  2005, R TKA, memory problems, hx of cervical and lumbar surgeries.    Limitations Walking    Patient Stated Goals check how balance is doing.    Currently in Pain? No/denies                               [facility] Adult PT Treatment/Exercise - 01/12/22 0853       Knee/Hip Exercises: Aerobic   Stepper Seated SciFit for strengthening, endurance at gear 2.8 for 8 minutes with BLE/BUE                 Balance Exercises - 01/12/22 0903       Balance Exercises: Standing   Standing Eyes Closed Foam/compliant surface;4 reps;30 secs;Limitations    Standing Eyes Closed Limitations Hip width, then slighty more narrow 2 x 10 reps head turns, 2 x 10 reps head nods, incr difficulty  with head nods    Tandem Stance Eyes open;Foam/compliant surface;2 reps;15 secs   15-20 seconds, performed with both legs, intermittent UE support   Rockerboard Anterior/posterior    Rockerboard Limitations diagonal head turns each direction 2 x 10 reps - cues for slowed pace. Eyes closed keeping board still 2 x 30 seconds, with rocking board 2 x 30 seconds. Needing intermittent UE support   Tandem Gait Forward;3 reps;Limitations    Tandem Gait Limitations On blue foam beam, down and back x3 reps, intermittent UE support    Sidestepping 4 reps;Limitations    Sidestepping Limitations On blue foam beam - x2 reps focusing on incr foot clearance and slow SLS, x2 reps down and back with adding in head motions to R/L, incr difficulty with side stepping to R    Other Standing Exercises On blue foam beam: x5 reps mini squats with eyes open, then x5 reps with eyes closed with intermittent taps to bars for balance                  PT Short Term Goals - 12/27/21 0810       PT SHORT TERM GOAL #1   Title Pt will be independent with initial HEP in order to build upon functional gains made in therapy. ALL STGS DUE 12/17/21    Baseline pt independent with initial HEP    Time 3    Period Weeks    Status Achieved    Target Date 12/17/21      PT SHORT TERM GOAL #2   Title Pt will decr 5x sit <> stand time to 16 seconds or less without UE support in order to demo improved functional strength/decr fall risk.    Baseline 18.13 seconds; 12.40 seconds on 12/27/21    Time 3    Period Weeks    Status Achieved      PT SHORT TERM GOAL #3   Title Pt will improve FGA score to at least a 23/30 in order to demo decr fall risk.    Baseline 21/30; 24/30 on 12/27/21    Time 3    Period Weeks    Status Achieved               PT Long Term Goals - 01/10/22 6734       PT LONG TERM GOAL #1   Title Pt will be independent with final HEP in order to build upon functional gains made in therapy.  ALL LTGS  02/07/22    Baseline pt reports doing her HEP  at home, will benefit from continued review/additions    Time 10    Period Weeks    Status On-going    Target Date 02/07/22      PT LONG TERM GOAL #2   Title Pt will improve FGA score to at least a 28/30 in order to demo decr fall risk.    Baseline 21/30; 26/30 on 01/10/22    Time 10    Period Weeks    Status Revised      PT LONG TERM GOAL #3   Title Pt will score WNL for vision and vestibular under sensory analysis on the SOT.    Baseline below normal vision and vestibular, WNL somatosensory    Time 10    Period Weeks    Status New      PT LONG TERM GOAL #5   Title Pt will improve DVA to at least line 6 in order to demo improved VOR.    Baseline static: line 8, dyanmic: line 4    Time 10    Period Weeks    Status On-going                   Plan - 01/12/22 0930     Clinical Impression Statement Today's skilled session focused on BLE strengthening and balance on compliant surfaces esp with incr vestibular input with eyes closed and head motions. Pt tolerated session well. Pt challenged by tandem stance on compliant surfaces and diagonal head motions. Will continue to progress towards LTGs.    Personal Factors and Comorbidities Comorbidity 3+;Past/Current Experience;Time since onset of injury/illness/exacerbation    Comorbidities L ankle fx 2005, R TKA, memory problems, hx of cervical and lumbar surgeries.    Examination-Activity Limitations Locomotion Level    Examination-Participation Restrictions Community Activity    Stability/Clinical Decision Making Evolving/Moderate complexity    Rehab Potential Good    PT Frequency 2x / week    PT Duration 12 weeks    PT Treatment/Interventions ADLs/Self Care Home Management;Aquatic Therapy;Gait training;Stair training;Functional mobility training;Therapeutic activities;Neuromuscular re-education;Balance training;Therapeutic exercise;Patient/family education;Energy  conservation;Vestibular;Canalith Repostioning    PT Next Visit Plan continue habituation exercises to rolling/ sidelying/ and turns. VOR x1 exercise in standing.. balance - compliant surfaces, SLS, vestibular input, tandem. functional BLE strength.  SciFit/Nustep for endurance.    Consulted and Agree with Plan of Care Patient             Patient will benefit from skilled therapeutic intervention in order to improve the following deficits and impairments:  Abnormal gait, Decreased balance, Decreased activity tolerance, Decreased strength, Difficulty walking, Postural dysfunction, Decreased coordination  Visit Diagnosis: Muscle weakness (generalized)  Unsteadiness on feet  Dizziness and giddiness     Problem List Patient Active Problem List   Diagnosis Date Noted   Arthrofibrosis of knee joint, right 06/11/2019   Hx of total knee arthroplasty, right 12/04/2017   Atypical chest pain    Mild cognitive impairment with memory loss 04/29/2013   Cervical spondylosis with radiculopathy 12/11/2011   FECAL OCCULT BLOOD 02/18/2009   ESOPHAGEAL STRICTURE 02/16/2009   GERD 02/16/2009   MENOPAUSE, SURGICAL 02/24/2008   SKIN CANCER, HX OF 02/24/2008   Personal history of other diseases of digestive system 02/24/2008    Arliss Journey, PT, DPT  01/12/2022, 9:30 AM  [facility] [address] [place], [place], [address] Phone: [phone]   Fax:  [phone]  Name: [name] Stone MRN: [id number] Date of Birth: 27-Jul-1951

## 2022-01-15 ENCOUNTER — Ambulatory Visit: Payer: Medicare PPO | Admitting: Physical Therapy

## 2022-01-15 ENCOUNTER — Encounter: Payer: Self-pay | Admitting: Physical Therapy

## 2022-01-15 ENCOUNTER — Other Ambulatory Visit: Payer: Self-pay

## 2022-01-15 DIAGNOSIS — R2681 Unsteadiness on feet: Secondary | ICD-10-CM | POA: Diagnosis not present

## 2022-01-15 DIAGNOSIS — M6281 Muscle weakness (generalized): Secondary | ICD-10-CM | POA: Diagnosis not present

## 2022-01-15 DIAGNOSIS — R42 Dizziness and giddiness: Secondary | ICD-10-CM

## 2022-01-15 DIAGNOSIS — R2689 Other abnormalities of gait and mobility: Secondary | ICD-10-CM | POA: Diagnosis not present

## 2022-01-15 NOTE — Patient Instructions (Signed)
Gaze Stabilization: Standing Feet Together (Compliant Surface)    Feet together on pillow, keeping eyes on target on wall __3-4__ feet away, tilt head down 15-30 and move head side to side for __30__ seconds. Repeat while moving head up and down for __30__ seconds. Do 3 sets of each Do _2___ sessions per day. Repeat using target on pattern background.  Copyright  VHI. All rights reserved.     Gaze Stabilization: Tip Card  1.Target must remain in focus, not blurry, and appear stationary while head is in motion. 2.Perform exercises with small head movements (45 to either side of midline). 3.Increase speed of head motion so long as target is in focus. 4.If you wear eyeglasses, be sure you can see target through lens (therapist will give specific instructions for bifocal / progressive lenses). 5.These exercises may provoke dizziness or nausea. Work through these symptoms. If too dizzy, slow head movement slightly. Rest between each exercise. 6.Exercises demand concentration; avoid distractions. 7.For safety, perform standing exercises close to a counter, wall, corner, or next to someone.  Copyright  VHI. All rights reserved.

## 2022-01-15 NOTE — Therapy (Signed)
[facility] [address] [place], [place], [address] Phone: [phone]   Fax:  [phone]  Physical Therapy Treatment  Patient Details  Name: [name] Stone MRN: [id number] Date of Birth: Jul 06, 1951 Referring Provider (PT): Lawerance Cruel, MD   Encounter Date: 01/15/2022   PT End of Session - 01/15/22 0937     Visit Number 12    Number of Visits 13    Date for PT Re-Evaluation 02/20/22    Authorization Type [employer] - Medicare    Authorization Time Period 12 visits from 11/22/21 - 02/10/22    PT Start Time 0936    PT Stop Time 1016    PT Time Calculation (min) 40 min    Equipment Utilized During Treatment Gait belt    Activity Tolerance Patient tolerated treatment well    Behavior During Therapy WFL for tasks assessed/performed             Past Medical History:  Diagnosis Date   Ankle fracture, left 2005   Arthritis    Cancer ([place])    basal cell ca removed from arm   Complaints of memory disturbance    evaluated by Dr  Marijean Bravo   Depression    Dysrhythmia    Has PACs   Esophageal stricture    Esophagitis    Family history of adverse reaction to anesthesia    MOTHER ALWAYS HAS N/V WITH SURGERY    GERD (gastroesophageal reflux disease)    History of environmental allergies    GETS HIVES OCCASIONALLY WHEN NOT USING JUICE PLUS PILLS    Pneumonia    years ago   Premature atrial contractions    followed by DR Wynonia Lawman     Past Surgical History:  Procedure Laterality Date   [place] DECOMP/DISCECTOMY FUSION  12/11/2011   Procedure: ANTERIOR CERVICAL DECOMPRESSION/DISCECTOMY FUSION 2 LEVELS;  Surgeon: Earleen Newport;  Location: [facility];  Service: Neurosurgery;  Laterality: N/A;  C5-6 C6-7 Anterior cervical decompression/diskectomy fusion   ANTERIOR FUSION CERVICAL SPINE  2000   COLONOSCOPY     every 5 years for FH colon polyps   CORNEAL TRANSPLANT      ESOPHAGEAL MANOMETRY N/A 05/25/2016   Procedure: ESOPHAGEAL MANOMETRY (EM);  Surgeon: Manus Gunning, MD;  Location: [facility];  Service: Gastroenterology;  Laterality: N/A;   EYE SURGERY     x 5 right eye   INCONTINENCE SURGERY     KNEE ARTHROSCOPY Right 2015   KNEE ARTHROSCOPY W/ LATERAL RELEASE Right    KNEE ARTHROSCOPY W/ MENISCAL REPAIR Left    KNEE CLOSED REDUCTION Right 02/10/2018   Procedure: CLOSED MANIPULATION RIGHT KNEE;  Surgeon: Latanya Maudlin, MD;  Location: [facility];  Service: Orthopedics;  Laterality: Right;   NISSEN FUNDOPLICATION  66/59/9357   SCAR DEBRIDEMENT OF TOTAL KNEE Right 06/11/2019   Procedure: Irrigation and debridement excision of scar right total knee arthroplasty, with  poly revision;  Surgeon: Paralee Cancel, MD;  Location: [facility];  Service: Orthopedics;  Laterality: Right;  90 mins   TOTAL KNEE ARTHROPLASTY Right 12/04/2017   Procedure: RIGHT TOTAL KNEE ARTHROPLASTY;  Surgeon: Latanya Maudlin, MD;  Location: [facility];  Service: Orthopedics;  Laterality: Right;    There were no vitals filed for this visit.   Subjective Assessment - 01/15/22 0938     Subjective No dizziness this past weekend. Has been trying to work on some of her exercises.    Pertinent  History PMH: L ankle fx 2005, R TKA, memory problems, hx of cervical and lumbar surgeries.    Limitations Walking    Patient Stated Goals check how balance is doing.    Currently in Pain? No/denies                                Vestibular Treatment/Exercise - 01/15/22 0941       X1 Viewing Horizontal   Foot Position Standing    Comments x60 seconds level ground, on 2 pillows: x30 seconds with feet apart, 2x30 seconds with feet together. Cues for speed/proper ROM      X1 Viewing Vertical   Foot Position Standing    Comments x60 seconds level ground, on 2 pillows: x30 seconds with feet apart, 2x30 seconds with feet together                Balance Exercises -  01/15/22 1015       Balance Exercises: Standing   Standing Eyes Opened Narrow base of support (BOS);Foam/compliant surface    Standing Eyes Opened Limitations Feet together on air ex; 2x 10 reps diagonal head motions in each direction    Standing Eyes Closed Foam/compliant surface;4 reps;30 secs;Limitations    Standing Eyes Closed Limitations Feet together on air ex 3 x 30 seconds, incr postural sway    SLS with Vectors Foam/compliant surface    SLS with Vectors Limitations On blue mat, working on turns to R and L and then SLS cross body taps to cones, performed x5 reps, incr difficulty by spreading out space between cones    Marching Foam/compliant surface;Dynamic;Limitations    Marching Limitations On blue mat, forwards marching with head turns x3 reps, head nods x3 reps - intermittent taps at countertop for balance    Sit to Stand Foam/compliant surface;Without upper extremity support;Limitations    Sit to Stand Limitations 2 x 3 reps on air ex with eyes closed                PT Education - 01/15/22 1027     Education Details Updated VOR x1 in standing on pillows to HEP    Person(s) Educated Patient    Methods Explanation;Demonstration;Handout    Comprehension Verbalized understanding;Returned demonstration              PT Short Term Goals - 12/27/21 0810       PT SHORT TERM GOAL #1   Title Pt will be independent with initial HEP in order to build upon functional gains made in therapy. ALL STGS DUE 12/17/21    Baseline pt independent with initial HEP    Time 3    Period Weeks    Status Achieved    Target Date 12/17/21      PT SHORT TERM GOAL #2   Title Pt will decr 5x sit <> stand time to 16 seconds or less without UE support in order to demo improved functional strength/decr fall risk.    Baseline 18.13 seconds; 12.40 seconds on 12/27/21    Time 3    Period Weeks    Status Achieved      PT SHORT TERM GOAL #3   Title Pt will improve FGA score to at least a  23/30 in order to demo decr fall risk.    Baseline 21/30; 24/30 on 12/27/21    Time 3    Period Weeks    Status Achieved  PT Long Term Goals - 01/10/22 5993       PT LONG TERM GOAL #1   Title Pt will be independent with final HEP in order to build upon functional gains made in therapy.  ALL LTGS 02/07/22    Baseline pt reports doing her HEP at home, will benefit from continued review/additions    Time 10    Period Weeks    Status On-going    Target Date 02/07/22      PT LONG TERM GOAL #2   Title Pt will improve FGA score to at least a 28/30 in order to demo decr fall risk.    Baseline 21/30; 26/30 on 01/10/22    Time 10    Period Weeks    Status Revised      PT LONG TERM GOAL #3   Title Pt will score WNL for vision and vestibular under sensory analysis on the SOT.    Baseline below normal vision and vestibular, WNL somatosensory    Time 10    Period Weeks    Status New      PT LONG TERM GOAL #5   Title Pt will improve DVA to at least line 6 in order to demo improved VOR.    Baseline static: line 8, dyanmic: line 4    Time 10    Period Weeks    Status On-going                   Plan - 01/15/22 1028     Clinical Impression Statement Today's skilled session focused on VOR x1 exercises in standing on compliant surfaces, vestibular input for balance, and SLS tasks on compliant surfaces. Pt improving with SLS tasks bilat, able to perform cross body cone taps on unlevel surfaces without UE support. Able to progress VOR x1 in standing with more narrow BOS and feet together. Pt challenged by eyes closed on foam with feet together with incr postural sway and intermittent taps to wall for balance. Will continue to progress towards LTGs.    Personal Factors and Comorbidities Comorbidity 3+;Past/Current Experience;Time since onset of injury/illness/exacerbation    Comorbidities L ankle fx 2005, R TKA, memory problems, hx of cervical and lumbar surgeries.     Examination-Activity Limitations Locomotion Level    Examination-Participation Restrictions Community Activity    Stability/Clinical Decision Making Evolving/Moderate complexity    Rehab Potential Good    PT Frequency 2x / week    PT Duration 12 weeks    PT Treatment/Interventions ADLs/Self Care Home Management;Aquatic Therapy;Gait training;Stair training;Functional mobility training;Therapeutic activities;Neuromuscular re-education;Balance training;Therapeutic exercise;Patient/family education;Energy conservation;Vestibular;Canalith Repostioning    PT Next Visit Plan will need to ask for additional [facility] auth. continue habituation exercises to rolling/ sidelying/ and turns. VOR x1 exercise in standing. and with busy background. balance - compliant surfaces, SLS, vestibular input, tandem. functional BLE strength.  SciFit/Nustep for endurance.    Consulted and Agree with Plan of Care Patient             Patient will benefit from skilled therapeutic intervention in order to improve the following deficits and impairments:  Abnormal gait, Decreased balance, Decreased activity tolerance, Decreased strength, Difficulty walking, Postural dysfunction, Decreased coordination  Visit Diagnosis: Unsteadiness on feet  Dizziness and giddiness  Muscle weakness (generalized)     Problem List Patient Active Problem List   Diagnosis Date Noted   Arthrofibrosis of knee joint, right 06/11/2019   Hx of total knee arthroplasty, right 12/04/2017   Atypical chest pain  Mild cognitive impairment with memory loss 04/29/2013   Cervical spondylosis with radiculopathy 12/11/2011   FECAL OCCULT BLOOD 02/18/2009   ESOPHAGEAL STRICTURE 02/16/2009   GERD 02/16/2009   MENOPAUSE, SURGICAL 02/24/2008   SKIN CANCER, HX OF 02/24/2008   Personal history of other diseases of digestive system 02/24/2008    Arliss Journey, PT, DPT  01/15/2022, 10:30 AM  [facility] [address] [place], [place], [address] Phone: [phone]   Fax:  [phone]  Name: [name] Stone MRN: [id number] Date of Birth: Feb 04, 1951

## 2022-01-17 ENCOUNTER — Other Ambulatory Visit: Payer: Self-pay

## 2022-01-17 ENCOUNTER — Encounter: Payer: Self-pay | Admitting: Physical Therapy

## 2022-01-17 ENCOUNTER — Ambulatory Visit: Payer: Medicare PPO | Admitting: Physical Therapy

## 2022-01-17 DIAGNOSIS — R42 Dizziness and giddiness: Secondary | ICD-10-CM

## 2022-01-17 DIAGNOSIS — R2681 Unsteadiness on feet: Secondary | ICD-10-CM

## 2022-01-17 DIAGNOSIS — R2689 Other abnormalities of gait and mobility: Secondary | ICD-10-CM | POA: Diagnosis not present

## 2022-01-17 DIAGNOSIS — M6281 Muscle weakness (generalized): Secondary | ICD-10-CM | POA: Diagnosis not present

## 2022-01-17 NOTE — Therapy (Addendum)
[facility] [address] [place], [place], [address] Phone: [phone]   Fax:  [phone]  Physical Therapy Treatment  Patient Details  Name: [name] Stone MRN: [id number] Date of Birth: August 23, 1951 Referring Provider (PT): Lawerance Cruel, MD   Encounter Date: 01/17/2022   PT End of Session - 01/17/22 0808     Visit Number 13    Number of Visits 18    Date for PT Re-Evaluation 02/20/22    Authorization Type [employer] - Medicare    Authorization Time Period 12 visits from 11/22/21 - 02/10/22    PT Start Time 0807   pt late to session   PT Stop Time 0845    PT Time Calculation (min) 38 min    Equipment Utilized During Treatment Gait belt    Activity Tolerance Patient tolerated treatment well    Behavior During Therapy [facility] for tasks assessed/performed             Past Medical History:  Diagnosis Date   Ankle fracture, left 2005   Arthritis    Cancer ([place])    basal cell ca removed from arm   Complaints of memory disturbance    evaluated by Dr  Marijean Bravo   Depression    Dysrhythmia    Has PACs   Esophageal stricture    Esophagitis    Family history of adverse reaction to anesthesia    MOTHER ALWAYS HAS N/V WITH SURGERY    GERD (gastroesophageal reflux disease)    History of environmental allergies    GETS HIVES OCCASIONALLY WHEN NOT USING JUICE PLUS PILLS    Pneumonia    years ago   Premature atrial contractions    followed by DR Wynonia Lawman     Past Surgical History:  Procedure Laterality Date   [place] DECOMP/DISCECTOMY FUSION  12/11/2011   Procedure: ANTERIOR CERVICAL DECOMPRESSION/DISCECTOMY FUSION 2 LEVELS;  Surgeon: Earleen Newport;  Location: [facility];  Service: Neurosurgery;  Laterality: N/A;  C5-6 C6-7 Anterior cervical decompression/diskectomy fusion   ANTERIOR FUSION CERVICAL SPINE  2000   COLONOSCOPY     every 5 years for FH colon polyps   CORNEAL  TRANSPLANT     ESOPHAGEAL MANOMETRY N/A 05/25/2016   Procedure: ESOPHAGEAL MANOMETRY (EM);  Surgeon: Manus Gunning, MD;  [facility];  Service: Gastroenterology;  Laterality: N/A;   EYE SURGERY     x 5 right eye   INCONTINENCE SURGERY     KNEE ARTHROSCOPY Right 2015   KNEE ARTHROSCOPY W/ LATERAL RELEASE Right    KNEE ARTHROSCOPY W/ MENISCAL REPAIR Left    KNEE CLOSED REDUCTION Right 02/10/2018   Procedure: CLOSED MANIPULATION RIGHT KNEE;  Surgeon: Latanya Maudlin, MD;  [facility];  Service: Orthopedics;  Laterality: Right;   NISSEN FUNDOPLICATION  38/75/6433   SCAR DEBRIDEMENT OF TOTAL KNEE Right 06/11/2019   Procedure: Irrigation and debridement excision of scar right total knee arthroplasty, with  poly revision;  Surgeon: Paralee Cancel, MD;  [facility]: [facility];  Service: Orthopedics;  Laterality: Right;  90 mins   TOTAL KNEE ARTHROPLASTY Right 12/04/2017   Procedure: RIGHT TOTAL KNEE ARTHROPLASTY;  Surgeon: Latanya Maudlin, MD;  [facility];  Service: Orthopedics;  Laterality: Right;    There were no vitals filed for this visit.   Subjective Assessment - 01/17/22 0807     Subjective No changes since last visit.    Pertinent History PMH: L ankle fx  2005, R TKA, memory problems, hx of cervical and lumbar surgeries.    Limitations Walking    Patient Stated Goals check how balance is doing.    Currently in Pain? No/denies                                Vestibular Treatment/Exercise - 01/17/22 0821       X1 Viewing Horizontal   Foot Position Standing with feet apart   Comments In front of busy background; 3 x 30 seconds, mild sx      X1 Viewing Vertical   Foot Position Standing with feet apart   Comments In front of busy background; 3 x 30 seconds                Balance Exercises - 01/17/22 0824       Balance Exercises: Standing   Tandem Stance Eyes open;Foam/compliant surface;Limitations    Tandem Stance Time On blue  mat 2 x 5 reps head turns each side, intermittent taps to bars for balance   SLS with Vectors Foam/compliant surface    SLS with Vectors Limitations On rockerboard in A/P direction: stepping onto board and then forward tap to cone x10 reps each side UE support > none    Rockerboard Lateral;EC;Limitations    Rockerboard Limitations With eyes closed: holding board still 2 x 30 seconds, rocking board side to side 2 x 30 seconds - intermittent taps to bars. 2 x 5 reps head turns, 2 x  5 reps head nods with eyes closed.    Step Ups 6 inch;Limitations    Step Ups Limitations x6 reps each side floating non stance leg for SLS    Marching Foam/compliant surface    Marching Limitations on blue mat 2 x 10 reps slow marching with eyes closed                  PT Short Term Goals - 12/27/21 0810       PT SHORT TERM GOAL #1   Title Pt will be independent with initial HEP in order to build upon functional gains made in therapy. ALL STGS DUE 12/17/21    Baseline pt independent with initial HEP    Time 3    Period Weeks    Status Achieved    Target Date 12/17/21      PT SHORT TERM GOAL #2   Title Pt will decr 5x sit <> stand time to 16 seconds or less without UE support in order to demo improved functional strength/decr fall risk.    Baseline 18.13 seconds; 12.40 seconds on 12/27/21    Time 3    Period Weeks    Status Achieved      PT SHORT TERM GOAL #3   Title Pt will improve FGA score to at least a 23/30 in order to demo decr fall risk.    Baseline 21/30; 24/30 on 12/27/21    Time 3    Period Weeks    Status Achieved               PT Long Term Goals - 01/10/22 2202       PT LONG TERM GOAL #1   Title Pt will be independent with final HEP in order to build upon functional gains made in therapy.  ALL LTGS 02/07/22    Baseline pt reports doing her HEP at home, will benefit from continued review/additions  Time 10    Period Weeks    Status On-going    Target Date 02/07/22       PT LONG TERM GOAL #2   Title Pt will improve FGA score to at least a 28/30 in order to demo decr fall risk.    Baseline 21/30; 26/30 on 01/10/22    Time 10    Period Weeks    Status Revised      PT LONG TERM GOAL #3   Title Pt will score WNL for vision and vestibular under sensory analysis on the SOT.    Baseline below normal vision and vestibular, WNL somatosensory    Time 10    Period Weeks    Status New      PT LONG TERM GOAL #5   Title Pt will improve DVA to at least line 6 in order to demo improved VOR.    Baseline static: line 8, dyanmic: line 4    Time 10    Period Weeks    Status On-going                   Plan - 01/17/22 1136     Clinical Impression Statement Progressed VOR x1 today in front of a busy background today with pt tolerating well, but did report mild sx with head turns that subsided quickly. Remainder of session focused on balance strategies with incr vestibular input and SLS tasks. Pt challenged with eyes closed on rockerboard, but did improve with incr reps. Will continue to progress towards LTGs.    Personal Factors and Comorbidities Comorbidity 3+;Past/Current Experience;Time since onset of injury/illness/exacerbation    Comorbidities L ankle fx 2005, R TKA, memory problems, hx of cervical and lumbar surgeries.    Examination-Activity Limitations Locomotion Level    Examination-Participation Restrictions Community Activity    Stability/Clinical Decision Making Evolving/Moderate complexity    Rehab Potential Good    PT Frequency 2x / week    PT Duration 12 weeks    PT Treatment/Interventions ADLs/Self Care Home Management;Aquatic Therapy;Gait training;Stair training;Functional mobility training;Therapeutic activities;Neuromuscular re-education;Balance training;Therapeutic exercise;Patient/family education;Energy conservation;Vestibular;Canalith Repostioning    PT Next Visit Plan will need to ask for additional [facility] auth. continue VOR x1 exercise  in standing. and with busy background. incr vestibular input for balance, SLS tasks, BLE strength.    Consulted and Agree with Plan of Care Patient             Patient will benefit from skilled therapeutic intervention in order to improve the following deficits and impairments:  Abnormal gait, Decreased balance, Decreased activity tolerance, Decreased strength, Difficulty walking, Postural dysfunction, Decreased coordination  Visit Diagnosis: Unsteadiness on feet  Dizziness and giddiness  Muscle weakness (generalized)  Other abnormalities of gait and mobility     Problem List Patient Active Problem List   Diagnosis Date Noted   Arthrofibrosis of knee joint, right 06/11/2019   Hx of total knee arthroplasty, right 12/04/2017   Atypical chest pain    Mild cognitive impairment with memory loss 04/29/2013   Cervical spondylosis with radiculopathy 12/11/2011   FECAL OCCULT BLOOD 02/18/2009   ESOPHAGEAL STRICTURE 02/16/2009   GERD 02/16/2009   MENOPAUSE, SURGICAL 02/24/2008   SKIN CANCER, HX OF 02/24/2008   Personal history of other diseases of digestive system 02/24/2008    Arliss Journey, PT, DPT  01/17/2022, 11:38 AM  [facility] [address] [place], [place], [address] Phone: [phone]   Fax:  [phone]  Name: [name] Stone MRN: [id number] Date of Birth: [birth year]

## 2022-01-24 ENCOUNTER — Encounter: Payer: Self-pay | Admitting: Physical Therapy

## 2022-01-24 ENCOUNTER — Ambulatory Visit: Payer: Medicare PPO | Admitting: Physical Therapy

## 2022-01-24 ENCOUNTER — Other Ambulatory Visit: Payer: Self-pay

## 2022-01-24 DIAGNOSIS — R2689 Other abnormalities of gait and mobility: Secondary | ICD-10-CM

## 2022-01-24 DIAGNOSIS — R2681 Unsteadiness on feet: Secondary | ICD-10-CM | POA: Diagnosis not present

## 2022-01-24 DIAGNOSIS — M6281 Muscle weakness (generalized): Secondary | ICD-10-CM

## 2022-01-24 DIAGNOSIS — R42 Dizziness and giddiness: Secondary | ICD-10-CM | POA: Diagnosis not present

## 2022-01-24 NOTE — Therapy (Addendum)
[facility] [address] [place], [place], [address] Phone: [phone]   Fax:  [phone]  Physical Therapy Treatment  Patient Details  Name: [name] Stone MRN: [id number] Date of Birth: Jan 24, 1951 Referring Provider (PT): Lawerance Cruel, MD   Encounter Date: 01/24/2022   PT End of Session - 01/24/22 0803     Visit Number 14    Number of Visits 18    Date for PT Re-Evaluation 02/20/22    Authorization Type [employer] - Medicare    Authorization Time Period 12 visits from 11/22/21 - 02/10/22 - waiting for additional auth    PT Start Time 0802    PT Stop Time 0843    PT Time Calculation (min) 41 min    Equipment Utilized During Treatment Gait belt    Activity Tolerance Patient tolerated treatment well    Behavior During Therapy [facility] for tasks assessed/performed             Past Medical History:  Diagnosis Date   Ankle fracture, left 2005   Arthritis    Cancer ([place])    basal cell ca removed from arm   Complaints of memory disturbance    evaluated by Dr  Marijean Bravo   Depression    Dysrhythmia    Has PACs   Esophageal stricture    Esophagitis    Family history of adverse reaction to anesthesia    MOTHER ALWAYS HAS N/V WITH SURGERY    GERD (gastroesophageal reflux disease)    History of environmental allergies    GETS HIVES OCCASIONALLY WHEN NOT USING JUICE PLUS PILLS    Pneumonia    years ago   Premature atrial contractions    followed by DR Wynonia Lawman     Past Surgical History:  Procedure Laterality Date   [place] DECOMP/DISCECTOMY FUSION  12/11/2011   Procedure: ANTERIOR CERVICAL DECOMPRESSION/DISCECTOMY FUSION 2 LEVELS;  Surgeon: Earleen Newport;  Location: [place] [facility];  Service: Neurosurgery;  Laterality: N/A;  C5-6 C6-7 Anterior cervical decompression/diskectomy fusion   ANTERIOR FUSION CERVICAL SPINE  2000   COLONOSCOPY     every 5 years for FH colon polyps    CORNEAL TRANSPLANT     ESOPHAGEAL MANOMETRY N/A 05/25/2016   Procedure: ESOPHAGEAL MANOMETRY (EM);  Surgeon: Manus Gunning, MD;  [facility]: [facility];  Service: Gastroenterology;  Laterality: N/A;   EYE SURGERY     x 5 right eye   INCONTINENCE SURGERY     KNEE ARTHROSCOPY Right 2015   KNEE ARTHROSCOPY W/ LATERAL RELEASE Right    KNEE ARTHROSCOPY W/ MENISCAL REPAIR Left    KNEE CLOSED REDUCTION Right 02/10/2018   Procedure: CLOSED MANIPULATION RIGHT KNEE;  Surgeon: Latanya Maudlin, MD;  Location: [facility];  Service: Orthopedics;  Laterality: Right;   NISSEN FUNDOPLICATION  74/08/1447   SCAR DEBRIDEMENT OF TOTAL KNEE Right 06/11/2019   Procedure: Irrigation and debridement excision of scar right total knee arthroplasty, with  poly revision;  Surgeon: Paralee Cancel, MD;  Location: [facility];  Service: Orthopedics;  Laterality: Right;  90 mins   TOTAL KNEE ARTHROPLASTY Right 12/04/2017   Procedure: RIGHT TOTAL KNEE ARTHROPLASTY;  Surgeon: Latanya Maudlin, MD;  Location: [facility];  Service: Orthopedics;  Laterality: Right;    There were no vitals filed for this visit.   Subjective Assessment - 01/24/22 0804     Subjective Nothing new. No dizziness episodes.    Pertinent History PMH: L ankle fx  2005, R TKA, memory problems, hx of cervical and lumbar surgeries.    Limitations Walking    Patient Stated Goals check how balance is doing.    Currently in Pain? No/denies                               [facility] Adult PT Treatment/Exercise - 01/24/22 0806       Knee/Hip Exercises: Aerobic   Stepper Seated SciFit for strengthening, endurance at gear 3.3 for 8 minutes with BLE/BUE                 Balance Exercises - 01/24/22 0823       Balance Exercises: Standing   Standing Eyes Closed Foam/compliant surface;4 reps;30 secs;Limitations    Standing Eyes Closed Limitations Feet together on air ex x30 seconds, x5 reps head turns, x5 reps head nods, x10 reps head turns,  x10 reps head nods with incr postural sway.    SLS with Vectors Foam/compliant surface    SLS with Vectors Limitations Standing on blue foam beam: SLS with forard/lateral/backward tap x6 reps each leg, incr difficulty with LLE    Rockerboard Lateral;EC;30 seconds   2 reps   Step Ups 6 inch;Limitations    Step Ups Limitations x8 reps each side floating non stance leg, then an additional x5 reps each side adding in same sided head turn, with intermittent taps to bars for balance    Tandem Gait Forward;1 rep;Foam/compliant surface    Tandem Gait Limitations On blue foam beam down and back x1 rep, holding in tandem and turning head R/L before taking another step forward in tandem    Other Standing Exercises Bouncing on trampoline x15 reps, slow marching and adding in head turns x10 reps.                  PT Short Term Goals - 12/27/21 0810       PT SHORT TERM GOAL #1   Title Pt will be independent with initial HEP in order to build upon functional gains made in therapy. ALL STGS DUE 12/17/21    Baseline pt independent with initial HEP    Time 3    Period Weeks    Status Achieved    Target Date 12/17/21      PT SHORT TERM GOAL #2   Title Pt will decr 5x sit <> stand time to 16 seconds or less without UE support in order to demo improved functional strength/decr fall risk.    Baseline 18.13 seconds; 12.40 seconds on 12/27/21    Time 3    Period Weeks    Status Achieved      PT SHORT TERM GOAL #3   Title Pt will improve FGA score to at least a 23/30 in order to demo decr fall risk.    Baseline 21/30; 24/30 on 12/27/21    Time 3    Period Weeks    Status Achieved               PT Long Term Goals - 01/10/22 1610       PT LONG TERM GOAL #1   Title Pt will be independent with final HEP in order to build upon functional gains made in therapy.  ALL LTGS 02/07/22    Baseline pt reports doing her HEP at home, will benefit from continued review/additions    Time 10    Period  Weeks  Status On-going    Target Date 02/07/22      PT LONG TERM GOAL #2   Title Pt will improve FGA score to at least a 28/30 in order to demo decr fall risk.    Baseline 21/30; 26/30 on 01/10/22    Time 10    Period Weeks    Status Revised      PT LONG TERM GOAL #3   Title Pt will score WNL for vision and vestibular under sensory analysis on the SOT.    Baseline below normal vision and vestibular, WNL somatosensory    Time 10    Period Weeks    Status New      PT LONG TERM GOAL #5   Title Pt will improve DVA to at least line 6 in order to demo improved VOR.    Baseline static: line 8, dyanmic: line 4    Time 10    Period Weeks    Status On-going                   Plan - 01/24/22 1038     Clinical Impression Statement Today's skilled session continued to focus on BLE strengthening and balance on compliant surfaces with incr vestibular input. Pt tolerated session well, able to progress feet together eyes closed with head motions on foam. Pt more challenged with SLS tasks on LLE. Will continue to progress towards LTGs.    Personal Factors and Comorbidities Comorbidity 3+;Past/Current Experience;Time since onset of injury/illness/exacerbation    Comorbidities L ankle fx 2005, R TKA, memory problems, hx of cervical and lumbar surgeries.    Examination-Activity Limitations Locomotion Level    Examination-Participation Restrictions Community Activity    Stability/Clinical Decision Making Evolving/Moderate complexity    Rehab Potential Good    PT Frequency 2x / week    PT Duration 12 weeks    PT Treatment/Interventions ADLs/Self Care Home Management;Aquatic Therapy;Gait training;Stair training;Functional mobility training;Therapeutic activities;Neuromuscular re-education;Balance training;Therapeutic exercise;Patient/family education;Energy conservation;Vestibular;Canalith Repostioning    PT Next Visit Plan check LTGs- anticipate D/C?    Consulted and Agree with Plan of  Care Patient             Patient will benefit from skilled therapeutic intervention in order to improve the following deficits and impairments:  Abnormal gait, Decreased balance, Decreased activity tolerance, Decreased strength, Difficulty walking, Postural dysfunction, Decreased coordination  Visit Diagnosis: Unsteadiness on feet  Dizziness and giddiness  Muscle weakness (generalized)  Other abnormalities of gait and mobility     Problem List Patient Active Problem List   Diagnosis Date Noted   Arthrofibrosis of knee joint, right 06/11/2019   Hx of total knee arthroplasty, right 12/04/2017   Atypical chest pain    Mild cognitive impairment with memory loss 04/29/2013   Cervical spondylosis with radiculopathy 12/11/2011   FECAL OCCULT BLOOD 02/18/2009   ESOPHAGEAL STRICTURE 02/16/2009   GERD 02/16/2009   MENOPAUSE, SURGICAL 02/24/2008   SKIN CANCER, HX OF 02/24/2008   Personal history of other diseases of digestive system 02/24/2008    Arliss Journey, PT, DPT 01/24/2022, 10:39 AM  [facility] [address] [place], [place], [address] Phone: [phone]   Fax:  [phone]  Name: [name] Stone MRN: [id number] Date of Birth: 07-Aug-1951

## 2022-01-26 ENCOUNTER — Ambulatory Visit: Payer: Medicare PPO | Admitting: Physical Therapy

## 2022-01-30 ENCOUNTER — Ambulatory Visit: Payer: Medicare PPO | Admitting: Physical Therapy

## 2022-01-31 ENCOUNTER — Other Ambulatory Visit: Payer: Self-pay

## 2022-01-31 ENCOUNTER — Encounter: Payer: Self-pay | Admitting: Physical Therapy

## 2022-01-31 ENCOUNTER — Ambulatory Visit: Payer: Medicare PPO | Attending: Family Medicine | Admitting: Physical Therapy

## 2022-01-31 DIAGNOSIS — M6281 Muscle weakness (generalized): Secondary | ICD-10-CM | POA: Insufficient documentation

## 2022-01-31 DIAGNOSIS — R42 Dizziness and giddiness: Secondary | ICD-10-CM | POA: Insufficient documentation

## 2022-01-31 DIAGNOSIS — R2681 Unsteadiness on feet: Secondary | ICD-10-CM | POA: Insufficient documentation

## 2022-01-31 NOTE — Therapy (Signed)
[facility] [address] [place], [place], [address] Phone: [phone]   Fax:  [phone]  Physical Therapy Treatment/Discharge Summary  Patient Details  Name: [name] Stone MRN: [id number] Date of Birth: April 28, 1951 Referring Provider (PT): Lawerance Cruel, MD   Encounter Date: 01/31/2022   PT End of Session - 01/31/22 1229     Visit Number 15    Number of Visits 18    Date for PT Re-Evaluation 02/20/22    Authorization Type [employer] - Medicare    Authorization Time Period 12 visits from 11/22/21 - 02/10/22 - waiting for additional auth    PT Start Time 1228    PT Stop Time 1307   full time not used due to D/C visit   PT Time Calculation (min) 39 min    Equipment Utilized During Treatment Gait belt    Activity Tolerance Patient tolerated treatment well    Behavior During Therapy [facility] for tasks assessed/performed             Past Medical History:  Diagnosis Date   Ankle fracture, left 2005   Arthritis    Cancer ([place])    basal cell ca removed from arm   Complaints of memory disturbance    evaluated by Dr  Marijean Bravo   Depression    Dysrhythmia    Has PACs   Esophageal stricture    Esophagitis    Family history of adverse reaction to anesthesia    MOTHER ALWAYS HAS N/V WITH SURGERY    GERD (gastroesophageal reflux disease)    History of environmental allergies    GETS HIVES OCCASIONALLY WHEN NOT USING JUICE PLUS PILLS    Pneumonia    years ago   Premature atrial contractions    followed by DR Wynonia Lawman     Past Surgical History:  Procedure Laterality Date   [place] DECOMP/DISCECTOMY FUSION  12/11/2011   Procedure: ANTERIOR CERVICAL DECOMPRESSION/DISCECTOMY FUSION 2 LEVELS;  Surgeon: Earleen Newport;  Location: [place] [facility];  Service: Neurosurgery;  Laterality: N/A;  C5-6 C6-7 Anterior cervical decompression/diskectomy fusion   ANTERIOR FUSION CERVICAL SPINE  2000    COLONOSCOPY     every 5 years for FH colon polyps   CORNEAL TRANSPLANT     ESOPHAGEAL MANOMETRY N/A 05/25/2016   Procedure: ESOPHAGEAL MANOMETRY (EM);  Surgeon: Manus Gunning, MD;  [facility];  Service: Gastroenterology;  Laterality: N/A;   EYE SURGERY     x 5 right eye   INCONTINENCE SURGERY     KNEE ARTHROSCOPY Right 2015   KNEE ARTHROSCOPY W/ LATERAL RELEASE Right    KNEE ARTHROSCOPY W/ MENISCAL REPAIR Left    KNEE CLOSED REDUCTION Right 02/10/2018   Procedure: CLOSED MANIPULATION RIGHT KNEE;  Surgeon: Latanya Maudlin, MD;  [facility];  Service: Orthopedics;  Laterality: Right;   NISSEN FUNDOPLICATION  00/37/0488   SCAR DEBRIDEMENT OF TOTAL KNEE Right 06/11/2019   Procedure: Irrigation and debridement excision of scar right total knee arthroplasty, with  poly revision;  Surgeon: Paralee Cancel, MD;  [facility];  Service: Orthopedics;  Laterality: Right;  90 mins   TOTAL KNEE ARTHROPLASTY Right 12/04/2017   Procedure: RIGHT TOTAL KNEE ARTHROPLASTY;  Surgeon: Latanya Maudlin, MD;  [facility];  Service: Orthopedics;  Laterality: Right;    There were no vitals filed for this visit.   Subjective Assessment - 01/31/22 1230     Subjective Doing good, no changes.  Pertinent History PMH: L ankle fx 2005, R TKA, memory problems, hx of cervical and lumbar surgeries.    Limitations Walking    Patient Stated Goals check how balance is doing.    Currently in Pain? No/denies                [facility] PT Assessment - 01/31/22 1300       Functional Gait  Assessment   Gait assessed  Yes    Gait Level Surface Walks 20 ft in less than 5.5 sec, no assistive devices, good speed, no evidence for imbalance, normal gait pattern, deviates no more than 6 in outside of the 12 in walkway width.    Change in Gait Speed Able to smoothly change walking speed without loss of balance or gait deviation. Deviate no more than 6 in outside of the 12 in walkway width.    Gait  with Horizontal Head Turns Performs head turns smoothly with no change in gait. Deviates no more than 6 in outside 12 in walkway width    Gait with Vertical Head Turns Performs head turns with no change in gait. Deviates no more than 6 in outside 12 in walkway width.    Gait and Pivot Turn Pivot turns safely within 3 sec and stops quickly with no loss of balance.    Step Over Obstacle Is able to step over 2 stacked shoe boxes taped together (9 in total height) without changing gait speed. No evidence of imbalance.    Gait with Narrow Base of Support Is able to ambulate for 10 steps heel to toe with no staggering.    Gait with Eyes Closed Walks 20 ft, uses assistive device, slower speed, mild gait deviations, deviates 6-10 in outside 12 in walkway width. Ambulates 20 ft in less than 9 sec but greater than 7 sec.   7.2   Ambulating Backwards Walks 20 ft, no assistive devices, good speed, no evidence for imbalance, normal gait    Steps Alternating feet, no rail.    Total Score 29    FGA comment: 29/30                 Vestibular Assessment - 01/31/22 1230       Visual Acuity   Static line 8    Dynamic line 7   only very mild sx                  Conditions: 1: 2 trials WNL 2: 2 trials WNL 3:  WNL 4: WNL 5: WNL 6: WNL Composite score: 80 (above normal) Sensory Analysis Som: WNL Vis: WNL  Vest: WNL Pref: WNL Strategy analysis: equal hip/ankle strategy COG alignment: Pt with midline COG but more posteriorly    Reviewed final HEP:   Access Code: [email or web address] URL: [email or web address] Date: 01/31/2022 Prepared by: Janann August  Exercises Standing Marching - 2 x daily - 5 x weekly - 3 sets Tandem Walking with Counter Support - 2 x daily - 5 x weekly - 3 sets Sit to Stand - 2 x daily - 5 x weekly - 2 sets - 5 reps Romberg Stance Eyes Closed on Foam Pad - 1-2 x daily - 5 x weekly - 3 sets - 30 hold Wide Stance with Eyes Closed on Foam Pad - 1-2 x  daily - 5 x weekly - 2 sets - 10 reps - with x10 reps head turns, x10 reps head nods  Brandt-Daroff Vestibular Exercise - 2 x daily - 7 x  weekly - 2 sets - 5 reps  Plus standing VOR x1 on compliant surface with horizontal/vertical head motions for 30 seconds.      PT Education - 01/31/22 1308     Education Details Progressed towards goals, reviewed final HEP, would need to get a new referral to return to therapy if BPPV sx/dizziness returns.    Person(s) Educated Patient    Methods Explanation    Comprehension Verbalized understanding              PT Short Term Goals - 12/27/21 0810       PT SHORT TERM GOAL #1   Title Pt will be independent with initial HEP in order to build upon functional gains made in therapy. ALL STGS DUE 12/17/21    Baseline pt independent with initial HEP    Time 3    Period Weeks    Status Achieved    Target Date 12/17/21      PT SHORT TERM GOAL #2   Title Pt will decr 5x sit <> stand time to 16 seconds or less without UE support in order to demo improved functional strength/decr fall risk.    Baseline 18.13 seconds; 12.40 seconds on 12/27/21    Time 3    Period Weeks    Status Achieved      PT SHORT TERM GOAL #3   Title Pt will improve FGA score to at least a 23/30 in order to demo decr fall risk.    Baseline 21/30; 24/30 on 12/27/21    Time 3    Period Weeks    Status Achieved               PT Long Term Goals - 01/31/22 1233       PT LONG TERM GOAL #1   Title Pt will be independent with final HEP in order to build upon functional gains made in therapy.  ALL LTGS 02/07/22    Baseline pt reports doing her HEP at home, will benefit from continued review/additions    Time 10    Period Weeks    Status Achieved    Target Date 02/07/22      PT LONG TERM GOAL #2   Title Pt will improve FGA score to at least a 28/30 in order to demo decr fall risk.    Baseline 21/30; 26/30 on 01/10/22; 29/30 on 01/31/22    Time 10    Period Weeks     Status Achieved      PT LONG TERM GOAL #3   Title Pt will score WNL for vision and vestibular under sensory analysis on the SOT.    Baseline WNL vision, vestibular, somatosensory    Time 10    Period Weeks    Status Achieved      PT LONG TERM GOAL #5   Title Pt will improve DVA to at least line 6 in order to demo improved VOR.    Baseline static: line 8, dyanmic: line 4, dynamic line 7 on 01/31/22    Time 10    Period Weeks    Status Achieved             PHYSICAL THERAPY DISCHARGE SUMMARY  Visits from Start of Care: 15   Current functional level related to goals / functional outcomes: See LTGs.   Remaining deficits: N/A   Education / Equipment: HEP   Patient agrees to discharge. Patient goals were met. Patient is being discharged due to meeting the  stated rehab goals.       Plan - 01/31/22 1311     Clinical Impression Statement Today's skilled session focused on assessing pt's LTGs for D/C. Pt has met all 4 LTGs. Pt improved FGA score to a 29/30 indicating no fall risk. Pt scored above normal limits for all 3 sensory analyses on the SOT. Due to progress towards goals and pt pleased with her progress/no dizziness episodes, pt will be discharged from PT. Pt in agreement with plan and will continue with HEP.    Personal Factors and Comorbidities Comorbidity 3+;Past/Current Experience;Time since onset of injury/illness/exacerbation    Comorbidities L ankle fx 2005, R TKA, memory problems, hx of cervical and lumbar surgeries.    Examination-Activity Limitations Locomotion Level    Examination-Participation Restrictions Community Activity    Stability/Clinical Decision Making Evolving/Moderate complexity    Rehab Potential Good    PT Frequency 2x / week    PT Duration 12 weeks    PT Treatment/Interventions ADLs/Self Care Home Management;Aquatic Therapy;Gait training;Stair training;Functional mobility training;Therapeutic activities;Neuromuscular re-education;Balance  training;Therapeutic exercise;Patient/family education;Energy conservation;Vestibular;Canalith Repostioning    PT Next Visit Plan D/C    Consulted and Agree with Plan of Care Patient             Patient will benefit from skilled therapeutic intervention in order to improve the following deficits and impairments:  Abnormal gait, Decreased balance, Decreased activity tolerance, Decreased strength, Difficulty walking, Postural dysfunction, Decreased coordination  Visit Diagnosis: Unsteadiness on feet  Dizziness and giddiness  Muscle weakness (generalized)     Problem List Patient Active Problem List   Diagnosis Date Noted   Arthrofibrosis of knee joint, right 06/11/2019   Hx of total knee arthroplasty, right 12/04/2017   Atypical chest pain    Mild cognitive impairment with memory loss 04/29/2013   Cervical spondylosis with radiculopathy 12/11/2011   FECAL OCCULT BLOOD 02/18/2009   ESOPHAGEAL STRICTURE 02/16/2009   GERD 02/16/2009   MENOPAUSE, SURGICAL 02/24/2008   SKIN CANCER, HX OF 02/24/2008   Personal history of other diseases of digestive system 02/24/2008    Arliss Journey, PT, DPT  01/31/2022, 1:12 PM  [facility] [address] [place], [place], [address] Phone: [phone]   Fax:  [phone]  Name: [name] Stone MRN: [id number] Date of Birth: 05-May-1951

## 2022-02-02 ENCOUNTER — Ambulatory Visit: Payer: Medicare PPO

## 2022-03-02 DIAGNOSIS — E7800 Pure hypercholesterolemia, unspecified: Secondary | ICD-10-CM | POA: Diagnosis not present

## 2022-03-02 DIAGNOSIS — E559 Vitamin D deficiency, unspecified: Secondary | ICD-10-CM | POA: Diagnosis not present

## 2022-03-02 DIAGNOSIS — Z79899 Other long term (current) drug therapy: Secondary | ICD-10-CM | POA: Diagnosis not present

## 2022-03-08 DIAGNOSIS — K219 Gastro-esophageal reflux disease without esophagitis: Secondary | ICD-10-CM | POA: Diagnosis not present

## 2022-03-08 DIAGNOSIS — Z0000 Encounter for general adult medical examination without abnormal findings: Secondary | ICD-10-CM | POA: Diagnosis not present

## 2022-03-08 DIAGNOSIS — R413 Other amnesia: Secondary | ICD-10-CM | POA: Diagnosis not present

## 2022-03-08 DIAGNOSIS — E559 Vitamin D deficiency, unspecified: Secondary | ICD-10-CM | POA: Diagnosis not present

## 2022-03-08 DIAGNOSIS — E7800 Pure hypercholesterolemia, unspecified: Secondary | ICD-10-CM | POA: Diagnosis not present

## 2022-03-08 DIAGNOSIS — Z79899 Other long term (current) drug therapy: Secondary | ICD-10-CM | POA: Diagnosis not present

## 2022-05-18 DIAGNOSIS — Z947 Corneal transplant status: Secondary | ICD-10-CM | POA: Diagnosis not present

## 2022-05-18 DIAGNOSIS — H40013 Open angle with borderline findings, low risk, bilateral: Secondary | ICD-10-CM | POA: Diagnosis not present

## 2022-05-18 DIAGNOSIS — H26492 Other secondary cataract, left eye: Secondary | ICD-10-CM | POA: Diagnosis not present

## 2022-05-18 DIAGNOSIS — H18513 Endothelial corneal dystrophy, bilateral: Secondary | ICD-10-CM | POA: Diagnosis not present

## 2022-06-25 ENCOUNTER — Encounter (HOSPITAL_BASED_OUTPATIENT_CLINIC_OR_DEPARTMENT_OTHER): Payer: Self-pay | Admitting: Internal Medicine

## 2022-06-25 ENCOUNTER — Ambulatory Visit (HOSPITAL_BASED_OUTPATIENT_CLINIC_OR_DEPARTMENT_OTHER): Payer: Medicare PPO | Admitting: Internal Medicine

## 2022-06-25 VITALS — BP 110/70 | HR 66 | Ht 65.0 in | Wt 186.6 lb

## 2022-06-25 DIAGNOSIS — R413 Other amnesia: Secondary | ICD-10-CM | POA: Diagnosis not present

## 2022-06-25 DIAGNOSIS — I491 Atrial premature depolarization: Secondary | ICD-10-CM

## 2022-06-25 DIAGNOSIS — Z1159 Encounter for screening for other viral diseases: Secondary | ICD-10-CM | POA: Diagnosis not present

## 2022-07-08 DIAGNOSIS — R200 Anesthesia of skin: Secondary | ICD-10-CM | POA: Diagnosis not present

## 2022-07-08 DIAGNOSIS — R413 Other amnesia: Secondary | ICD-10-CM | POA: Diagnosis not present

## 2022-07-08 DIAGNOSIS — G9389 Other specified disorders of brain: Secondary | ICD-10-CM | POA: Diagnosis not present

## 2022-07-31 ENCOUNTER — Other Ambulatory Visit: Payer: Self-pay | Admitting: Internal Medicine

## 2022-07-31 ENCOUNTER — Encounter (HOSPITAL_BASED_OUTPATIENT_CLINIC_OR_DEPARTMENT_OTHER): Payer: Self-pay | Admitting: Internal Medicine

## 2022-07-31 MED ORDER — METOPROLOL SUCCINATE ER 25 MG PO TB24: ORAL_TABLET | ORAL | 3 refills | Status: DC

## 2022-08-20 DIAGNOSIS — M5134 Other intervertebral disc degeneration, thoracic region: Secondary | ICD-10-CM | POA: Diagnosis not present

## 2022-08-20 DIAGNOSIS — N958 Other specified menopausal and perimenopausal disorders: Secondary | ICD-10-CM | POA: Diagnosis not present

## 2022-08-20 DIAGNOSIS — M5136 Other intervertebral disc degeneration, lumbar region: Secondary | ICD-10-CM | POA: Diagnosis not present

## 2022-08-20 DIAGNOSIS — R29890 Loss of height: Secondary | ICD-10-CM | POA: Diagnosis not present

## 2022-09-05 DIAGNOSIS — R419 Unspecified symptoms and signs involving cognitive functions and awareness: Secondary | ICD-10-CM | POA: Diagnosis not present

## 2022-10-02 DIAGNOSIS — Z1231 Encounter for screening mammogram for malignant neoplasm of breast: Secondary | ICD-10-CM | POA: Diagnosis not present

## 2022-10-03 DIAGNOSIS — R419 Unspecified symptoms and signs involving cognitive functions and awareness: Secondary | ICD-10-CM | POA: Diagnosis not present

## 2022-10-16 DIAGNOSIS — R5383 Other fatigue: Secondary | ICD-10-CM | POA: Diagnosis not present

## 2022-10-16 DIAGNOSIS — U071 COVID-19: Secondary | ICD-10-CM | POA: Diagnosis not present

## 2022-10-16 DIAGNOSIS — R059 Cough, unspecified: Secondary | ICD-10-CM | POA: Diagnosis not present

## 2022-10-16 DIAGNOSIS — R519 Headache, unspecified: Secondary | ICD-10-CM | POA: Diagnosis not present

## 2022-10-25 DIAGNOSIS — S9032XA Contusion of left foot, initial encounter: Secondary | ICD-10-CM | POA: Diagnosis not present

## 2022-10-25 DIAGNOSIS — M25572 Pain in left ankle and joints of left foot: Secondary | ICD-10-CM | POA: Diagnosis not present

## 2022-10-25 DIAGNOSIS — M79672 Pain in left foot: Secondary | ICD-10-CM | POA: Diagnosis not present

## 2022-10-25 DIAGNOSIS — S93402A Sprain of unspecified ligament of left ankle, initial encounter: Secondary | ICD-10-CM | POA: Diagnosis not present

## 2022-10-25 DIAGNOSIS — S99922A Unspecified injury of left foot, initial encounter: Secondary | ICD-10-CM | POA: Diagnosis not present

## 2022-10-25 DIAGNOSIS — T1490XA Injury, unspecified, initial encounter: Secondary | ICD-10-CM | POA: Diagnosis not present

## 2022-10-25 DIAGNOSIS — M7989 Other specified soft tissue disorders: Secondary | ICD-10-CM | POA: Diagnosis not present

## 2022-10-29 DIAGNOSIS — M25572 Pain in left ankle and joints of left foot: Secondary | ICD-10-CM | POA: Diagnosis not present

## 2022-11-06 ENCOUNTER — Ambulatory Visit
Admission: RE | Admit: 2022-11-06 | Discharge: 2022-11-06 | Disposition: A | Discharge status: Home / Self Care | Payer: Medicare PPO | Source: Ambulatory Visit | Attending: Family Medicine | Admitting: Family Medicine

## 2022-11-06 VITALS — BP 143/73 | HR 95 | Temp 98.0°F | Resp 17

## 2022-11-06 DIAGNOSIS — J209 Acute bronchitis, unspecified: Secondary | ICD-10-CM

## 2022-11-06 DIAGNOSIS — R058 Other specified cough: Secondary | ICD-10-CM | POA: Diagnosis not present

## 2022-11-06 MED ORDER — HYDROCOD POLI-CHLORPHE POLI ER 10-8 MG/5ML PO SUER
5.0000 mL | Freq: Two times a day (BID) | ORAL | 0 refills | Status: DC | PRN
Start: 2022-11-06 — End: 2022-12-13

## 2022-11-06 MED ORDER — HYDROCOD POLI-CHLORPHE POLI ER 10-8 MG/5ML PO SUER: 5.0000 mL | Freq: Two times a day (BID) | ORAL | 0 refills | Status: DC | PRN

## 2022-11-06 MED ORDER — AZITHROMYCIN 250 MG PO TABS: 250.0000 mg | ORAL_TABLET | Freq: Every day | ORAL | 0 refills | Status: DC

## 2022-11-06 MED ORDER — PREDNISONE 20 MG PO TABS: 40.0000 mg | ORAL_TABLET | Freq: Every day | ORAL | 0 refills | Status: DC

## 2022-11-06 NOTE — ED Provider Notes (Signed)
[facility]    CSN: [id number] Arrival date & time: 11/06/22  1150      History   Chief Complaint Chief Complaint  Patient presents with   Cough    HPI [name] Stone is a 71 y.o. female.   HPI  Patient states that she is generally quite healthy.  She currently has a cough.  She had COVID 3 weeks ago.  She was very tired for few days and had some postnasal drip and clearing of her throat.  This is turned into a cough.  She now has bad coughing spells.  The cough is keeping her awake at night.  It is not getting better with over-the-counter medication.  No fever or chills.  No headache or body ache.  She is no longer experiencing fatigue  Past Medical History:  Diagnosis Date   Ankle fracture, left 2005   Arthritis    Cancer ([place])    basal cell ca removed from arm   Complaints of memory disturbance    evaluated by Dr  Marijean Bravo   Depression    Dysrhythmia    Has PACs   Esophageal stricture    Esophagitis    Family history of adverse reaction to anesthesia    MOTHER ALWAYS HAS N/V WITH SURGERY    GERD (gastroesophageal reflux disease)    History of environmental allergies    GETS HIVES OCCASIONALLY WHEN NOT USING JUICE PLUS PILLS    Pneumonia    years ago   Premature atrial contractions    followed by DR Wynonia Lawman     Patient Active Problem List   Diagnosis Date Noted   Arthrofibrosis of knee joint, right 06/11/2019   Hx of total knee arthroplasty, right 12/04/2017   Atypical chest pain    Mild cognitive impairment with memory loss 04/29/2013   Cervical spondylosis with radiculopathy 12/11/2011   FECAL OCCULT BLOOD 02/18/2009   ESOPHAGEAL STRICTURE 02/16/2009   GERD 02/16/2009   MENOPAUSE, SURGICAL 02/24/2008   SKIN CANCER, HX OF 02/24/2008   Personal history of other diseases of digestive system 02/24/2008    Past Surgical History:  Procedure Laterality Date   [place] DECOMP/DISCECTOMY FUSION  12/11/2011    Procedure: ANTERIOR CERVICAL DECOMPRESSION/DISCECTOMY FUSION 2 LEVELS;  Surgeon: Earleen Newport;  Location: [place] [facility];  Service: Neurosurgery;  Laterality: N/A;  C5-6 C6-7 Anterior cervical decompression/diskectomy fusion   ANTERIOR FUSION CERVICAL SPINE  2000   COLONOSCOPY     every 5 years for FH colon polyps   CORNEAL TRANSPLANT     ESOPHAGEAL MANOMETRY N/A 05/25/2016   Procedure: ESOPHAGEAL MANOMETRY (EM);  Surgeon: Manus Gunning, MD;  [facility]: [facility];  Service: Gastroenterology;  Laterality: N/A;   EYE SURGERY     x 5 right eye   INCONTINENCE SURGERY     KNEE ARTHROSCOPY Right 2015   KNEE ARTHROSCOPY W/ LATERAL RELEASE Right    KNEE ARTHROSCOPY W/ MENISCAL REPAIR Left    KNEE CLOSED REDUCTION Right 02/10/2018   Procedure: CLOSED MANIPULATION RIGHT KNEE;  Surgeon: Latanya Maudlin, MD;  Location: [facility];  Service: Orthopedics;  Laterality: Right;   [place]ISSEN FUNDOPLICATION  23/55/7322   SCAR DEBRIDEMENT OF TOTAL KNEE Right 06/11/2019   Procedure: Irrigation and debridement excision of scar right total knee arthroplasty, with  poly revision;  Surgeon: Paralee Cancel, MD;  Location: [facility];  Service: Orthopedics;  Laterality: Right;  90 mins   TOTAL KNEE ARTHROPLASTY Right 12/04/2017  Procedure: RIGHT TOTAL KNEE ARTHROPLASTY;  Surgeon: Latanya Maudlin, MD;  Location: [facility];  Service: Orthopedics;  Laterality: Right;    OB History   No obstetric history on file.      Home Medications    Prior to Admission medications   Medication Sig Start Date End Date Taking? Authorizing Provider  azithromycin (ZITHROMAX) 250 MG tablet Take 1 tablet (250 mg total) by mouth daily. Take first 2 tablets together, then 1 every day until finished. 11/06/22  Yes Raylene Everts, MD  predniSONE (DELTASONE) 20 MG tablet Take 2 tablets (40 mg total) by mouth daily with breakfast. 11/06/22  Yes Raylene Everts, MD  betamethasone valerate (VALISONE) 0.1 % cream APPLY A THIN LAYER TO THE  AFFECTED AREA(S) BY TOPICAL ROUTE ONCE DAILY    [provider]  chlorpheniramine-HYDROcodone ([place]) 10-8 MG/5ML Take 5 mLs by mouth every 12 (twelve) hours as needed for cough. 11/06/22   Raylene Everts, MD  Cholecalciferol 25 MCG (1000 UT) tablet Take 1 tablet by mouth daily.    [provider]  esomeprazole (NEXIUM) 40 MG capsule Take 40 mg by mouth daily.     [provider]  estradiol (CLIMARA - DOSED IN MG/24 HR) 0.0375 mg/24hr patch Place 1 patch onto the skin 2 (two) times a week.     [provider]  LOTEMAX 0.5 % ophthalmic suspension Place 1 drop into the right eye every Wednesday. 10/09/17   [provider]  meclizine (ANTIVERT) 25 MG tablet Take 1 tablet by mouth daily as needed for dizziness. 03/02/20   [provider]  metoprolol succinate (TOPROL-XL) 25 MG 24 hr tablet TAKE 1 TABLET(25 MG) BY MOUTH DAILY 07/31/22   Allred, Jeneen Rinks, MD  prednisoLONE acetate (PRED FORTE) 1 % ophthalmic suspension Place 1 drop into both eyes once a week.    [provider]  sodium chloride (MURO 128) 5 % ophthalmic solution Place 1 drop into both eyes as needed for eye irritation.    [provider]    Family History Family History  Problem Relation Age of Onset   Skin cancer Father    Bladder Cancer Father    Skin cancer Brother    Skin cancer Paternal Grandfather    Colon polyps Brother    Colitis Mother    Colon cancer Neg Hx     Social History Social History   Tobacco Use   Smoking status: Never   Smokeless tobacco: Never  Vaping Use   Vaping Use: Never used  Substance Use Topics   Alcohol use: No   Drug use: No     Allergies   Aspirin and Demerol [meperidine hcl]   Review of Systems Review of Systems  See HPI Physical Exam Triage Vital Signs ED Triage Vitals  Enc Vitals Group     BP 11/06/22 1158 (!) 143/73     Pulse Rate 11/06/22 1158 95     Resp 11/06/22 1158 17     Temp 11/06/22 1158 98  F (36.7 C)     Temp Source 11/06/22 1158 Oral     SpO2 11/06/22 1158 98 %     Weight --      Height --      Head Circumference --      Peak Flow --      Pain Score 11/06/22 1159 0     Pain Loc --      Pain Edu? --      Excl. in  GC? --    No data found.  Updated Vital Signs BP (!) 143/73 (BP Location: Right Arm)   Pulse 95   Temp 98 F (36.7 C) (Oral)   Resp 17   SpO2 98%      Physical Exam Constitutional:      General: She is not in acute distress.    Appearance: She is well-developed and normal weight.  HENT:     Head: Normocephalic and atraumatic.     Right Ear: Tympanic membrane and ear canal normal.     Left Ear: Tympanic membrane and ear canal normal.     Nose: Nose normal. No congestion.     Mouth/Throat:     Mouth: Mucous membranes are moist.     Pharynx: No posterior oropharyngeal erythema.  Eyes:     Conjunctiva/sclera: Conjunctivae normal.     Pupils: Pupils are equal, round, and reactive to light.  Cardiovascular:     Rate and Rhythm: Normal rate and regular rhythm.     Heart sounds: Normal heart sounds.  Pulmonary:     Effort: Pulmonary effort is normal. No respiratory distress.     Breath sounds: No wheezing.  Abdominal:     General: There is no distension.     Palpations: Abdomen is soft.  Musculoskeletal:        General: Normal range of motion.     Cervical back: Normal range of motion.  Lymphadenopathy:     Cervical: No cervical adenopathy.  Skin:    General: Skin is warm and dry.  Neurological:     Mental Status: She is alert.     Gait: Gait normal.  Psychiatric:        Mood and Affect: Mood normal.        Behavior: Behavior normal.      UC Treatments / Results  Labs (all labs ordered are listed, but only abnormal results are displayed) Labs Reviewed - No data to display  EKG   Radiology No results found.  Procedures Procedures (including critical care time)  Medications Ordered in UC Medications - No data to  display  Initial Impression / Assessment and Plan / UC Course  I have reviewed the triage vital signs and the nursing notes.  Pertinent labs & imaging results that were available during my care of the patient were reviewed by me and considered in my medical decision making (see chart for details).     Final Clinical Impressions(s) / UC Diagnoses   Final diagnoses:  Post-viral cough syndrome  Acute bronchitis, unspecified organism     Discharge Instructions      Make sure you are drinking lots of water Take the antibiotic Z-Pak as directed.  2 pills today then 1 a day until gone Take prednisone once a day for 5 days I have prescribed a stronger cough medicine.  May use every 12 hours.  Caution drowsiness.  This will help you sleep at night See your doctor if not improving by the end of the week    ED Prescriptions     Medication Sig Dispense Auth. Provider   predniSONE (DELTASONE) 20 MG tablet Take 2 tablets (40 mg total) by mouth daily with breakfast. 10 tablet Raylene Everts, MD   azithromycin (ZITHROMAX) 250 MG tablet Take 1 tablet (250 mg total) by mouth daily. Take first 2 tablets together, then 1 every day until finished. 6 tablet Raylene Everts, MD   chlorpheniramine-HYDROcodone (TUSSIONEX) 10-8 MG/5ML Take 5 mLs by mouth  every 12 (twelve) hours as needed for cough. 115 mL Raylene Everts, MD      I have reviewed the PDMP during this encounter.   Raylene Everts, MD 11/06/22 1225

## 2022-11-06 NOTE — Discharge Instructions (Signed)
Make sure you are drinking lots of water Take the antibiotic Z-Pak as directed.  2 pills today then 1 a day until gone Take prednisone once a day for 5 days I have prescribed a stronger cough medicine.  May use every 12 hours.  Caution drowsiness.  This will help you sleep at night See your doctor if not improving by the end of the week

## 2022-11-06 NOTE — ED Triage Notes (Signed)
Pt c/o persistent cough since having covid 3 weeks ago. All other sxs have resolved. Taking mucinex and robitussin prn. Also takes zyrtec daily. Hx of bronchitis.

## 2022-11-14 DIAGNOSIS — Z6832 Body mass index (BMI) 32.0-32.9, adult: Secondary | ICD-10-CM | POA: Diagnosis not present

## 2022-11-14 DIAGNOSIS — Z01419 Encounter for gynecological examination (general) (routine) without abnormal findings: Secondary | ICD-10-CM | POA: Diagnosis not present

## 2022-11-14 DIAGNOSIS — N952 Postmenopausal atrophic vaginitis: Secondary | ICD-10-CM | POA: Diagnosis not present

## 2022-11-19 DIAGNOSIS — S8255XD Nondisplaced fracture of medial malleolus of left tibia, subsequent encounter for closed fracture with routine healing: Secondary | ICD-10-CM | POA: Diagnosis not present

## 2022-12-04 DIAGNOSIS — S8255XD Nondisplaced fracture of medial malleolus of left tibia, subsequent encounter for closed fracture with routine healing: Secondary | ICD-10-CM | POA: Diagnosis not present

## 2022-12-13 ENCOUNTER — Ambulatory Visit (INDEPENDENT_AMBULATORY_CARE_PROVIDER_SITE_OTHER): Payer: Medicare PPO

## 2022-12-13 ENCOUNTER — Ambulatory Visit
Admission: RE | Admit: 2022-12-13 | Discharge: 2022-12-13 | Disposition: A | Discharge status: Home / Self Care | Payer: Medicare PPO | Source: Ambulatory Visit | Attending: Family Medicine | Admitting: Family Medicine

## 2022-12-13 VITALS — BP 172/74 | HR 84 | Temp 98.5°F | Resp 17

## 2022-12-13 DIAGNOSIS — R0989 Other specified symptoms and signs involving the circulatory and respiratory systems: Secondary | ICD-10-CM

## 2022-12-13 DIAGNOSIS — R059 Cough, unspecified: Secondary | ICD-10-CM

## 2022-12-13 DIAGNOSIS — R052 Subacute cough: Secondary | ICD-10-CM

## 2022-12-13 MED ORDER — BENZONATATE 200 MG PO CAPS: 200.0000 mg | ORAL_CAPSULE | Freq: Three times a day (TID) | ORAL | 0 refills | Status: DC | PRN

## 2022-12-13 MED ORDER — HYDROCOD POLI-CHLORPHE POLI ER 10-8 MG/5ML PO SUER
5.0000 mL | Freq: Two times a day (BID) | ORAL | 0 refills | Status: DC | PRN
Start: 2022-12-13 — End: 2023-09-17

## 2022-12-13 MED ORDER — PREDNISONE 20 MG PO TABS: 20.0000 mg | ORAL_TABLET | Freq: Two times a day (BID) | ORAL | 0 refills | Status: DC

## 2022-12-13 NOTE — ED Notes (Signed)
Pt requested a stronger cough medicine at night. This RN to review w/ Dr Meda Coffee & will only call [name] if additional medication is not prescribed - no other questions at tis time

## 2022-12-13 NOTE — Discharge Instructions (Signed)
Continue drinking lots of fluids Take the prednisone as directed May take Tessalon 2-3 times a day See your doctor if not improving in another week or 2

## 2022-12-13 NOTE — ED Triage Notes (Signed)
Pt c/o lingering cough from bronchitis from last month. Was seen 11/07 for bronchitis. Was tx with tussionex and zpak. Sxs ramped up again this weekend. Is flying to [facility] for wedding tomorrow so wanted to get checked out. Cough drops and delsym prn.

## 2022-12-13 NOTE — ED Provider Notes (Signed)
[facility]    CSN: [id number] Arrival date & time: 12/13/22  1101      History   Chief Complaint Chief Complaint  Patient presents with   Cough    HPI [name] Stone is a 71 y.o. female.   HPI  Patient is here for chronic cough.  She has been having cough ever since she had COVID more than a month ago.  I saw her and treated her with a Z-Pak and prednisone.  She got better briefly.  The cough then came right back.  She states that it is not productive.  She has no chest pain or shortness of breath.  Saw her 4 weeks ago, she had been coughing since she had COVID 3 weeks prior.  Approximately 8 weeks of cough at this time  Past Medical History:  Diagnosis Date   Ankle fracture, left 2005   Arthritis    Cancer ([place])    basal cell ca removed from arm   Complaints of memory disturbance    evaluated by Dr  Marijean Bravo   Depression    Dysrhythmia    Has PACs   Esophageal stricture    Esophagitis    Family history of adverse reaction to anesthesia    MOTHER ALWAYS HAS N/V WITH SURGERY    GERD (gastroesophageal reflux disease)    History of environmental allergies    GETS HIVES OCCASIONALLY WHEN NOT USING JUICE PLUS PILLS    Pneumonia    years ago   Premature atrial contractions    followed by DR Wynonia Lawman     Patient Active Problem List   Diagnosis Date Noted   Arthrofibrosis of knee joint, right 06/11/2019   Hx of total knee arthroplasty, right 12/04/2017   Atypical chest pain    Mild cognitive impairment with memory loss 04/29/2013   Cervical spondylosis with radiculopathy 12/11/2011   FECAL OCCULT BLOOD 02/18/2009   ESOPHAGEAL STRICTURE 02/16/2009   GERD 02/16/2009   MENOPAUSE, SURGICAL 02/24/2008   SKIN CANCER, HX OF 02/24/2008   Personal history of other diseases of digestive system 02/24/2008    Past Surgical History:  Procedure Laterality Date   [place] DECOMP/DISCECTOMY FUSION  12/11/2011   Procedure:  ANTERIOR CERVICAL DECOMPRESSION/DISCECTOMY FUSION 2 LEVELS;  Surgeon: Earleen Newport;  Location: [place] [facility];  Service: Neurosurgery;  Laterality: N/A;  C5-6 C6-7 Anterior cervical decompression/diskectomy fusion   ANTERIOR FUSION CERVICAL SPINE  2000   COLONOSCOPY     every 5 years for FH colon polyps   CORNEAL TRANSPLANT     ESOPHAGEAL MANOMETRY N/A 05/25/2016   Procedure: ESOPHAGEAL MANOMETRY (EM);  Surgeon: Manus Gunning, MD;  [facility];  Service: Gastroenterology;  Laterality: N/A;   EYE SURGERY     x 5 right eye   INCONTINENCE SURGERY     KNEE ARTHROSCOPY Right 2015   KNEE ARTHROSCOPY W/ LATERAL RELEASE Right    KNEE ARTHROSCOPY W/ MENISCAL REPAIR Left    KNEE CLOSED REDUCTION Right 02/10/2018   Procedure: CLOSED MANIPULATION RIGHT KNEE;  Surgeon: Latanya Maudlin, MD;  [facility];  Service: Orthopedics;  Laterality: Right;   NISSEN FUNDOPLICATION  96/29/5284   SCAR DEBRIDEMENT OF TOTAL KNEE Right 06/11/2019   Procedure: Irrigation and debridement excision of scar right total knee arthroplasty, with  poly revision;  Surgeon: Paralee Cancel, MD;  [facility];  Service: Orthopedics;  Laterality: Right;  90 mins   TOTAL KNEE ARTHROPLASTY Right  12/04/2017   Procedure: RIGHT TOTAL KNEE ARTHROPLASTY;  Surgeon: Latanya Maudlin, MD;  [facility];  Service: Orthopedics;  Laterality: Right;    OB History   No obstetric history on file.      Home Medications    Prior to Admission medications   Medication Sig Start Date End Date Taking? Authorizing Provider  benzonatate (TESSALON) 200 MG capsule Take 1 capsule (200 mg total) by mouth 3 (three) times daily as needed for cough. 12/13/22  Yes Raylene Everts, MD  chlorpheniramine-HYDROcodone (TUSSIONEX) 10-8 MG/5ML Take 5 mLs by mouth every 12 (twelve) hours as needed for cough. 12/13/22  Yes Raylene Everts, MD  predniSONE (DELTASONE) 20 MG tablet Take 1 tablet (20 mg total) by mouth 2 (two) times  daily with a meal. 12/13/22  Yes Raylene Everts, MD  betamethasone valerate (VALISONE) 0.1 % cream APPLY A THIN LAYER TO THE AFFECTED AREA(S) BY TOPICAL ROUTE ONCE DAILY    [provider]  Cholecalciferol 25 MCG (1000 UT) tablet Take 1 tablet by mouth daily.    [provider]  esomeprazole (NEXIUM) 40 MG capsule Take 40 mg by mouth daily.     [provider]  estradiol (CLIMARA - DOSED IN MG/24 HR) 0.0375 mg/24hr patch Place 1 patch onto the skin 2 (two) times a week.     [provider]  LOTEMAX 0.5 % ophthalmic suspension Place 1 drop into the right eye every Wednesday. 10/09/17   [provider]  meclizine (ANTIVERT) 25 MG tablet Take 1 tablet by mouth daily as needed for dizziness. 03/02/20   [provider]  metoprolol succinate (TOPROL-XL) 25 MG 24 hr tablet TAKE 1 TABLET(25 MG) BY MOUTH DAILY 07/31/22   Allred, Jeneen Rinks, MD  prednisoLONE acetate (PRED FORTE) 1 % ophthalmic suspension Place 1 drop into both eyes once a week.    [provider]  sodium chloride (MURO 128) 5 % ophthalmic solution Place 1 drop into both eyes as needed for eye irritation.    [provider]    Family History Family History  Problem Relation Age of Onset   Skin cancer Father    Bladder Cancer Father    Skin cancer Brother    Skin cancer Paternal Grandfather    Colon polyps Brother    Colitis Mother    Colon cancer Neg Hx     Social History Social History   Tobacco Use   Smoking status: Never   Smokeless tobacco: Never  Vaping Use   Vaping Use: Never used  Substance Use Topics   Alcohol use: No   Drug use: No     Allergies   Aspirin and Demerol [meperidine hcl]   Review of Systems Review of Systems See HPI  Physical Exam Triage Vital Signs ED Triage Vitals  Enc Vitals Group     BP 12/13/22 1109 (!) 172/74     Pulse Rate 12/13/22 1109 84     Resp 12/13/22 1109 17     Temp 12/13/22 1109 98.5 F (36.9 C)      Temp Source 12/13/22 1109 Oral     SpO2 12/13/22 1109 98 %     Weight --      Height --      Head Circumference --      Peak Flow --      Pain Score 12/13/22 1110 0     Pain Loc --      Pain Edu? --  Excl. in GC? --    No data found.  Updated Vital Signs BP (!) 172/74 (BP Location: Right Arm)   Pulse 84   Temp 98.5 F (36.9 C) (Oral)   Resp 17   SpO2 98%      Physical Exam Constitutional:      General: She is not in acute distress.    Appearance: Normal appearance. She is well-developed.  HENT:     Head: Normocephalic and atraumatic.  Eyes:     Conjunctiva/sclera: Conjunctivae normal.     Pupils: Pupils are equal, round, and reactive to light.  Cardiovascular:     Rate and Rhythm: Normal rate.  Pulmonary:     Effort: Pulmonary effort is normal. No respiratory distress.     Breath sounds: Rales present.     Comments: Faint rales left base Abdominal:     General: There is no distension.     Palpations: Abdomen is soft.  Musculoskeletal:        General: Normal range of motion.     Cervical back: Normal range of motion.  Skin:    General: Skin is warm and dry.  Neurological:     Mental Status: She is alert.      UC Treatments / Results  Labs (all labs ordered are listed, but only abnormal results are displayed) Labs Reviewed - No data to display  EKG   Radiology DG Chest 2 View  Result Date: 12/13/2022 CLINICAL DATA:  Cough for more than 1 month.  Left-sided rales. EXAM: CHEST - 2 VIEW COMPARISON:  Radiographs 12/22/2021 and 12/29/2020. FINDINGS: The heart size and mediastinal contours are stable. There is stable minimal linear scarring or atelectasis at the left lung base. The lungs are otherwise clear. There is no pleural effusion or pneumothorax. No acute osseous findings are demonstrated status post multilevel lower cervical fusion. There is a mild thoracolumbar scoliosis. IMPRESSION: Stable chest. No acute cardiopulmonary process. Mild thoracolumbar  scoliosis. Electronically Signed   By: Richardean Sale M.D.   On: 12/13/2022 11:44    Procedures Procedures (including critical care time)  Medications Ordered in UC Medications - No data to display  Initial Impression / Assessment and Plan / UC Course  I have reviewed the triage vital signs and the nursing notes.  Pertinent labs & imaging results that were available during my care of the patient were reviewed by me and considered in my medical decision making (see chart for details).     Final Clinical Impressions(s) / UC Diagnoses   Final diagnoses:  Subacute cough     Discharge Instructions      Continue drinking lots of fluids Take the prednisone as directed May take Tessalon 2-3 times a day See your doctor if not improving in another week or 2   ED Prescriptions     Medication Sig Dispense Auth. Provider   benzonatate (TESSALON) 200 MG capsule Take 1 capsule (200 mg total) by mouth 3 (three) times daily as needed for cough. 21 capsule Raylene Everts, MD   predniSONE (DELTASONE) 20 MG tablet Take 1 tablet (20 mg total) by mouth 2 (two) times daily with a meal. 10 tablet Raylene Everts, MD   chlorpheniramine-HYDROcodone (TUSSIONEX) 10-8 MG/5ML Take 5 mLs by mouth every 12 (twelve) hours as needed for cough. 115 mL Raylene Everts, MD      PDMP not reviewed this encounter.   Raylene Everts, MD 12/13/22 1236

## 2022-12-13 NOTE — ED Notes (Signed)
Pt was discharged at 1207 - pt left OTF  to check on a prescription after pt had left

## 2022-12-27 DIAGNOSIS — L814 Other melanin hyperpigmentation: Secondary | ICD-10-CM | POA: Diagnosis not present

## 2022-12-27 DIAGNOSIS — Z85828 Personal history of other malignant neoplasm of skin: Secondary | ICD-10-CM | POA: Diagnosis not present

## 2022-12-27 DIAGNOSIS — E669 Obesity, unspecified: Secondary | ICD-10-CM | POA: Diagnosis not present

## 2022-12-27 DIAGNOSIS — L578 Other skin changes due to chronic exposure to nonionizing radiation: Secondary | ICD-10-CM | POA: Diagnosis not present

## 2022-12-27 DIAGNOSIS — C44519 Basal cell carcinoma of skin of other part of trunk: Secondary | ICD-10-CM | POA: Diagnosis not present

## 2022-12-27 DIAGNOSIS — L820 Inflamed seborrheic keratosis: Secondary | ICD-10-CM | POA: Diagnosis not present

## 2022-12-27 DIAGNOSIS — L821 Other seborrheic keratosis: Secondary | ICD-10-CM | POA: Diagnosis not present

## 2022-12-27 DIAGNOSIS — D485 Neoplasm of uncertain behavior of skin: Secondary | ICD-10-CM | POA: Diagnosis not present

## 2023-01-08 DIAGNOSIS — R0683 Snoring: Secondary | ICD-10-CM | POA: Diagnosis not present

## 2023-01-08 DIAGNOSIS — R413 Other amnesia: Secondary | ICD-10-CM | POA: Diagnosis not present

## 2023-01-22 DIAGNOSIS — C44519 Basal cell carcinoma of skin of other part of trunk: Secondary | ICD-10-CM | POA: Diagnosis not present

## 2023-01-25 DIAGNOSIS — M47812 Spondylosis without myelopathy or radiculopathy, cervical region: Secondary | ICD-10-CM | POA: Diagnosis not present

## 2023-01-25 DIAGNOSIS — Z6830 Body mass index (BMI) 30.0-30.9, adult: Secondary | ICD-10-CM | POA: Diagnosis not present

## 2023-02-05 DIAGNOSIS — M25572 Pain in left ankle and joints of left foot: Secondary | ICD-10-CM | POA: Diagnosis not present

## 2023-02-07 DIAGNOSIS — M47812 Spondylosis without myelopathy or radiculopathy, cervical region: Secondary | ICD-10-CM | POA: Diagnosis not present

## 2023-03-05 DIAGNOSIS — Z79899 Other long term (current) drug therapy: Secondary | ICD-10-CM | POA: Diagnosis not present

## 2023-03-05 DIAGNOSIS — E7800 Pure hypercholesterolemia, unspecified: Secondary | ICD-10-CM | POA: Diagnosis not present

## 2023-03-05 DIAGNOSIS — E559 Vitamin D deficiency, unspecified: Secondary | ICD-10-CM | POA: Diagnosis not present

## 2023-03-12 DIAGNOSIS — E559 Vitamin D deficiency, unspecified: Secondary | ICD-10-CM | POA: Diagnosis not present

## 2023-03-12 DIAGNOSIS — R42 Dizziness and giddiness: Secondary | ICD-10-CM | POA: Diagnosis not present

## 2023-03-12 DIAGNOSIS — Z9181 History of falling: Secondary | ICD-10-CM | POA: Diagnosis not present

## 2023-03-12 DIAGNOSIS — Z0000 Encounter for general adult medical examination without abnormal findings: Secondary | ICD-10-CM | POA: Diagnosis not present

## 2023-03-12 DIAGNOSIS — E7800 Pure hypercholesterolemia, unspecified: Secondary | ICD-10-CM | POA: Diagnosis not present

## 2023-03-12 DIAGNOSIS — E669 Obesity, unspecified: Secondary | ICD-10-CM | POA: Diagnosis not present

## 2023-03-12 DIAGNOSIS — Z79899 Other long term (current) drug therapy: Secondary | ICD-10-CM | POA: Diagnosis not present

## 2023-05-14 DIAGNOSIS — H40013 Open angle with borderline findings, low risk, bilateral: Secondary | ICD-10-CM | POA: Diagnosis not present

## 2023-05-14 DIAGNOSIS — H26492 Other secondary cataract, left eye: Secondary | ICD-10-CM | POA: Diagnosis not present

## 2023-06-11 DIAGNOSIS — M25551 Pain in right hip: Secondary | ICD-10-CM | POA: Diagnosis not present

## 2023-06-12 DIAGNOSIS — Z1331 Encounter for screening for depression: Secondary | ICD-10-CM | POA: Diagnosis not present

## 2023-06-12 DIAGNOSIS — R0683 Snoring: Secondary | ICD-10-CM | POA: Diagnosis not present

## 2023-06-12 DIAGNOSIS — Z13228 Encounter for screening for other metabolic disorders: Secondary | ICD-10-CM | POA: Diagnosis not present

## 2023-06-12 DIAGNOSIS — E6609 Other obesity due to excess calories: Secondary | ICD-10-CM | POA: Diagnosis not present

## 2023-06-12 DIAGNOSIS — Z6830 Body mass index (BMI) 30.0-30.9, adult: Secondary | ICD-10-CM | POA: Diagnosis not present

## 2023-07-01 DIAGNOSIS — G4710 Hypersomnia, unspecified: Secondary | ICD-10-CM | POA: Diagnosis not present

## 2023-07-02 DIAGNOSIS — L820 Inflamed seborrheic keratosis: Secondary | ICD-10-CM | POA: Diagnosis not present

## 2023-07-02 DIAGNOSIS — Z08 Encounter for follow-up examination after completed treatment for malignant neoplasm: Secondary | ICD-10-CM | POA: Diagnosis not present

## 2023-07-02 DIAGNOSIS — Z85828 Personal history of other malignant neoplasm of skin: Secondary | ICD-10-CM | POA: Diagnosis not present

## 2023-07-02 DIAGNOSIS — L578 Other skin changes due to chronic exposure to nonionizing radiation: Secondary | ICD-10-CM | POA: Diagnosis not present

## 2023-07-02 DIAGNOSIS — D239 Other benign neoplasm of skin, unspecified: Secondary | ICD-10-CM | POA: Diagnosis not present

## 2023-07-02 DIAGNOSIS — L821 Other seborrheic keratosis: Secondary | ICD-10-CM | POA: Diagnosis not present

## 2023-07-02 DIAGNOSIS — L814 Other melanin hyperpigmentation: Secondary | ICD-10-CM | POA: Diagnosis not present

## 2023-07-02 DIAGNOSIS — X32XXXS Exposure to sunlight, sequela: Secondary | ICD-10-CM | POA: Diagnosis not present

## 2023-07-18 DIAGNOSIS — E6609 Other obesity due to excess calories: Secondary | ICD-10-CM | POA: Diagnosis not present

## 2023-07-18 DIAGNOSIS — Z6831 Body mass index (BMI) 31.0-31.9, adult: Secondary | ICD-10-CM | POA: Diagnosis not present

## 2023-08-06 DIAGNOSIS — E6609 Other obesity due to excess calories: Secondary | ICD-10-CM | POA: Diagnosis not present

## 2023-08-06 DIAGNOSIS — Z6831 Body mass index (BMI) 31.0-31.9, adult: Secondary | ICD-10-CM | POA: Diagnosis not present

## 2023-08-12 DIAGNOSIS — E6609 Other obesity due to excess calories: Secondary | ICD-10-CM | POA: Diagnosis not present

## 2023-08-12 DIAGNOSIS — Z6830 Body mass index (BMI) 30.0-30.9, adult: Secondary | ICD-10-CM | POA: Diagnosis not present

## 2023-08-12 DIAGNOSIS — I10 Essential (primary) hypertension: Secondary | ICD-10-CM | POA: Diagnosis not present

## 2023-08-22 ENCOUNTER — Other Ambulatory Visit: Payer: Self-pay

## 2023-08-22 MED ORDER — METOPROLOL SUCCINATE ER 25 MG PO TB24: ORAL_TABLET | ORAL | 0 refills | Status: DC

## 2023-08-24 DIAGNOSIS — G4733 Obstructive sleep apnea (adult) (pediatric): Secondary | ICD-10-CM | POA: Diagnosis not present

## 2023-09-06 DIAGNOSIS — Z6830 Body mass index (BMI) 30.0-30.9, adult: Secondary | ICD-10-CM | POA: Diagnosis not present

## 2023-09-06 DIAGNOSIS — I491 Atrial premature depolarization: Secondary | ICD-10-CM | POA: Diagnosis not present

## 2023-09-06 DIAGNOSIS — M5136 Other intervertebral disc degeneration, lumbar region: Secondary | ICD-10-CM | POA: Diagnosis not present

## 2023-09-06 DIAGNOSIS — G4733 Obstructive sleep apnea (adult) (pediatric): Secondary | ICD-10-CM | POA: Diagnosis not present

## 2023-09-17 ENCOUNTER — Ambulatory Visit
Admission: RE | Admit: 2023-09-17 | Discharge: 2023-09-17 | Disposition: A | Discharge status: Home / Self Care | Payer: Medicare PPO | Source: Ambulatory Visit | Attending: Family Medicine | Admitting: Family Medicine

## 2023-09-17 VITALS — BP 141/85 | HR 68 | Temp 97.9°F | Resp 17

## 2023-09-17 DIAGNOSIS — R5383 Other fatigue: Secondary | ICD-10-CM | POA: Diagnosis not present

## 2023-09-17 DIAGNOSIS — R059 Cough, unspecified: Secondary | ICD-10-CM

## 2023-09-17 MED ORDER — BENZONATATE 200 MG PO CAPS: 200.0000 mg | ORAL_CAPSULE | Freq: Three times a day (TID) | ORAL | 0 refills | Status: AC | PRN

## 2023-09-17 MED ORDER — PREDNISONE 20 MG PO TABS: ORAL_TABLET | ORAL | 0 refills | Status: DC

## 2023-09-17 MED ORDER — HYDROCOD POLI-CHLORPHE POLI ER 10-8 MG/5ML PO SUER: 5.0000 mL | Freq: Two times a day (BID) | ORAL | 0 refills | Status: DC | PRN

## 2023-09-17 NOTE — ED Triage Notes (Addendum)
Pt c/o cough and fatigue since Sat night. Denies fever. OTC cough and cold meds. Rapid covid neg at home yesterday. Hx of bronchitis.

## 2023-09-17 NOTE — Discharge Instructions (Addendum)
Advised patient to take medication as directed with food to completion.  Advised may take Tessalon capsules daily or as needed for cough.  Advised may take Tussionex at night for cough due to sedate of effects.  Encouraged increase daily water intake to 64 ounces per day while taking these medications.  Advised we will follow-up with COVID-19 results once received.  Advised patient if symptoms worsen and/or unresolved please follow-up PCP or here for further evaluation.

## 2023-09-17 NOTE — ED Provider Notes (Signed)
[facility]    CSN: [id number] Arrival date & time: 09/17/23  1303      History   Chief Complaint Chief Complaint  Patient presents with   Cough   Fatigue    HPI [name] Stone is a 72 y.o. female.   HPI Very pleasant 72 year old female presents with cough and fatigue for 3 days.  Reports using OTC cough and cold remedies.  Patient reports negative home COVID-19 yesterday.Marland Kitchen  PMH significant for dysrhythmia (PACs), esophageal's stricture and previous BCC removed from arm years ago.  Past Medical History:  Diagnosis Date   Ankle fracture, left 2005   Arthritis    Cancer (HCC)    basal cell ca removed from arm   Complaints of memory disturbance    evaluated by Dr  [name]   Depression    Dysrhythmia    Has PACs   Esophageal stricture    Esophagitis    Family history of adverse reaction to anesthesia    MOTHER ALWAYS HAS N/V WITH SURGERY    GERD (gastroesophageal reflux disease)    History of environmental allergies    GETS HIVES OCCASIONALLY WHEN NOT USING JUICE PLUS PILLS    Pneumonia    years ago   Premature atrial contractions    followed by DR Donnie Aho     Patient Active Problem List   Diagnosis Date Noted   Arthrofibrosis of knee joint, right 06/11/2019   Hx of total knee arthroplasty, right 12/04/2017   Atypical chest pain    Mild cognitive impairment with memory loss 04/29/2013   Cervical spondylosis with radiculopathy 12/11/2011   FECAL OCCULT BLOOD 02/18/2009   ESOPHAGEAL STRICTURE 02/16/2009   GERD 02/16/2009   MENOPAUSE, SURGICAL 02/24/2008   SKIN CANCER, HX OF 02/24/2008   Personal history of other diseases of digestive system 02/24/2008    Past Surgical History:  Procedure Laterality Date   ABDOMINAL HYSTERECTOMY  1998   ANTERIOR CERVICAL DECOMP/DISCECTOMY FUSION  12/11/2011   Procedure: ANTERIOR CERVICAL DECOMPRESSION/DISCECTOMY FUSION 2 LEVELS;  Surgeon: Stefani Dama;  Location: [facility];  Service: Neurosurgery;  Laterality:  N/A;  C5-6 C6-7 Anterior cervical decompression/diskectomy fusion   ANTERIOR FUSION CERVICAL SPINE  2000   COLONOSCOPY     every 5 years for FH colon polyps   CORNEAL TRANSPLANT     ESOPHAGEAL MANOMETRY N/A 05/25/2016   Procedure: ESOPHAGEAL MANOMETRY (EM);  Surgeon: Ruffin Frederick, MD;  Location: [facility];  Service: Gastroenterology;  Laterality: N/A;   EYE SURGERY     x 5 right eye   INCONTINENCE SURGERY     KNEE ARTHROSCOPY Right 2015   KNEE ARTHROSCOPY W/ LATERAL RELEASE Right    KNEE ARTHROSCOPY W/ MENISCAL REPAIR Left    KNEE CLOSED REDUCTION Right 02/10/2018   Procedure: CLOSED MANIPULATION RIGHT KNEE;  Surgeon: Ranee Gosselin, MD;  Location: [facility];  Service: Orthopedics;  Laterality: Right;   NISSEN FUNDOPLICATION  04/24/2017   SCAR DEBRIDEMENT OF TOTAL KNEE Right 06/11/2019   Procedure: Irrigation and debridement excision of scar right total knee arthroplasty, with  poly revision;  Surgeon: Durene Romans, MD;  Location: [facility];  Service: Orthopedics;  Laterality: Right;  90 mins   TOTAL KNEE ARTHROPLASTY Right 12/04/2017   Procedure: RIGHT TOTAL KNEE ARTHROPLASTY;  Surgeon: Ranee Gosselin, MD;  Location: [facility];  Service: Orthopedics;  Laterality: Right;    OB History   No obstetric history on file.      Home Medications  Prior to Admission medications   Medication Sig Start Date End Date Taking? Authorizing Provider  benzonatate (TESSALON) 200 MG capsule Take 1 capsule (200 mg total) by mouth 3 (three) times daily as needed for up to 7 days. 09/17/23 09/24/23 Yes Trevor Iha, FNP  chlorpheniramine-HYDROcodone (TUSSIONEX) 10-8 MG/5ML Take 5 mLs by mouth every 12 (twelve) hours as needed for cough. 09/17/23  Yes Trevor Iha, FNP  predniSONE (DELTASONE) 20 MG tablet Take 3 tabs PO daily x 5 days. 09/17/23  Yes Trevor Iha, FNP  betamethasone valerate (VALISONE) 0.1 % cream APPLY A THIN LAYER TO THE AFFECTED AREA(S) BY TOPICAL ROUTE ONCE DAILY    [provider]  Cholecalciferol 25 MCG (1000 UT) tablet Take 1 tablet by mouth daily.    [provider]  esomeprazole (NEXIUM) 40 MG capsule Take 40 mg by mouth daily.     [provider]  estradiol (CLIMARA - DOSED IN MG/24 HR) 0.0375 mg/24hr patch Place 1 patch onto the skin 2 (two) times a week.     [provider]  LOTEMAX 0.5 % ophthalmic suspension Place 1 drop into the right eye every Wednesday. 10/09/17   [provider]  meclizine (ANTIVERT) 25 MG tablet Take 1 tablet by mouth daily as needed for dizziness. 03/02/20   [provider]  metoprolol succinate (TOPROL-XL) 25 MG 24 hr tablet TAKE 1 TABLET(25 MG) BY MOUTH DAILY 08/22/23   Chilton Si, MD  prednisoLONE acetate (PRED FORTE) 1 % ophthalmic suspension Place 1 drop into both eyes once a week.    [provider]  sodium chloride (MURO 128) 5 % ophthalmic solution Place 1 drop into both eyes as needed for eye irritation.    [provider]    Family History Family History  Problem Relation Age of Onset   Skin cancer Father    Bladder Cancer Father    Skin cancer Brother    Skin cancer Paternal Grandfather    Colon polyps Brother    Colitis Mother    Colon cancer Neg Hx     Social History Social History   Tobacco Use   Smoking status: Never   Smokeless tobacco: Never  Vaping Use   Vaping status: Never Used  Substance Use Topics   Alcohol use: No   Drug use: No     Allergies   Aspirin and Demerol [meperidine hcl]   Review of Systems Review of Systems  Respiratory:  Positive for cough.   All other systems reviewed and are negative.    Physical Exam Triage Vital Signs ED Triage Vitals  Encounter Vitals Group     BP      Systolic BP Percentile      Diastolic BP Percentile      Pulse      Resp      Temp      Temp src      SpO2      Weight      Height      Head Circumference      Peak Flow      Pain Score      Pain Loc      Pain  Education      Exclude from Growth Chart    No data found.  Updated Vital Signs BP (!) 141/85 (BP Location: Right Arm)   Pulse 68   Temp 97.9 F (36.6 C) (Oral)   Resp 17   SpO2 96%     Physical  Exam Vitals and nursing note reviewed.  Constitutional:      Appearance: Normal appearance. She is obese. She is ill-appearing.  HENT:     Head: Normocephalic and atraumatic.     Right Ear: Tympanic membrane, ear canal and external ear normal.     Left Ear: Tympanic membrane, ear canal and external ear normal.     Nose:     Comments: Turbinates are erythematous/edematous    Mouth/Throat:     Mouth: Mucous membranes are moist.     Pharynx: Oropharynx is clear.  Eyes:     Extraocular Movements: Extraocular movements intact.     Conjunctiva/sclera: Conjunctivae normal.     Pupils: Pupils are equal, round, and reactive to light.  Cardiovascular:     Rate and Rhythm: Normal rate and regular rhythm.     Pulses: Normal pulses.     Heart sounds: Normal heart sounds. No murmur heard.    No friction rub. No gallop.  Pulmonary:     Effort: Pulmonary effort is normal.     Breath sounds: Normal breath sounds. No wheezing, rhonchi or rales.     Comments: Infrequent nonproductive cough noted on exam Musculoskeletal:        General: Normal range of motion.     Cervical back: Normal range of motion and neck supple.  Skin:    General: Skin is warm and dry.  Neurological:     General: No focal deficit present.     Mental Status: She is alert and oriented to person, place, and time.  Psychiatric:        Mood and Affect: Mood normal.        Behavior: Behavior normal.        Thought Content: Thought content normal.      UC Treatments / Results  Labs (all labs ordered are listed, but only abnormal results are displayed) Labs Reviewed  SARS CORONAVIRUS 2 (TAT 6-24 HRS)    EKG   Radiology No results found.  Procedures Procedures (including critical care time)  Medications  Ordered in UC Medications - No data to display  Initial Impression / Assessment and Plan / UC Course  I have reviewed the triage vital signs and the nursing notes.  Pertinent labs & imaging results that were available during my care of the patient were reviewed by me and considered in my medical decision making (see chart for details).     MDM: 1.  Cough, unspecified type-Rx'd Prednisone 60 mg daily x 5 days, Tessalon 200 mg capsules 3 times daily, as needed for cough, Tussionex 10-8 mg /5 mL: Take 5 mL every 12 hours as needed for cough; 2.  Fatigue, unspecified type-SARS coronavirus 2 (TAT 6-24 HRS) ordered. Advised patient to take medication as directed with food to completion.  Advised may take Tessalon capsules daily or as needed for cough.  Advised may take Tussionex at night for cough due to sedate of effects.  Encouraged increase daily water intake to 64 ounces per day while taking these medications.  Advised we will follow-up with COVID-19 results once received.  Advised patient if symptoms worsen and/or unresolved please follow-up PCP or here for further evaluation.  Patient discharged home, hemodynamically stable.  Final Clinical Impressions(s) / UC Diagnoses   Final diagnoses:  Cough, unspecified type  Fatigue, unspecified type     Discharge Instructions      Advised patient to take medication as directed with food to completion.  Advised may take Tessalon capsules daily  or as needed for cough.  Advised may take Tussionex at night for cough due to sedate of effects.  Encouraged increase daily water intake to 64 ounces per day while taking these medications.  Advised we will follow-up with COVID-19 results once received.  Advised patient if symptoms worsen and/or unresolved please follow-up PCP or here for further evaluation.     ED Prescriptions     Medication Sig Dispense Auth. Provider   predniSONE (DELTASONE) 20 MG tablet Take 3 tabs PO daily x 5 days. 15 tablet Trevor Iha, FNP   benzonatate (TESSALON) 200 MG capsule Take 1 capsule (200 mg total) by mouth 3 (three) times daily as needed for up to 7 days. 40 capsule Trevor Iha, FNP   chlorpheniramine-HYDROcodone (TUSSIONEX) 10-8 MG/5ML Take 5 mLs by mouth every 12 (twelve) hours as needed for cough. 115 mL Trevor Iha, FNP      I have reviewed the PDMP during this encounter.   Trevor Iha, FNP 09/17/23 1345

## 2023-09-18 ENCOUNTER — Encounter (HOSPITAL_BASED_OUTPATIENT_CLINIC_OR_DEPARTMENT_OTHER): Payer: Self-pay | Admitting: Cardiovascular Disease

## 2023-09-18 ENCOUNTER — Ambulatory Visit (HOSPITAL_BASED_OUTPATIENT_CLINIC_OR_DEPARTMENT_OTHER): Payer: Medicare PPO | Admitting: Cardiovascular Disease

## 2023-09-18 VITALS — BP 126/74 | HR 57 | Ht 65.0 in | Wt 181.1 lb

## 2023-09-18 DIAGNOSIS — I493 Ventricular premature depolarization: Secondary | ICD-10-CM | POA: Diagnosis not present

## 2023-09-18 DIAGNOSIS — I491 Atrial premature depolarization: Secondary | ICD-10-CM

## 2023-09-18 DIAGNOSIS — G4733 Obstructive sleep apnea (adult) (pediatric): Secondary | ICD-10-CM

## 2023-09-18 HISTORY — DX: Atrial premature depolarization: I49.1

## 2023-09-18 HISTORY — DX: Obstructive sleep apnea (adult) (pediatric): G47.33

## 2023-09-18 MED ORDER — METOPROLOL SUCCINATE ER 25 MG PO TB24: ORAL_TABLET | ORAL | 4 refills | Status: DC

## 2023-09-18 NOTE — Patient Instructions (Signed)
Medication Instructions:  Your physician recommends that you continue on your current medications as directed. Please refer to the Current Medication list given to you today.  *If you need a refill on your cardiac medications before your next appointment, please call your pharmacy*   Follow-Up: At [facility], you and your health needs are our priority.  As part of our continuing mission to provide you with exceptional heart care, we have created designated Provider Care Teams.  These Care Teams include your primary Cardiologist (physician) and Advanced Practice Providers (APPs -  Physician Assistants and Nurse Practitioners) who all work together to provide you with the care you need, when you need it.  We recommend signing up for the patient portal called "MyChart".  Sign up information is provided on this After Visit Summary.  MyChart is used to connect with patients for Virtual Visits (Telemedicine).  Patients are able to view lab/test results, encounter notes, upcoming appointments, etc.  Non-urgent messages can be sent to your provider as well.   To learn more about what you can do with MyChart, go to [email or web address].    Your next appointment:   1 year(s)  Provider:   Chilton Si, MD

## 2023-09-18 NOTE — Progress Notes (Signed)
Cardiology Office Note:  .    Date:  09/18/2023  ID:  [name] Stone, DOB 1951-05-10, MRN [id number] PCP: Daisy Floro, MD   HeartCare Providers Cardiologist:  Hillis Range, MD (Inactive)     History of Present Illness: .    [name] Stone is a 72 y.o. female with PACs, GERD, arthritis, sleep apnea, who presents for follow-up and to establish care with me. She is formerly a patient of Dr. Johney Frame, last seen by him 06/25/2022 where she was doing well. Her palpitations (felt to be secondary to PACs) were resolved, had been previously worsened by steroids for hip/back issues. She had a prior echo 05/2021 revealing LVEF 65-70%, mild LVH, grade 1 diastolic dysfunction, mild mitral valve regurgitation. She had worn a monitor that same month showing sinus rhythm with occasional PACs and PVCs (burden <1%), rare ideoventricular rhythm. No atrial fibrillation or sustained arrhythmias.  Today, she states she is feeling well. However, she has been more fatigued during the day. She was recently diagnosed with sleep apnea and is pending CPAP fitting/titration. In the past, she notes that she usually started feeling weak when her heart rates became elevated. Lately she considers her palpitations to be quiescent and well managed. Additionally she complains of some dizziness. She believes that she may soon need repeat vestibular physical therapy. She has successfully lost some weight and has been working with a dietician to improve her protein intake. She has been losing several lbs a month since June. She is also working with a Systems analyst 2 days a week, for 45 minute sessions. She also goes walking for an hour on other days. Was walking up to 3-4 miles prior to a recent left ankle injury which has healed well. She is working on returning to her usual exercise routines, with a goal of walking 3 days a week outside of the days she works with a Systems analyst. She denies any chest pain, shortness of  breath, peripheral edema, headaches, syncope, orthopnea, or PND.  ROS:  Please see the history of present illness. All other systems are reviewed and negative.  (+) Fatigue (+) Dizziness  Studies Reviewed: Marland Kitchen   EKG Interpretation Date/Time:  Wednesday September 18 2023 10:16:43 EDT Ventricular Rate:  60 PR Interval:  144 QRS Duration:  80 QT Interval:  406 QTC Calculation: 406 R Axis:   59  Text Interpretation: Normal sinus rhythm Normal ECG When compared with ECG of 03-Jun-2019 16:48, Premature supraventricular complexes are no longer Present Confirmed by Chilton Si ([id number]) on 09/18/2023 10:30:52 AM    Monitor 05/2021: Sinus rhythm Occasional premature atrial contractions and premature ventricular contractiosn (Burden <1%) Patient had a min HR of 49 bpm, max HR of 157 bpm, and avg HR of 91 bpm.  Rare ideoventricular rhythm is noted No atrial fibrillation No sustained arrhythmias   Patch Wear Time:  13 days and 9 hours (2022-06-17T22:24:14-0400 to 2022-07-01T08:20:51-0400)  Echo  06/14/2021:  1. Left ventricular ejection fraction, by estimation, is 65 to 70%. The  left ventricle has normal function. The left ventricle has no regional  wall motion abnormalities. There is mild asymmetric left ventricular  hypertrophy of the basal-septal segment.  Left ventricular diastolic parameters are consistent with Grade I  diastolic dysfunction (impaired relaxation).   2. Right ventricular systolic function is normal. The right ventricular  size is normal.   3. The mitral valve is abnormal. Mild mitral valve regurgitation.   4. The aortic valve is tricuspid. Aortic valve regurgitation  is not  visualized.   Comparison(s): No prior Echocardiogram.   Risk Assessment/Calculations:             Physical Exam:    VS:  BP 126/74 (BP Location: Left Arm, Patient Position: Sitting, Cuff Size: Large)   Pulse (!) 57   Ht 5\' 5"  (1.651 m)   Wt 181 lb 1.6 oz (82.1 kg)   BMI 30.14 kg/m  ,  BMI Body mass index is 30.14 kg/m. GENERAL:  Well appearing HEENT: Pupils equal round and reactive, fundi not visualized, oral mucosa unremarkable NECK:  No jugular venous distention, waveform within normal limits, carotid upstroke brisk and symmetric, no bruits, no thyromegaly LUNGS:  Clear to auscultation bilaterally HEART:  RRR.  PMI not displaced or sustained,S1 and S2 within normal limits, no S3, no S4, no clicks, no rubs, no murmurs ABD:  Flat, positive bowel sounds normal in frequency in pitch, no bruits, no rebound, no guarding, no midline pulsatile mass, no hepatomegaly, no splenomegaly EXT:  2 plus pulses throughout, no edema, no cyanosis no clubbing SKIN:  No rashes no nodules NEURO:  Cranial nerves II through XII grossly intact, motor grossly intact throughout PSYCH:  Cognitively intact, oriented to person place and time  Wt Readings from Last 3 Encounters:  09/18/23 181 lb 1.6 oz (82.1 kg)  06/25/22 186 lb 9.6 oz (84.6 kg)  12/22/21 182 lb (82.6 kg)     ASSESSMENT AND PLAN: .    # Sleep Apnea Newly diagnosed with low nocturnal oxygen levels and bradycardia. Awaiting CPAP fitting. Reports daytime fatigue. -Continue with planned sleep study for CPAP fitting. -Consider elevating head during sleep for symptom relief.    # Obesity: Successful weight loss of 10 pounds since June through diet modification and exercise. Meeting with a dietician and personal trainer. -Continue current diet and exercise regimen.  # Premature Atrial and Ventricular Contractions (PACs and PVCs) Well controlled on Metoprolol. No reported palpitations. -Continue Metoprolol.  # Hyperlipidemia LDL controlled at 109 as of last check in March. -Continue current management.  # Vertigo Recent episode of vertigo, possibly related to known inter vestibular issue. -Consider balance therapy if symptoms persist.  Follow-up in 1 year.       Dispo:  FU with Armina Galloway C. Duke Salvia, MD, [facility] in 1  year.  I,Mathew Stumpf,acting as a Neurosurgeon for Chilton Si, MD.,have documented all relevant documentation on the behalf of Chilton Si, MD,as directed by  Chilton Si, MD while in the presence of Chilton Si, MD.  I, Adamarys Shall C. Duke Salvia, MD have reviewed all documentation for this visit.  The documentation of the exam, diagnosis, procedures, and orders on 09/18/2023 are all accurate and complete.   Signed, Chilton Si, MD

## 2023-09-23 DIAGNOSIS — R42 Dizziness and giddiness: Secondary | ICD-10-CM | POA: Diagnosis not present

## 2023-09-23 DIAGNOSIS — Z6830 Body mass index (BMI) 30.0-30.9, adult: Secondary | ICD-10-CM | POA: Diagnosis not present

## 2023-09-23 DIAGNOSIS — G4733 Obstructive sleep apnea (adult) (pediatric): Secondary | ICD-10-CM | POA: Diagnosis not present

## 2023-09-25 DIAGNOSIS — G4733 Obstructive sleep apnea (adult) (pediatric): Secondary | ICD-10-CM | POA: Diagnosis not present

## 2023-09-25 DIAGNOSIS — G4736 Sleep related hypoventilation in conditions classified elsewhere: Secondary | ICD-10-CM | POA: Diagnosis not present

## 2023-09-25 DIAGNOSIS — G4761 Periodic limb movement disorder: Secondary | ICD-10-CM | POA: Diagnosis not present

## 2023-09-25 DIAGNOSIS — R0902 Hypoxemia: Secondary | ICD-10-CM | POA: Diagnosis not present

## 2023-10-02 DIAGNOSIS — G4733 Obstructive sleep apnea (adult) (pediatric): Secondary | ICD-10-CM | POA: Diagnosis not present

## 2023-10-02 DIAGNOSIS — K219 Gastro-esophageal reflux disease without esophagitis: Secondary | ICD-10-CM | POA: Diagnosis not present

## 2023-10-02 DIAGNOSIS — I491 Atrial premature depolarization: Secondary | ICD-10-CM | POA: Diagnosis not present

## 2023-10-02 DIAGNOSIS — M51369 Other intervertebral disc degeneration, lumbar region without mention of lumbar back pain or lower extremity pain: Secondary | ICD-10-CM | POA: Diagnosis not present

## 2023-10-02 DIAGNOSIS — Z6830 Body mass index (BMI) 30.0-30.9, adult: Secondary | ICD-10-CM | POA: Diagnosis not present

## 2023-10-03 ENCOUNTER — Ambulatory Visit: Payer: Medicare PPO | Attending: Family Medicine | Admitting: Physical Therapy

## 2023-10-03 DIAGNOSIS — H8112 Benign paroxysmal vertigo, left ear: Secondary | ICD-10-CM | POA: Diagnosis present

## 2023-10-03 DIAGNOSIS — R262 Difficulty in walking, not elsewhere classified: Secondary | ICD-10-CM | POA: Diagnosis present

## 2023-10-03 DIAGNOSIS — R42 Dizziness and giddiness: Secondary | ICD-10-CM | POA: Diagnosis not present

## 2023-10-03 DIAGNOSIS — R2681 Unsteadiness on feet: Secondary | ICD-10-CM | POA: Diagnosis present

## 2023-10-03 NOTE — Therapy (Addendum)
OUTPATIENT PHYSICAL THERAPY VESTIBULAR EVALUATION     Patient Name: [name] Stone MRN: [id number] DOB:11/19/1951, 72 y.o., female Today's Date: 10/04/2023  END OF SESSION:  PT End of Session - 10/04/23 0930     Visit Number 1    Number of Visits 9    Date for PT Re-Evaluation 11/08/23   pushed out 1 week due to schedule availability   Authorization Type [employer]    Authorization Time Period 10-03-23 - 11-30-23    PT Start Time 0931    PT Stop Time 1015    PT Time Calculation (min) 44 min    Activity Tolerance Patient tolerated treatment well    Behavior During Therapy [facility] for tasks assessed/performed             Past Medical History:  Diagnosis Date   Ankle fracture, left 2005   Arthritis    Cancer (HCC)    basal cell ca removed from arm   Complaints of memory disturbance    evaluated by Dr  [name]   Depression    Dysrhythmia    Has PACs   Esophageal stricture    Esophagitis    Family history of adverse reaction to anesthesia    MOTHER ALWAYS HAS N/V WITH SURGERY    GERD (gastroesophageal reflux disease)    History of environmental allergies    GETS HIVES OCCASIONALLY WHEN NOT USING JUICE PLUS PILLS    OSA (obstructive sleep apnea) 09/18/2023   PAC (premature atrial contraction) 09/18/2023   Pneumonia    years ago   Premature atrial contractions    followed by DR Donnie Aho    Past Surgical History:  Procedure Laterality Date   ABDOMINAL HYSTERECTOMY  1998   ANTERIOR CERVICAL DECOMP/DISCECTOMY FUSION  12/11/2011   Procedure: ANTERIOR CERVICAL DECOMPRESSION/DISCECTOMY FUSION 2 LEVELS;  Surgeon: Stefani Dama;  Location: [facility];  Service: Neurosurgery;  Laterality: N/A;  C5-6 C6-7 Anterior cervical decompression/diskectomy fusion   ANTERIOR FUSION CERVICAL SPINE  2000   COLONOSCOPY     every 5 years for FH colon polyps   CORNEAL TRANSPLANT     ESOPHAGEAL MANOMETRY N/A 05/25/2016   Procedure: ESOPHAGEAL MANOMETRY (EM);  Surgeon: Ruffin Frederick, MD;  Location: [facility];  Service: Gastroenterology;  Laterality: N/A;   EYE SURGERY     x 5 right eye   INCONTINENCE SURGERY     KNEE ARTHROSCOPY Right 2015   KNEE ARTHROSCOPY W/ LATERAL RELEASE Right    KNEE ARTHROSCOPY W/ MENISCAL REPAIR Left    KNEE CLOSED REDUCTION Right 02/10/2018   Procedure: CLOSED MANIPULATION RIGHT KNEE;  Surgeon: Ranee Gosselin, MD;  Location: [facility];  Service: Orthopedics;  Laterality: Right;   NISSEN FUNDOPLICATION  04/24/2017   SCAR DEBRIDEMENT OF TOTAL KNEE Right 06/11/2019   Procedure: Irrigation and debridement excision of scar right total knee arthroplasty, with  poly revision;  Surgeon: Durene Romans, MD;  Location: [facility];  Service: Orthopedics;  Laterality: Right;  90 mins   TOTAL KNEE ARTHROPLASTY Right 12/04/2017   Procedure: RIGHT TOTAL KNEE ARTHROPLASTY;  Surgeon: Ranee Gosselin, MD;  Location: [facility];  Service: Orthopedics;  Laterality: Right;   Patient Active Problem List   Diagnosis Date Noted   PAC (premature atrial contraction) 09/18/2023   PVC (premature ventricular contraction) 09/18/2023   OSA (obstructive sleep apnea) 09/18/2023   Arthrofibrosis of knee joint, right 06/11/2019   Hx of total knee arthroplasty, right 12/04/2017   Atypical chest pain    Mild  cognitive impairment with memory loss 04/29/2013   Cervical spondylosis with radiculopathy 12/11/2011   FECAL OCCULT BLOOD 02/18/2009   ESOPHAGEAL STRICTURE 02/16/2009   GERD 02/16/2009   MENOPAUSE, SURGICAL 02/24/2008   SKIN CANCER, HX OF 02/24/2008   Personal history of other diseases of digestive system 02/24/2008    PCP: Daisy Floro, MD REFERRING PROVIDER: Daisy Floro, MD  REFERRING DIAG:  Diagnosis  R42 (ICD-10-CM) - Dizziness and giddiness    THERAPY DIAG:  BPPV (benign paroxysmal positional vertigo), left  Dizziness and giddiness  Difficulty in walking, not elsewhere classified  ONSET DATE: 09-12-23  Rationale for Evaluation and  Treatment: Rehabilitation  SUBJECTIVE:   SUBJECTIVE STATEMENT: Pt reports she had unsteadiness about 5-6 weeks ago (end of August); has been diagnosed with sleep apnea.  Has noticed she had heavy head feeling when she woke up in the morning.  Flew to [place] on 09-10-23 and then had episode of vertigo on evening of 09-12-23 when she looked at her phone for ordering a meal out.  Felt spinning vertigo last on 09-12-23; still having imbalance with having to touch wall and having heavy head feeling - happens throughout the day. Took Meclizine week of 09-12-23 but hasn't taken it in over a week. Has been having some HA and nausea when she wakes up but thinks it is due to sleep apnea.  Will be starting Cpap machine next week hopefully. Pt accompanied by: self  PERTINENT HISTORY: h/o anterior fusion of cervical spine 2000, cervical spondylosis with radiculopathy, h/o PAC, PVC; recently diagnosed with OSA with pt reporting getting CPAP machine in near future: h/o BPPV/imbalance with treatment at this facility in Nov. 2022 - Feb. 2023  PAIN:  Are you having pain? No  PRECAUTIONS: None  RED FLAGS: None   WEIGHT BEARING RESTRICTIONS: No  FALLS: Has patient fallen in last 6 months? 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  DIAGNOSTIC FINDINGS: N/A  COGNITION: Overall cognitive status:  pt reports some mild impairments (memory) which may be related to oxygen deprivation due to OSA  POSTURE:  No Significant postural limitations  Cervical ROM:  limited in active extension due to s/p cervical fusion   STRENGTH: WNL's bil. UE's & LE's  LOWER EXTREMITY MMT: WNL's  BED MOBILITY:  Independent  TRANSFERS: Assistive  device utilized: None  Sit to stand: Complete Independence Stand to sit: Complete Independence  GAIT: Gait pattern: WFL Distance walked: 58' Assistive device utilized: None Level of assistance: Complete Independence  PATIENT SURVEYS:  FOTO Not captured by front office at eval - will be completed next session  VESTIBULAR ASSESSMENT:  GENERAL OBSERVATION: pt is a 72 yr old lady amb. Independently without device but has unsteadiness upon initial sit to stand and with initiating steps for ambulation   SYMPTOM BEHAVIOR:  Subjective history: Pt reports some imbalance started about end of August with episode of vertigo occurring on 09-12-23 when she was in [place] for a conference; states she looked down at her phone in the evening and suddenly had room spinning vertigo  Non-Vestibular symptoms: neck pain, headaches, nausea/vomiting, and cervical fusion  Type of dizziness: Imbalance (Disequilibrium), Spinning/Vertigo, and Unsteady with head/body turns  Frequency: daily;   Duration: mostly all day  Aggravating  factors: Induced by position change: rolling to the left, Induced by motion: occur when walking, bending down to the ground, turning body quickly, and turning head quickly, and Occurs when standing still   Relieving factors: lying supine and rest  Progression of symptoms: unchanged  OCULOMOTOR EXAM:  Ocular Alignment: normal  Ocular ROM: No Limitations  Spontaneous Nystagmus: absent  Gaze-Induced Nystagmus: absent  Smooth Pursuits: intact  Saccades: intact and reports Almost  blind with going down to see object below   FRENZEL - FIXATION SUPRESSED:  Ocular Alignment: normal  Spontaneous Nystagmus: absent  Gaze-Induced Nystagmus: absent  Positional tests: Right Dix-Hallpike: no nystagmus Left Dix-Hallpike: upbeating, left nystagmus Right Sidelying: no nystagmus Left Sidelying: no nystagmus   VESTIBULAR - OCULAR REFLEX: NT      POSITIONAL TESTING:    Right Dix-Hallpike: no  nystagmus Left Dix-Hallpike: upbeating, left nystagmus Right Sidelying: no nystagmus but dizziness reported with return to upright Left Sidelying: no nystagmus but dizziness reported with return to upright  MOTION SENSITIVITY:  Motion Sensitivity Quotient Intensity: 0 = none, 1 = Lightheaded, 2 = Mild, 3 = Moderate, 4 = Severe, 5 = Vomiting  Intensity  1. Sitting to supine   2. Supine to L side   3. Supine to R side   4. Supine to sitting   5. L Hallpike-Dix   6. Up from L  3  7. R Hallpike-Dix   8. Up from R  4  9. Sitting, head tipped to L knee   10. Head up from L knee   11. Sitting, head tipped to R knee   12. Head up from R knee   13. Sitting head turns x5   14.Sitting head nods x5   15. In stance, 180 turn to L    16. In stance, 180 turn to R    Up from Rt sidelying - mod. dizziness            Up from Lt sidelying - mod. dizziness  Smooth pursuits 1/10 horizontal - no nystagmus Vertical -  reports almost diplopia with tracking down     FUNCTIONAL GAIT:  TBA when BPPV resolved   VESTIBULAR TREATMENT:                                                                                                   DATE: 10-03-23  Canalith Repositioning:  Epley Left: Number of Reps: 2, Response to Treatment: symptoms improved, and Comment: not fully resolved as nystagmus present in position 1 but less intense compared to 1st rep of Epley - 2 reps only completed due to time constraint  PATIENT EDUCATION: Education details: pt given article from [facility] on BPPV etiology; instructed in Brandt-Daroff exercises for HEP for habituation Person educated: Patient Education method: Explanation, Demonstration, and Handouts Education comprehension: verbalized understanding and needs further education  HOME EXERCISE PROGRAM: Brandt-Daroff exercise for Rt BPPV  GOALS: Goals reviewed with patient? Yes  SHORT TERM GOALS: same as LTG's as ELOS = 4 weeks   LONG TERM GOALS: Target date: 11-01-23    Pt will have a (-)  Lt Dix-Hallpike test with no nystagmus and no c/o vertigo in test position to indicate resolution of Lt BPPV. Baseline: (+) Lt Dix-Hallpike test Goal status: INITIAL  2.  Improve FGA score by at least 4 points to demo improvement in balance. Baseline: TBA Goal status: INITIAL  3.  Complete FOTO and set LTG as appropriate. Baseline:  Goal status: INITIAL  4.  Pt will perform all bed mobility without c/o vertigo. Baseline:  Goal status: INITIAL  5.  Independent in HEP for vestibular exercises including balance and habituation. Baseline:  Goal status: INITIAL   ASSESSMENT:  CLINICAL IMPRESSION: Patient is a 72 y.o. lady who was seen today for physical therapy evaluation and treatment for Lt BPPV posterior canalithiasis.  Pt had (+) Lt Dix-Hallpike test with Lt rotary nystagmus indicative of Lt BPPV.  Pt has unsteadiness upon initial sit to stand transfers and with turns during ambulation.  Pt will benefit from PT to address Lt BPPV and balance and dynamic gait training when BPPV has resolved.    OBJECTIVE IMPAIRMENTS: decreased balance, difficulty walking, and dizziness.   ACTIVITY LIMITATIONS: bending, stairs, transfers, bed mobility, and locomotion level  PARTICIPATION LIMITATIONS: cleaning, laundry, driving, shopping, and community activity  PERSONAL FACTORS: Past/current experiences are also affecting patient's functional outcome.   REHAB POTENTIAL: Good  CLINICAL DECISION MAKING: Stable/uncomplicated  EVALUATION COMPLEXITY: Low   PLAN:  PT FREQUENCY: 2x/week  PT DURATION: 4 weeks  PLANNED INTERVENTIONS: Therapeutic exercises, Therapeutic activity, Neuromuscular re-education, Balance training, Gait training, Patient/Family education, Self Care, Vestibular training, and Canalith repositioning  PLAN FOR NEXT SESSION: recheck Lt BPPV; do FOTO, FGA, mCTSIB if BPPV resolved   Kary Kos, PT 10/04/2023, 9:34 AM

## 2023-10-03 NOTE — Patient Instructions (Signed)
Sit to Side-Lying    Sit on edge of bed. 1. Turn head 45 to right. 2. Maintain head position and lie down slowly on left side. Hold until symptoms subside. 3. Sit up slowly. Hold until symptoms subside. 4. Turn head 45 to left. 5. Maintain head position and lie down slowly on right side. Hold until symptoms subside then additional 20-30- secs. 6. Sit up slowly.  Repeat sequence __5_ times per session. Do _2___ sessions per day.  Copyright  VHI. All rights reserved.

## 2023-10-04 ENCOUNTER — Encounter: Payer: Self-pay | Admitting: Physical Therapy

## 2023-10-07 ENCOUNTER — Ambulatory Visit: Payer: Medicare PPO | Admitting: Physical Therapy

## 2023-10-07 ENCOUNTER — Encounter: Payer: Self-pay | Admitting: Physical Therapy

## 2023-10-07 DIAGNOSIS — R262 Difficulty in walking, not elsewhere classified: Secondary | ICD-10-CM | POA: Diagnosis not present

## 2023-10-07 DIAGNOSIS — H8112 Benign paroxysmal vertigo, left ear: Secondary | ICD-10-CM

## 2023-10-07 DIAGNOSIS — R42 Dizziness and giddiness: Secondary | ICD-10-CM

## 2023-10-07 DIAGNOSIS — R2681 Unsteadiness on feet: Secondary | ICD-10-CM | POA: Diagnosis not present

## 2023-10-07 NOTE — Patient Instructions (Signed)
Gaze Stabilization: Tip Card  1.Target must remain in focus, not blurry, and appear stationary while head is in motion. 2.Perform exercises with small head movements (45 to either side of midline). 3.Increase speed of head motion so long as target is in focus. 4.If you wear eyeglasses, be sure you can see target through lens (therapist will give specific instructions for bifocal / progressive lenses). 5.These exercises may provoke dizziness or nausea. Work through these symptoms. If too dizzy, slow head movement slightly. Rest between each exercise. 6.Exercises demand concentration; avoid distractions. 7.For safety, perform standing exercises close to a counter, wall, corner, or next to someone.      Gaze Stabilization: Standing Feet Apart    Feet shoulder width apart, keeping eyes on target on wall __5__ feet away, tilt head down 15-30 and move head side to side for _60___ seconds. Repeat while moving head up and down for __60__ seconds. Do __3__ sessions per day. Repeat using target on pattern background.  Copyright  VHI. All rights reserved.

## 2023-10-07 NOTE — Therapy (Signed)
OUTPATIENT PHYSICAL THERAPY VESTIBULAR TREATMENT NOTE     Patient Name: [name] Stone MRN: [id number] DOB:08-May-1951, 72 y.o., female Today's Date: 10/07/2023  END OF SESSION:  PT End of Session - 10/07/23 1022     Visit Number 2    Number of Visits 9    Date for PT Re-Evaluation 11/08/23   pushed out 1 week due to schedule availability   Authorization Type [employer]    Authorization Time Period 10-03-23 - 11-30-23    PT Start Time 0808   pt arrived late   PT Stop Time 0847    PT Time Calculation (min) 39 min    Activity Tolerance Patient tolerated treatment well    Behavior During Therapy [facility] for tasks assessed/performed             Past Medical History:  Diagnosis Date   Ankle fracture, left 2005   Arthritis    Cancer (HCC)    basal cell ca removed from arm   Complaints of memory disturbance    evaluated by Dr  [name]   Depression    Dysrhythmia    Has PACs   Esophageal stricture    Esophagitis    Family history of adverse reaction to anesthesia    MOTHER ALWAYS HAS N/V WITH SURGERY    GERD (gastroesophageal reflux disease)    History of environmental allergies    GETS HIVES OCCASIONALLY WHEN NOT USING JUICE PLUS PILLS    OSA (obstructive sleep apnea) 09/18/2023   PAC (premature atrial contraction) 09/18/2023   Pneumonia    years ago   Premature atrial contractions    followed by DR Donnie Aho    Past Surgical History:  Procedure Laterality Date   ABDOMINAL HYSTERECTOMY  1998   ANTERIOR CERVICAL DECOMP/DISCECTOMY FUSION  12/11/2011   Procedure: ANTERIOR CERVICAL DECOMPRESSION/DISCECTOMY FUSION 2 LEVELS;  Surgeon: Stefani Dama;  Location: [facility];  Service: Neurosurgery;  Laterality: N/A;  C5-6 C6-7 Anterior cervical decompression/diskectomy fusion   ANTERIOR FUSION CERVICAL SPINE  2000   COLONOSCOPY     every 5 years for FH colon polyps   CORNEAL TRANSPLANT     ESOPHAGEAL MANOMETRY N/A 05/25/2016   Procedure: ESOPHAGEAL MANOMETRY (EM);  Surgeon:  Ruffin Frederick, MD;  Location: [facility];  Service: Gastroenterology;  Laterality: N/A;   EYE SURGERY     x 5 right eye   INCONTINENCE SURGERY     KNEE ARTHROSCOPY Right 2015   KNEE ARTHROSCOPY W/ LATERAL RELEASE Right    KNEE ARTHROSCOPY W/ MENISCAL REPAIR Left    KNEE CLOSED REDUCTION Right 02/10/2018   Procedure: CLOSED MANIPULATION RIGHT KNEE;  Surgeon: Ranee Gosselin, MD;  Location: [facility];  Service: Orthopedics;  Laterality: Right;   NISSEN FUNDOPLICATION  04/24/2017   SCAR DEBRIDEMENT OF TOTAL KNEE Right 06/11/2019   Procedure: Irrigation and debridement excision of scar right total knee arthroplasty, with  poly revision;  Surgeon: Durene Romans, MD;  Location: [facility];  Service: Orthopedics;  Laterality: Right;  90 mins   TOTAL KNEE ARTHROPLASTY Right 12/04/2017   Procedure: RIGHT TOTAL KNEE ARTHROPLASTY;  Surgeon: Ranee Gosselin, MD;  Location: [facility];  Service: Orthopedics;  Laterality: Right;   Patient Active Problem List   Diagnosis Date Noted   PAC (premature atrial contraction) 09/18/2023   PVC (premature ventricular contraction) 09/18/2023   OSA (obstructive sleep apnea) 09/18/2023   Arthrofibrosis of knee joint, right 06/11/2019   Hx of total knee arthroplasty, right 12/04/2017   Atypical chest  pain    Mild cognitive impairment with memory loss 04/29/2013   Cervical spondylosis with radiculopathy 12/11/2011   FECAL OCCULT BLOOD 02/18/2009   ESOPHAGEAL STRICTURE 02/16/2009   GERD 02/16/2009   MENOPAUSE, SURGICAL 02/24/2008   SKIN CANCER, HX OF 02/24/2008   Personal history of other diseases of digestive system 02/24/2008    PCP: Daisy Floro, MD REFERRING PROVIDER: Daisy Floro, MD  REFERRING DIAG:  Diagnosis  R42 (ICD-10-CM) - Dizziness and giddiness    THERAPY DIAG:  BPPV (benign paroxysmal positional vertigo), left  Dizziness and giddiness  ONSET DATE: 09-12-23  Rationale for Evaluation and Treatment: Rehabilitation  SUBJECTIVE:    SUBJECTIVE STATEMENT: Pt reports she is a little better but is still having some imbalance when she turns quickly; has only done Brandt-Daroff exercises 1x/day (3 days)  since she was here last Thursday - is having difficulty remembering to do them - says she is going to start setting alarms to remind her to do them during the day Pt accompanied by: self  PERTINENT HISTORY: h/o anterior fusion of cervical spine 2000, cervical spondylosis with radiculopathy, h/o PAC, PVC; recently diagnosed with OSA with pt reporting getting CPAP machine in near future: h/o BPPV/imbalance with treatment at this facility in Nov. 2022 - Feb. 2023  PAIN:  Are you having pain? No  PRECAUTIONS: None  RED FLAGS: None   WEIGHT BEARING RESTRICTIONS: No  FALLS: Has patient fallen in last 6 months? 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  DIAGNOSTIC FINDINGS: N/A  COGNITION: Overall cognitive status:  pt reports some mild impairments (memory) which may be related to oxygen deprivation due to OSA  POSTURE:  No Significant postural limitations  Cervical ROM:  limited in active extension due to s/p cervical fusion   STRENGTH: WNL's bil. UE's & LE's  LOWER EXTREMITY MMT: WNL's  BED MOBILITY:  Independent  TRANSFERS: Assistive device utilized: None  Sit to stand: Complete Independence Stand to sit: Complete Independence  GAIT: Gait pattern: WFL Distance walked: 42' Assistive device utilized: None Level of assistance: Complete Independence  PATIENT SURVEYS:  FOTO Not captured by front office at eval - will be completed next session  VESTIBULAR ASSESSMENT:  GENERAL OBSERVATION: pt is a 72 yr old lady amb.  Independently without device but has unsteadiness upon initial sit to stand and with initiating steps for ambulation   SYMPTOM BEHAVIOR:  Subjective history: Pt reports some imbalance started about end of August with episode of vertigo occurring on 09-12-23 when she was in [place] for a conference; states she looked down at her phone in the evening and suddenly had room spinning vertigo  Non-Vestibular symptoms: neck pain, headaches, nausea/vomiting, and cervical fusion  Type of dizziness: Imbalance (Disequilibrium), Spinning/Vertigo, and Unsteady with head/body turns  Frequency: daily;   Duration: mostly all day  Aggravating factors: Induced by position change: rolling to the left, Induced by motion: occur when walking, bending down to the ground, turning body quickly, and turning head quickly, and Occurs when standing still   Relieving factors: lying supine and rest  Progression of symptoms: unchanged  OCULOMOTOR EXAM:  Ocular Alignment: normal  Ocular ROM: No Limitations  Spontaneous Nystagmus: absent  Gaze-Induced Nystagmus: absent  Smooth Pursuits: intact  Saccades: intact and reports Almost  blind with going down to see object below   FRENZEL - FIXATION SUPRESSED:  Ocular Alignment: normal  Spontaneous Nystagmus: absent  Gaze-Induced Nystagmus: absent  Positional tests: Right Dix-Hallpike: no nystagmus Left Dix-Hallpike: upbeating, left nystagmus Right Sidelying: no nystagmus Left Sidelying: no nystagmus   VESTIBULAR - OCULAR REFLEX: NT      POSITIONAL TESTING:    Right Dix-Hallpike: no nystagmus Left Dix-Hallpike: upbeating, left nystagmus Right Sidelying: no nystagmus but dizziness reported with return to upright Left Sidelying: no nystagmus but dizziness reported with return to upright  MOTION SENSITIVITY:  Motion Sensitivity Quotient Intensity: 0 = none, 1 = Lightheaded, 2 = Mild, 3 = Moderate, 4 = Severe, 5 = Vomiting  Intensity  1. Sitting to supine   2. Supine to  L side   3. Supine to R side   4. Supine to sitting   5. L Hallpike-Dix   6. Up from L  3  7. R Hallpike-Dix   8. Up from R  4  9. Sitting, head tipped to L knee   10. Head up from L knee   11. Sitting, head tipped to R knee   12. Head up from R knee   13. Sitting head turns x5   14.Sitting head nods x5   15. In stance, 180 turn to L    16. In stance, 180 turn to R    Up from Rt sidelying - mod. dizziness            Up from Lt sidelying - mod. dizziness  Smooth pursuits 1/10 horizontal - no nystagmus Vertical -  reports almost diplopia with tracking down     FUNCTIONAL GAIT:  TBA when BPPV resolved   VESTIBULAR TREATMENT:                                                                                                   DATE: 10-07-23  Lt Dix-Hallpike - (+) for 3-4 beats mild, low intensity, rotary nystagmus with approx. 4 sec delayed onset  Canalith Repositioning:  Epley Left: Number of Reps: 3, Response to Treatment: symptoms improved, and Comment: pt had no symptoms and no nystagmus noted on 3rd rep; pt did report mild headache at end of session  Pt instructed in x1 viewing exercise - target on plain background - 5' away- in standing; exercise was demonstrated to pt due to time constraint and also pt verbalizing familiarity with this exercise as performed in previous admission  HEP  Gaze Stabilization: Tip Card  1.Target must remain in focus, not blurry, and appear stationary while head is in motion. 2.Perform exercises with small head movements (45 to either side of midline). 3.Increase speed of head motion so long as target is in focus. 4.If you wear eyeglasses, be sure you can see target through lens (therapist will give specific instructions for bifocal / progressive lenses). 5.These exercises may provoke dizziness or nausea. Work through these symptoms. If too dizzy, slow head movement slightly. Rest between each exercise. 6.Exercises demand concentration; avoid  distractions. 7.For safety, perform standing  exercises close to a counter, wall, corner, or next to someone.    Gaze Stabilization: Standing Feet Apart    Feet shoulder width apart, keeping eyes on target on wall __5__ feet away, tilt head down 15-30 and move head side to side for __60__ seconds. Repeat while moving head up and down for _60___ seconds. Do _3___ sessions per day. Repeat using target on pattern background.   PATIENT EDUCATION: Education details:  x1 viewing exercise;  Person educated: Patient Education method: Programmer, multimedia, Demonstration, and Handouts Education comprehension: verbalized understanding and needs further education  HOME EXERCISE PROGRAM: Brandt-Daroff exercise for Rt BPPV;  issued x1 viewing exercise on 10-07-23  GOALS: Goals reviewed with patient? Yes  SHORT TERM GOALS: same as LTG's as ELOS = 4 weeks   LONG TERM GOALS: Target date: 11-01-23   Pt will have a (-) Lt Dix-Hallpike test with no nystagmus and no c/o vertigo in test position to indicate resolution of Lt BPPV. Baseline: (+) Lt Dix-Hallpike test Goal status: INITIAL  2.  Improve FGA score by at least 4 points to demo improvement in balance. Baseline: TBA Goal status: INITIAL  3.  Complete FOTO and set LTG as appropriate. Baseline:  Goal status: Deferred due to not captured on eval nor on 2nd visit  4.  Pt will perform all bed mobility without c/o vertigo. Baseline:  Goal status: INITIAL  5.  Independent in HEP for vestibular exercises including balance and habituation. Baseline:  Goal status: INITIAL   ASSESSMENT:  CLINICAL IMPRESSION: PT session focused on treatment of Lt BPPV posterior canalithiasis with 3 reps of Epley maneuver performed; no nystagmus and no c/o vertigo reported in any position of Epley maneuver on 3rd rep, indicative of resolution of Lt BPPV.  Issued x1 viewing for HEP (pt familiar with this exercise as it was issued in her previous OP admission).  Plan to  assess gait and balance when BPPV fully resolved.  Cont with POC.    OBJECTIVE IMPAIRMENTS: decreased balance, difficulty walking, and dizziness.   ACTIVITY LIMITATIONS: bending, stairs, transfers, bed mobility, and locomotion level  PARTICIPATION LIMITATIONS: cleaning, laundry, driving, shopping, and community activity  PERSONAL FACTORS: Past/current experiences are also affecting patient's functional outcome.   REHAB POTENTIAL: Good  CLINICAL DECISION MAKING: Stable/uncomplicated  EVALUATION COMPLEXITY: Low   PLAN:  PT FREQUENCY: 2x/week  PT DURATION: 4 weeks  PLANNED INTERVENTIONS: Therapeutic exercises, Therapeutic activity, Neuromuscular re-education, Balance training, Gait training, Patient/Family education, Self Care, Vestibular training, and Canalith repositioning  PLAN FOR NEXT SESSION: recheck Lt BPPV ; check x1 viewing ex issued for HEP for any questions/problems:  do FGA, mCTSIB if BPPV resolved & update HEP as appropriate   Rashid Whitenight, Donavan Burnet, PT 10/07/2023, 1:25 PM

## 2023-10-08 DIAGNOSIS — Z1231 Encounter for screening mammogram for malignant neoplasm of breast: Secondary | ICD-10-CM | POA: Diagnosis not present

## 2023-10-09 ENCOUNTER — Ambulatory Visit: Payer: Medicare PPO | Admitting: Physical Therapy

## 2023-10-14 ENCOUNTER — Ambulatory Visit: Payer: Medicare PPO | Admitting: Physical Therapy

## 2023-10-14 DIAGNOSIS — R2681 Unsteadiness on feet: Secondary | ICD-10-CM

## 2023-10-14 DIAGNOSIS — H8112 Benign paroxysmal vertigo, left ear: Secondary | ICD-10-CM

## 2023-10-14 DIAGNOSIS — R262 Difficulty in walking, not elsewhere classified: Secondary | ICD-10-CM | POA: Diagnosis not present

## 2023-10-14 DIAGNOSIS — R42 Dizziness and giddiness: Secondary | ICD-10-CM | POA: Diagnosis not present

## 2023-10-14 NOTE — Therapy (Unsigned)
OUTPATIENT PHYSICAL THERAPY VESTIBULAR TREATMENT NOTE     Patient Name: [name] Stone MRN: [id number] DOB:07-18-1951, 72 y.o., female Today's Date: 10/15/2023  END OF SESSION:  PT End of Session - 10/15/23 0900     Visit Number 3    Number of Visits 9    Date for PT Re-Evaluation 11/08/23   pushed out 1 week due to schedule availability   Authorization Type [employer]    Authorization Time Period 10-03-23 - 11-30-23    PT Start Time 0805    PT Stop Time 0846    PT Time Calculation (min) 41 min    Activity Tolerance Patient tolerated treatment well    Behavior During Therapy [facility] for tasks assessed/performed              Past Medical History:  Diagnosis Date   Ankle fracture, left 2005   Arthritis    Cancer (HCC)    basal cell ca removed from arm   Complaints of memory disturbance    evaluated by Dr  [name]   Depression    Dysrhythmia    Has PACs   Esophageal stricture    Esophagitis    Family history of adverse reaction to anesthesia    MOTHER ALWAYS HAS N/V WITH SURGERY    GERD (gastroesophageal reflux disease)    History of environmental allergies    GETS HIVES OCCASIONALLY WHEN NOT USING JUICE PLUS PILLS    OSA (obstructive sleep apnea) 09/18/2023   PAC (premature atrial contraction) 09/18/2023   Pneumonia    years ago   Premature atrial contractions    followed by DR Donnie Aho    Past Surgical History:  Procedure Laterality Date   ABDOMINAL HYSTERECTOMY  1998   ANTERIOR CERVICAL DECOMP/DISCECTOMY FUSION  12/11/2011   Procedure: ANTERIOR CERVICAL DECOMPRESSION/DISCECTOMY FUSION 2 LEVELS;  Surgeon: Stefani Dama;  Location: [facility];  Service: Neurosurgery;  Laterality: N/A;  C5-6 C6-7 Anterior cervical decompression/diskectomy fusion   ANTERIOR FUSION CERVICAL SPINE  2000   COLONOSCOPY     every 5 years for FH colon polyps   CORNEAL TRANSPLANT     ESOPHAGEAL MANOMETRY N/A 05/25/2016   Procedure: ESOPHAGEAL MANOMETRY (EM);  Surgeon: Ruffin Frederick, MD;  Location: [facility];  Service: Gastroenterology;  Laterality: N/A;   EYE SURGERY     x 5 right eye   INCONTINENCE SURGERY     KNEE ARTHROSCOPY Right 2015   KNEE ARTHROSCOPY W/ LATERAL RELEASE Right    KNEE ARTHROSCOPY W/ MENISCAL REPAIR Left    KNEE CLOSED REDUCTION Right 02/10/2018   Procedure: CLOSED MANIPULATION RIGHT KNEE;  Surgeon: Ranee Gosselin, MD;  Location: [facility];  Service: Orthopedics;  Laterality: Right;   NISSEN FUNDOPLICATION  04/24/2017   SCAR DEBRIDEMENT OF TOTAL KNEE Right 06/11/2019   Procedure: Irrigation and debridement excision of scar right total knee arthroplasty, with  poly revision;  Surgeon: Durene Romans, MD;  Location: [facility];  Service: Orthopedics;  Laterality: Right;  90 mins   TOTAL KNEE ARTHROPLASTY Right 12/04/2017   Procedure: RIGHT TOTAL KNEE ARTHROPLASTY;  Surgeon: Ranee Gosselin, MD;  Location: [facility];  Service: Orthopedics;  Laterality: Right;   Patient Active Problem List   Diagnosis Date Noted   PAC (premature atrial contraction) 09/18/2023   PVC (premature ventricular contraction) 09/18/2023   OSA (obstructive sleep apnea) 09/18/2023   Arthrofibrosis of knee joint, right 06/11/2019   Hx of total knee arthroplasty, right 12/04/2017   Atypical chest pain  Mild cognitive impairment with memory loss 04/29/2013   Cervical spondylosis with radiculopathy 12/11/2011   FECAL OCCULT BLOOD 02/18/2009   ESOPHAGEAL STRICTURE 02/16/2009   GERD 02/16/2009   MENOPAUSE, SURGICAL 02/24/2008   SKIN CANCER, HX OF 02/24/2008   Personal history of other diseases of digestive system 02/24/2008    PCP: Daisy Floro, MD REFERRING PROVIDER: Daisy Floro, MD  REFERRING DIAG:  Diagnosis  R42 (ICD-10-CM) - Dizziness and giddiness    THERAPY DIAG:  BPPV (benign paroxysmal positional vertigo), left  Unsteadiness on feet  ONSET DATE: 09-12-23  Rationale for Evaluation and Treatment: Rehabilitation  SUBJECTIVE:   SUBJECTIVE  STATEMENT: Pt reports she has not had any of the spinning vertigo since treatment last week; says now it is more imbalance and unsteadiness with turns. Pt reports she has not been doing the gaze stabilization exercise because she hears the crepitus in her neck when she does it - doesn't want to risk causing flare up of neck pain Pt accompanied by: self  PERTINENT HISTORY: h/o anterior fusion of cervical spine 2000, cervical spondylosis with radiculopathy, h/o PAC, PVC; recently diagnosed with OSA with pt reporting getting CPAP machine in near future: h/o BPPV/imbalance with treatment at this facility in Nov. 2022 - Feb. 2023  PAIN:  Are you having pain? No  PRECAUTIONS: None  RED FLAGS: None   WEIGHT BEARING RESTRICTIONS: No  FALLS: Has patient fallen in last 6 months? 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  DIAGNOSTIC FINDINGS: N/A  COGNITION: Overall cognitive status:  pt reports some mild impairments (memory) which may be related to oxygen deprivation due to OSA  POSTURE:  No Significant postural limitations  Cervical ROM:  limited in active extension due to s/p cervical fusion   STRENGTH: WNL's bil. UE's & LE's  LOWER EXTREMITY MMT: WNL's  BED MOBILITY:  Independent  TRANSFERS: Assistive device utilized: None  Sit to stand: Complete Independence Stand to sit: Complete Independence  GAIT: Gait pattern: WFL Distance walked: 37' Assistive device utilized: None Level of assistance: Complete Independence  PATIENT SURVEYS:  FOTO Not captured by front office at eval - will be completed next session  VESTIBULAR ASSESSMENT:  GENERAL OBSERVATION: pt is a 72 yr old lady amb. Independently  without device but has unsteadiness upon initial sit to stand and with initiating steps for ambulation   SYMPTOM BEHAVIOR:  Subjective history: Pt reports some imbalance started about end of August with episode of vertigo occurring on 09-12-23 when she was in [place] for a conference; states she looked down at her phone in the evening and suddenly had room spinning vertigo  Non-Vestibular symptoms: neck pain, headaches, nausea/vomiting, and cervical fusion  Type of dizziness: Imbalance (Disequilibrium), Spinning/Vertigo, and Unsteady with head/body turns  Frequency: daily;   Duration: mostly all day  Aggravating factors: Induced by position change: rolling to the left, Induced by motion: occur when walking, bending down to the ground, turning body quickly, and turning head quickly, and Occurs when standing still   Relieving factors: lying supine and rest  Progression of symptoms: unchanged  OCULOMOTOR EXAM:  Ocular Alignment: normal  Ocular ROM: No Limitations  Spontaneous Nystagmus: absent  Gaze-Induced Nystagmus: absent  Smooth Pursuits: intact  Saccades: intact and  reports Almost  blind with going down to see object below   FRENZEL - FIXATION SUPRESSED:  Ocular Alignment: normal  Spontaneous Nystagmus: absent  Gaze-Induced Nystagmus: absent  Positional tests: Right Dix-Hallpike: no nystagmus Left Dix-Hallpike: upbeating, left nystagmus Right Sidelying: no nystagmus Left Sidelying: no nystagmus   VESTIBULAR - OCULAR REFLEX: NT      POSITIONAL TESTING:    Right Dix-Hallpike: no nystagmus Left Dix-Hallpike: upbeating, left nystagmus Right Sidelying: no nystagmus but dizziness reported with return to upright Left Sidelying: no nystagmus but dizziness reported with return to upright  MOTION SENSITIVITY:  Motion Sensitivity Quotient Intensity: 0 = none, 1 = Lightheaded, 2 = Mild, 3 = Moderate, 4 = Severe, 5 = Vomiting  Intensity  1. Sitting to supine   2. Supine to L side   3.  Supine to R side   4. Supine to sitting   5. L Hallpike-Dix   6. Up from L  3  7. R Hallpike-Dix   8. Up from R  4  9. Sitting, head tipped to L knee   10. Head up from L knee   11. Sitting, head tipped to R knee   12. Head up from R knee   13. Sitting head turns x5   14.Sitting head nods x5   15. In stance, 180 turn to L    16. In stance, 180 turn to R    Up from Rt sidelying - mod. dizziness            Up from Lt sidelying - mod. dizziness  Smooth pursuits 1/10 horizontal - no nystagmus Vertical -  reports almost diplopia with tracking down     FUNCTIONAL GAIT:  TBA when BPPV resolved   VESTIBULAR TREATMENT:                                                                                                   DATE: 10-14-23  NeuroRe-ed: Lt Dix-Hallpike = (-) with no nystagmus and no c/o vertigo in test position - indicative of resolution of BPPV  mCTSIB; Condition 1 - 30 secs:  Condition 2 - 30 secs: Condition 3 - 30 secs:  Condition 4 - 30 secs with mild postural sway but c/o moderate dizziness/dysequilibrium when stepping off foam to floor  Instructed in balance on foam (4 positions) for HEP:  instructed pt to only do feet apart to start with and then gradually progress to feet together as feet apart gets easier  Gait:    10/14/23 0001  Functional Gait  Assessment  Gait assessed  Yes  Gait Level Surface 3 (5.25)  Change in Gait Speed 3  Gait with Horizontal Head Turns 2  Gait with Vertical Head Turns 3  Gait and Pivot Turn 3  Step Over Obstacle 3  Gait with Narrow Base of Support 3  Gait with Eyes Closed 2  Ambulating Backwards 2 (13.72)  Steps 3  Total Score 27     HEP 10-14-23:  Balance on foam (4 positions)   Gaze Stabilization: Tip Card  1.Target must remain in focus, not blurry,  and appear stationary while head is in motion. 2.Perform exercises with small head movements (45 to either side of midline). 3.Increase speed of head motion so long as  target is in focus. 4.If you wear eyeglasses, be sure you can see target through lens (therapist will give specific instructions for bifocal / progressive lenses). 5.These exercises may provoke dizziness or nausea. Work through these symptoms. If too dizzy, slow head movement slightly. Rest between each exercise. 6.Exercises demand concentration; avoid distractions. 7.For safety, perform standing exercises close to a counter, wall, corner, or next to someone.    Gaze Stabilization: Standing Feet Apart    Feet shoulder width apart, keeping eyes on target on wall __5__ feet away, tilt head down 15-30 and move head side to side for __60__ seconds. Repeat while moving head up and down for _60___ seconds. Do _3___ sessions per day. Repeat using target on pattern background.   PATIENT EDUCATION: Education details:  x1 viewing exercise;  Person educated: Patient Education method: Programmer, multimedia, Demonstration, and Handouts Education comprehension: verbalized understanding and needs further education  HOME EXERCISE PROGRAM: Brandt-Daroff exercise for Rt BPPV;  issued x1 viewing exercise on 10-07-23  GOALS: Goals reviewed with patient? Yes  SHORT TERM GOALS: same as LTG's as ELOS = 4 weeks   LONG TERM GOALS: Target date: 11-01-23   Pt will have a (-) Lt Dix-Hallpike test with no nystagmus and no c/o vertigo in test position to indicate resolution of Lt BPPV. Baseline: (+) Lt Dix-Hallpike test Goal status: INITIAL  2.  Improve FGA score by at least 4 points to demo improvement in balance. Baseline: score 27/30 on 10-14-23 Goal status: INITIAL  3.  Complete FOTO and set LTG as appropriate. Baseline:  Goal status: Deferred due to not captured on eval nor on 2nd visit  4.  Pt will perform all bed mobility without c/o vertigo. Baseline:  Goal status: INITIAL  5.  Independent in HEP for vestibular exercises including balance and habituation. Baseline:  Goal status:  INITIAL   ASSESSMENT:  CLINICAL IMPRESSION: Pt had (-) Lt Dix-Hallpike test with no nystagmus and no c/o vertigo in test position, indicative of resolution of Lt BPPV.  Pt's FGA score = 27/30 with pt having most unsteadiness with amb. With horizontal head turns and with eyes closed.  Remainder of session focused on HEP addition of standing on compliant surface with eyes open and closed for increased vestibular input in maintaining balance.  Cont with POC.    OBJECTIVE IMPAIRMENTS: decreased balance, difficulty walking, and dizziness.   ACTIVITY LIMITATIONS: bending, stairs, transfers, bed mobility, and locomotion level  PARTICIPATION LIMITATIONS: cleaning, laundry, driving, shopping, and community activity  PERSONAL FACTORS: Past/current experiences are also affecting patient's functional outcome.   REHAB POTENTIAL: Good  CLINICAL DECISION MAKING: Stable/uncomplicated  EVALUATION COMPLEXITY: Low   PLAN:  PT FREQUENCY: 2x/week  PT DURATION: 4 weeks  PLANNED INTERVENTIONS: Therapeutic exercises, Therapeutic activity, Neuromuscular re-education, Balance training, Gait training, Patient/Family education, Self Care, Vestibular training, and Canalith repositioning  PLAN FOR NEXT SESSION: SOT and compare with previous test: check balance on foam exercises:  update HEP as appropriate   Gerrit Rafalski, Donavan Burnet, PT 10/15/2023, 9:03 AM

## 2023-10-15 ENCOUNTER — Encounter: Payer: Self-pay | Admitting: Physical Therapy

## 2023-10-15 NOTE — Progress Notes (Signed)
   10/14/23 0001  Functional Gait  Assessment  Gait assessed  Yes  Gait Level Surface 3 (5.25)  Change in Gait Speed 3  Gait with Horizontal Head Turns 2  Gait with Vertical Head Turns 3  Gait and Pivot Turn 3  Step Over Obstacle 3  Gait with Narrow Base of Support 3  Gait with Eyes Closed 2  Ambulating Backwards 2 (13.72)  Steps 3  Total Score 27

## 2023-10-16 DIAGNOSIS — G4733 Obstructive sleep apnea (adult) (pediatric): Secondary | ICD-10-CM | POA: Diagnosis not present

## 2023-10-17 ENCOUNTER — Ambulatory Visit: Payer: Medicare PPO | Admitting: Physical Therapy

## 2023-10-17 DIAGNOSIS — R262 Difficulty in walking, not elsewhere classified: Secondary | ICD-10-CM | POA: Diagnosis not present

## 2023-10-17 DIAGNOSIS — R42 Dizziness and giddiness: Secondary | ICD-10-CM

## 2023-10-17 DIAGNOSIS — R2681 Unsteadiness on feet: Secondary | ICD-10-CM | POA: Diagnosis not present

## 2023-10-17 DIAGNOSIS — H8112 Benign paroxysmal vertigo, left ear: Secondary | ICD-10-CM | POA: Diagnosis not present

## 2023-10-17 NOTE — Therapy (Signed)
OUTPATIENT PHYSICAL THERAPY VESTIBULAR TREATMENT NOTE     Patient Name: SHIRELL PELZ MRN: [id number] DOB:July 05, 1951, 72 y.o., female Today's Date: 10/18/2023  END OF SESSION:  PT End of Session - 10/18/23 1527     Visit Number 4    Number of Visits 9    Date for PT Re-Evaluation 11/08/23   pushed out 1 week due to schedule availability   Authorization Type [employer]    Authorization Time Period 10-03-23 - 11-30-23    Authorization - Visit Number 4    Authorization - Number of Visits 9    PT Start Time 1018    PT Stop Time 1100    PT Time Calculation (min) 42 min    Activity Tolerance Patient tolerated treatment well    Behavior During Therapy [facility] for tasks assessed/performed               Past Medical History:  Diagnosis Date   Ankle fracture, left 2005   Arthritis    Cancer (HCC)    basal cell ca removed from arm   Complaints of memory disturbance    evaluated by Dr  [name]   Depression    Dysrhythmia    Has PACs   Esophageal stricture    Esophagitis    Family history of adverse reaction to anesthesia    MOTHER ALWAYS HAS N/V WITH SURGERY    GERD (gastroesophageal reflux disease)    History of environmental allergies    GETS HIVES OCCASIONALLY WHEN NOT USING JUICE PLUS PILLS    OSA (obstructive sleep apnea) 09/18/2023   PAC (premature atrial contraction) 09/18/2023   Pneumonia    years ago   Premature atrial contractions    followed by DR Donnie Aho    Past Surgical History:  Procedure Laterality Date   ABDOMINAL HYSTERECTOMY  1998   ANTERIOR CERVICAL DECOMP/DISCECTOMY FUSION  12/11/2011   Procedure: ANTERIOR CERVICAL DECOMPRESSION/DISCECTOMY FUSION 2 LEVELS;  Surgeon: Stefani Dama;  Location: [facility];  Service: Neurosurgery;  Laterality: N/A;  C5-6 C6-7 Anterior cervical decompression/diskectomy fusion   ANTERIOR FUSION CERVICAL SPINE  2000   COLONOSCOPY     every 5 years for FH colon polyps   CORNEAL TRANSPLANT     ESOPHAGEAL MANOMETRY N/A  05/25/2016   Procedure: ESOPHAGEAL MANOMETRY (EM);  Surgeon: Ruffin Frederick, MD;  Location: [facility];  Service: Gastroenterology;  Laterality: N/A;   EYE SURGERY     x 5 right eye   INCONTINENCE SURGERY     KNEE ARTHROSCOPY Right 2015   KNEE ARTHROSCOPY W/ LATERAL RELEASE Right    KNEE ARTHROSCOPY W/ MENISCAL REPAIR Left    KNEE CLOSED REDUCTION Right 02/10/2018   Procedure: CLOSED MANIPULATION RIGHT KNEE;  Surgeon: Ranee Gosselin, MD;  Location: [facility];  Service: Orthopedics;  Laterality: Right;   NISSEN FUNDOPLICATION  04/24/2017   SCAR DEBRIDEMENT OF TOTAL KNEE Right 06/11/2019   Procedure: Irrigation and debridement excision of scar right total knee arthroplasty, with  poly revision;  Surgeon: Durene Romans, MD;  Location: [facility];  Service: Orthopedics;  Laterality: Right;  90 mins   TOTAL KNEE ARTHROPLASTY Right 12/04/2017   Procedure: RIGHT TOTAL KNEE ARTHROPLASTY;  Surgeon: Ranee Gosselin, MD;  Location: [facility];  Service: Orthopedics;  Laterality: Right;   Patient Active Problem List   Diagnosis Date Noted   PAC (premature atrial contraction) 09/18/2023   PVC (premature ventricular contraction) 09/18/2023   OSA (obstructive sleep apnea) 09/18/2023   Arthrofibrosis of knee joint,  right 06/11/2019   Hx of total knee arthroplasty, right 12/04/2017   Atypical chest pain    Mild cognitive impairment with memory loss 04/29/2013   Cervical spondylosis with radiculopathy 12/11/2011   FECAL OCCULT BLOOD 02/18/2009   ESOPHAGEAL STRICTURE 02/16/2009   GERD 02/16/2009   MENOPAUSE, SURGICAL 02/24/2008   SKIN CANCER, HX OF 02/24/2008   Personal history of other diseases of digestive system 02/24/2008    PCP: Daisy Floro, MD REFERRING PROVIDER: Daisy Floro, MD  REFERRING DIAG:  Diagnosis  R42 (ICD-10-CM) - Dizziness and giddiness    THERAPY DIAG:  Unsteadiness on feet  Dizziness and giddiness  ONSET DATE: 09-12-23  Rationale for Evaluation and  Treatment: Rehabilitation  SUBJECTIVE:   SUBJECTIVE STATEMENT: Pt reports she has not had any of the spinning vertigo - "that is much better"; states she has noticed that her balance seems to be improved Pt accompanied by: self  PERTINENT HISTORY: h/o anterior fusion of cervical spine 2000, cervical spondylosis with radiculopathy, h/o PAC, PVC; recently diagnosed with OSA with pt reporting getting CPAP machine in near future: h/o BPPV/imbalance with treatment at this facility in Nov. 2022 - Feb. 2023  PAIN:  Are you having pain? No  PRECAUTIONS: None  RED FLAGS: None   WEIGHT BEARING RESTRICTIONS: No  FALLS: Has patient fallen in last 6 months? 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  DIAGNOSTIC FINDINGS: N/A  COGNITION: Overall cognitive status:  pt reports some mild impairments (memory) which may be related to oxygen deprivation due to OSA  POSTURE:  No Significant postural limitations  Cervical ROM:  limited in active extension due to s/p cervical fusion   STRENGTH: WNL's bil. UE's & LE's  LOWER EXTREMITY MMT: WNL's  BED MOBILITY:  Independent  TRANSFERS: Assistive device utilized: None  Sit to stand: Complete Independence Stand to sit: Complete Independence  GAIT: Gait pattern: WFL Distance walked: 80' Assistive device utilized: None Level of assistance: Complete Independence  PATIENT SURVEYS:  FOTO Not captured by front office at eval - will be completed next session  VESTIBULAR ASSESSMENT:  GENERAL OBSERVATION: pt is a 72 yr old lady amb. Independently without device but has unsteadiness upon initial sit to stand and with initiating steps for ambulation   SYMPTOM  BEHAVIOR:  Subjective history: Pt reports some imbalance started about end of August with episode of vertigo occurring on 09-12-23 when she was in [place] for a conference; states she looked down at her phone in the evening and suddenly had room spinning vertigo  Non-Vestibular symptoms: neck pain, headaches, nausea/vomiting, and cervical fusion  Type of dizziness: Imbalance (Disequilibrium), Spinning/Vertigo, and Unsteady with head/body turns  Frequency: daily;   Duration: mostly all day  Aggravating factors: Induced by position change: rolling to the left, Induced by motion: occur when walking, bending down to the ground, turning body quickly, and turning head quickly, and Occurs when standing still   Relieving factors: lying supine and rest  Progression of symptoms: unchanged  OCULOMOTOR EXAM:  Ocular Alignment: normal  Ocular ROM: No Limitations  Spontaneous Nystagmus: absent  Gaze-Induced Nystagmus: absent  Smooth Pursuits: intact  Saccades: intact and reports Almost  blind with going down to see object below   FRENZEL - FIXATION  SUPRESSED:  Ocular Alignment: normal  Spontaneous Nystagmus: absent  Gaze-Induced Nystagmus: absent  Positional tests: Right Dix-Hallpike: no nystagmus Left Dix-Hallpike: upbeating, left nystagmus Right Sidelying: no nystagmus Left Sidelying: no nystagmus   VESTIBULAR - OCULAR REFLEX: NT      POSITIONAL TESTING:    Right Dix-Hallpike: no nystagmus Left Dix-Hallpike: upbeating, left nystagmus Right Sidelying: no nystagmus but dizziness reported with return to upright Left Sidelying: no nystagmus but dizziness reported with return to upright  MOTION SENSITIVITY:  Motion Sensitivity Quotient Intensity: 0 = none, 1 = Lightheaded, 2 = Mild, 3 = Moderate, 4 = Severe, 5 = Vomiting  Intensity  1. Sitting to supine   2. Supine to L side   3. Supine to R side   4. Supine to sitting   5. L Hallpike-Dix   6. Up from L  3  7. R Hallpike-Dix   8. Up from  R  4  9. Sitting, head tipped to L knee   10. Head up from L knee   11. Sitting, head tipped to R knee   12. Head up from R knee   13. Sitting head turns x5   14.Sitting head nods x5   15. In stance, 180 turn to L    16. In stance, 180 turn to R    Up from Rt sidelying - mod. dizziness            Up from Lt sidelying - mod. dizziness  Smooth pursuits 1/10 horizontal - no nystagmus Vertical -  reports almost diplopia with tracking down     FUNCTIONAL GAIT:  TBA when BPPV resolved   VESTIBULAR TREATMENT:                                                                                                   DATE: 10-17-23  NeuroRe-ed: Sit to stand EC 3 reps feet on floor; 5 reps with EC with feet on Airex - no UE support used  Sensory Organization Test:  Composite score 70/100 (N=68/100) Somatosensory, visual WNL's:  Vestibular slightly decreased at 1 point below N  Condition 1 - trials 1 & 2 WNL's:  trial 3 slightly decreased Condition 2 - trials 1 & 2 WNL's; trial 3 slightly decreased Condition 3 - all 3 trials WNL's Condition 4 - trial 1 slightly decr.; trials 2 & 3 WNL's Condition 5 - trial 1 decreased; trials 2 & 3 WNL's Condition 6- all 3 trials WNL's  Pt did report not feeling well after exiting Neuro[facility] - due to motion sensitivity provoked with SOT   Compared today's test results with previous SOT results (Jan. & Feb. 2023) - today's score WNL's but composite score 70/100 decreased from 80/100 in Feb. 2023   Gaze Stabilization: Tip Card  1.Target must remain in focus, not blurry, and appear stationary while head is in motion. 2.Perform exercises with small head movements (45 to either side of midline). 3.Increase speed of head motion so long as target is in focus. 4.If you wear eyeglasses, be sure you can see target through lens (therapist will give specific  instructions for bifocal / progressive lenses). 5.These exercises may provoke dizziness or nausea. Work  through these symptoms. If too dizzy, slow head movement slightly. Rest between each exercise. 6.Exercises demand concentration; avoid distractions. 7.For safety, perform standing exercises close to a counter, wall, corner, or next to someone.    Gaze Stabilization: Standing Feet Apart    Feet shoulder width apart, keeping eyes on target on wall __5__ feet away, tilt head down 15-30 and move head side to side for __60__ seconds. Repeat while moving head up and down for _60___ seconds. Do _3___ sessions per day. Repeat using target on pattern background.   PATIENT EDUCATION: Education details:  x1 viewing exercise;  Person educated: Patient Education method: Programmer, multimedia, Demonstration, and Handouts Education comprehension: verbalized understanding and needs further education  HOME EXERCISE PROGRAM: Brandt-Daroff exercise for Rt BPPV;  issued x1 viewing exercise on 10-07-23  GOALS: Goals reviewed with patient? Yes  SHORT TERM GOALS: same as LTG's as ELOS = 4 weeks   LONG TERM GOALS: Target date: 11-01-23   Pt will have a (-) Lt Dix-Hallpike test with no nystagmus and no c/o vertigo in test position to indicate resolution of Lt BPPV. Baseline: (+) Lt Dix-Hallpike test Goal status: INITIAL  2.  Improve FGA score by at least 4 points to demo improvement in balance. Baseline: score 27/30 on 10-14-23 Goal status: INITIAL  3.  Complete FOTO and set LTG as appropriate. Baseline:  Goal status: Deferred due to not captured on eval nor on 2nd visit  4.  Pt will perform all bed mobility without c/o vertigo. Baseline:  Goal status: INITIAL  5.  Independent in HEP for vestibular exercises including balance and habituation. Baseline:  Goal status: INITIAL   ASSESSMENT:  CLINICAL IMPRESSION: PT session focused on SOT assessment with pt scoring WNL's for composite score (70/100 with N=68/100).  Somatosensory and visual inputs WNL's with vestibular input only slightly decreased 1  point from normal.  Pt reports no spinning vertigo, indicative of resolution of BPPV.  Pt also reports she has noticed that her balance seems to be improved.  Cont with POC.    OBJECTIVE IMPAIRMENTS: decreased balance, difficulty walking, and dizziness.   ACTIVITY LIMITATIONS: bending, stairs, transfers, bed mobility, and locomotion level  PARTICIPATION LIMITATIONS: cleaning, laundry, driving, shopping, and community activity  PERSONAL FACTORS: Past/current experiences are also affecting patient's functional outcome.   REHAB POTENTIAL: Good  CLINICAL DECISION MAKING: Stable/uncomplicated  EVALUATION COMPLEXITY: Low   PLAN:  PT FREQUENCY: 2x/week  PT DURATION: 4 weeks  PLANNED INTERVENTIONS: Therapeutic exercises, Therapeutic activity, Neuromuscular re-education, Balance training, Gait training, Patient/Family education, Self Care, Vestibular training, and Canalith repositioning  PLAN FOR NEXT SESSION: Check LTG's  - D/C next session? check balance on foam exercises:  update HEP as appropriate   Myrtis Maille, Donavan Burnet, PT 10/18/2023, 3:29 PM

## 2023-10-18 ENCOUNTER — Encounter: Payer: Self-pay | Admitting: Physical Therapy

## 2023-10-21 ENCOUNTER — Ambulatory Visit: Payer: Medicare PPO | Admitting: Physical Therapy

## 2023-10-21 DIAGNOSIS — R42 Dizziness and giddiness: Secondary | ICD-10-CM | POA: Diagnosis not present

## 2023-10-21 DIAGNOSIS — R2681 Unsteadiness on feet: Secondary | ICD-10-CM

## 2023-10-21 DIAGNOSIS — R262 Difficulty in walking, not elsewhere classified: Secondary | ICD-10-CM | POA: Diagnosis not present

## 2023-10-21 DIAGNOSIS — H8112 Benign paroxysmal vertigo, left ear: Secondary | ICD-10-CM | POA: Diagnosis not present

## 2023-10-21 NOTE — Therapy (Unsigned)
OUTPATIENT PHYSICAL THERAPY VESTIBULAR TREATMENT NOTE/DISCHARGE SUMMARY     Patient Name: [name] Stone MRN: [id number] DOB:1951/03/22, 72 y.o., female Today's Date: 10/22/2023  END OF SESSION:  PT End of Session - 10/22/23 1948     Visit Number 5    Number of Visits 9    Date for PT Re-Evaluation 11/08/23   pushed out 1 week due to schedule availability   Authorization Type [employer]    Authorization Time Period 10-03-23 - 11-30-23    Authorization - Number of Visits 9    PT Start Time 1105    PT Stop Time 1145    PT Time Calculation (min) 40 min    Activity Tolerance Patient tolerated treatment well    Behavior During Therapy [facility] for tasks assessed/performed                Past Medical History:  Diagnosis Date   Ankle fracture, left 2005   Arthritis    Cancer (HCC)    basal cell ca removed from arm   Complaints of memory disturbance    evaluated by Dr  [name]   Depression    Dysrhythmia    Has PACs   Esophageal stricture    Esophagitis    Family history of adverse reaction to anesthesia    MOTHER ALWAYS HAS N/V WITH SURGERY    GERD (gastroesophageal reflux disease)    History of environmental allergies    GETS HIVES OCCASIONALLY WHEN NOT USING JUICE PLUS PILLS    OSA (obstructive sleep apnea) 09/18/2023   PAC (premature atrial contraction) 09/18/2023   Pneumonia    years ago   Premature atrial contractions    followed by DR Donnie Aho    Past Surgical History:  Procedure Laterality Date   ABDOMINAL HYSTERECTOMY  1998   ANTERIOR CERVICAL DECOMP/DISCECTOMY FUSION  12/11/2011   Procedure: ANTERIOR CERVICAL DECOMPRESSION/DISCECTOMY FUSION 2 LEVELS;  Surgeon: Stefani Dama;  Location: [facility];  Service: Neurosurgery;  Laterality: N/A;  C5-6 C6-7 Anterior cervical decompression/diskectomy fusion   ANTERIOR FUSION CERVICAL SPINE  2000   COLONOSCOPY     every 5 years for FH colon polyps   CORNEAL TRANSPLANT     ESOPHAGEAL MANOMETRY N/A 05/25/2016    Procedure: ESOPHAGEAL MANOMETRY (EM);  Surgeon: Ruffin Frederick, MD;  Location: [facility];  Service: Gastroenterology;  Laterality: N/A;   EYE SURGERY     x 5 right eye   INCONTINENCE SURGERY     KNEE ARTHROSCOPY Right 2015   KNEE ARTHROSCOPY W/ LATERAL RELEASE Right    KNEE ARTHROSCOPY W/ MENISCAL REPAIR Left    KNEE CLOSED REDUCTION Right 02/10/2018   Procedure: CLOSED MANIPULATION RIGHT KNEE;  Surgeon: Ranee Gosselin, MD;  Location: [facility];  Service: Orthopedics;  Laterality: Right;   NISSEN FUNDOPLICATION  04/24/2017   SCAR DEBRIDEMENT OF TOTAL KNEE Right 06/11/2019   Procedure: Irrigation and debridement excision of scar right total knee arthroplasty, with  poly revision;  Surgeon: Durene Romans, MD;  Location: [facility];  Service: Orthopedics;  Laterality: Right;  90 mins   TOTAL KNEE ARTHROPLASTY Right 12/04/2017   Procedure: RIGHT TOTAL KNEE ARTHROPLASTY;  Surgeon: Ranee Gosselin, MD;  Location: [facility];  Service: Orthopedics;  Laterality: Right;   Patient Active Problem List   Diagnosis Date Noted   PAC (premature atrial contraction) 09/18/2023   PVC (premature ventricular contraction) 09/18/2023   OSA (obstructive sleep apnea) 09/18/2023   Arthrofibrosis of knee joint, right 06/11/2019   Hx of  total knee arthroplasty, right 12/04/2017   Atypical chest pain    Mild cognitive impairment with memory loss 04/29/2013   Cervical spondylosis with radiculopathy 12/11/2011   FECAL OCCULT BLOOD 02/18/2009   ESOPHAGEAL STRICTURE 02/16/2009   GERD 02/16/2009   MENOPAUSE, SURGICAL 02/24/2008   SKIN CANCER, HX OF 02/24/2008   Personal history of other diseases of digestive system 02/24/2008    PCP: Daisy Floro, MD REFERRING PROVIDER: Daisy Floro, MD  REFERRING DIAG:  Diagnosis  R42 (ICD-10-CM) - Dizziness and giddiness    THERAPY DIAG:  Unsteadiness on feet  Dizziness and giddiness  ONSET DATE: 09-12-23  Rationale for Evaluation and Treatment:  Rehabilitation  SUBJECTIVE:   SUBJECTIVE STATEMENT: Pt reports she is doing much better - balance is improved - went line dancing last Thursday night; feels ready for discharge today Pt accompanied by: self  PERTINENT HISTORY: h/o anterior fusion of cervical spine 2000, cervical spondylosis with radiculopathy, h/o PAC, PVC; recently diagnosed with OSA with pt reporting getting CPAP machine in near future: h/o BPPV/imbalance with treatment at this facility in Nov. 2022 - Feb. 2023  PAIN:  Are you having pain? No  PRECAUTIONS: None  RED FLAGS: None   WEIGHT BEARING RESTRICTIONS: No  FALLS: Has patient fallen in last 6 months? 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  DIAGNOSTIC FINDINGS: N/A  COGNITION: Overall cognitive status:  pt reports some mild impairments (memory) which may be related to oxygen deprivation due to OSA  POSTURE:  No Significant postural limitations  Cervical ROM:  limited in active extension due to s/p cervical fusion   STRENGTH: WNL's bil. UE's & LE's  LOWER EXTREMITY MMT: WNL's  BED MOBILITY:  Independent  TRANSFERS: Assistive device utilized: None  Sit to stand: Complete Independence Stand to sit: Complete Independence  GAIT: Gait pattern: WFL Distance walked: 35' Assistive device utilized: None Level of assistance: Complete Independence  PATIENT SURVEYS:  FOTO Not captured by front office at eval - will be completed next session  VESTIBULAR ASSESSMENT:  GENERAL OBSERVATION: pt is a 72 yr old lady amb. Independently without device but has unsteadiness upon initial sit to stand and with initiating steps for ambulation   SYMPTOM BEHAVIOR:  Subjective  history: Pt reports some imbalance started about end of August with episode of vertigo occurring on 09-12-23 when she was in [place] for a conference; states she looked down at her phone in the evening and suddenly had room spinning vertigo  Non-Vestibular symptoms: neck pain, headaches, nausea/vomiting, and cervical fusion  Type of dizziness: Imbalance (Disequilibrium), Spinning/Vertigo, and Unsteady with head/body turns  Frequency: daily;   Duration: mostly all day  Aggravating factors: Induced by position change: rolling to the left, Induced by motion: occur when walking, bending down to the ground, turning body quickly, and turning head quickly, and Occurs when standing still   Relieving factors: lying supine and rest  Progression of symptoms: unchanged  OCULOMOTOR EXAM:  Ocular Alignment: normal  Ocular ROM: No Limitations  Spontaneous Nystagmus: absent  Gaze-Induced Nystagmus: absent  Smooth Pursuits: intact  Saccades: intact and reports Almost  blind with going down to see object below   FRENZEL - FIXATION SUPRESSED:  Ocular Alignment: normal  Spontaneous Nystagmus: absent  Gaze-Induced Nystagmus: absent  Positional tests: Right Dix-Hallpike: no nystagmus Left Dix-Hallpike: upbeating, left nystagmus Right Sidelying: no nystagmus Left Sidelying: no nystagmus   VESTIBULAR - OCULAR REFLEX: NT      POSITIONAL TESTING:    Right Dix-Hallpike: no nystagmus Left Dix-Hallpike: upbeating, left nystagmus Right Sidelying: no nystagmus but dizziness reported with return to upright Left Sidelying: no nystagmus but dizziness reported with return to upright  MOTION SENSITIVITY:  Motion Sensitivity Quotient Intensity: 0 = none, 1 = Lightheaded, 2 = Mild, 3 = Moderate, 4 = Severe, 5 = Vomiting  Intensity  1. Sitting to supine   2. Supine to L side   3. Supine to R side   4. Supine to sitting   5. L Hallpike-Dix   6. Up from L  3  7. R Hallpike-Dix   8. Up from R  4  9. Sitting, head  tipped to L knee   10. Head up from L knee   11. Sitting, head tipped to R knee   12. Head up from R knee   13. Sitting head turns x5   14.Sitting head nods x5   15. In stance, 180 turn to L    16. In stance, 180 turn to R    Up from Rt sidelying - mod. dizziness            Up from Lt sidelying - mod. dizziness  Smooth pursuits 1/10 horizontal - no nystagmus Vertical -  reports almost diplopia with tracking down     FUNCTIONAL GAIT:  TBA when BPPV resolved   VESTIBULAR TREATMENT:                                                                                                   DATE: 10-21-23  NeuroRe-ed: Standing on airex in corner - feet together - 30 secs EO:  EC 30 secs - mild postural sway but no LOB  Gait: FGA   10/21/23 0001  Functional Gait  Assessment  Gait assessed  Yes  Gait Level Surface 3  Change in Gait Speed 3  Gait with Horizontal Head Turns 2  Gait with Vertical Head Turns 3  Gait and Pivot Turn 3  Step Over Obstacle 3  Gait with Narrow Base of Support 3  Gait with Eyes Closed 3  Ambulating Backwards 3  Steps 3  Total Score 29     Gaze Stabilization: Tip Card  1.Target must remain in focus, not blurry, and appear stationary while head is in motion. 2.Perform exercises with small head movements (45 to either side of midline). 3.Increase speed of head motion so long as target is in focus. 4.If you wear eyeglasses, be sure you can see target through lens (therapist will give specific instructions for bifocal / progressive lenses). 5.These exercises may provoke dizziness or nausea. Work through these symptoms. If too dizzy, slow head movement slightly. Rest between each exercise. 6.Exercises demand concentration; avoid distractions. 7.For safety, perform standing exercises close to a counter, wall, corner, or next to someone.    Gaze Stabilization: Standing Feet Apart  Feet shoulder width apart, keeping eyes on target on wall __5__ feet  away, tilt head down 15-30 and move head side to side for __60__ seconds. Repeat while moving head up and down for _60___ seconds. Do _3___ sessions per day. Repeat using target on pattern background.   PATIENT EDUCATION: Education details:  x1 viewing exercise;  Person educated: Patient Education method: Programmer, multimedia, Demonstration, and Handouts Education comprehension: verbalized understanding and needs further education  HOME EXERCISE PROGRAM: Brandt-Daroff exercise for Rt BPPV;  issued x1 viewing exercise on 10-07-23  GOALS: Goals reviewed with patient? Yes  SHORT TERM GOALS: same as LTG's as ELOS = 4 weeks   LONG TERM GOALS: Target date: 11-01-23   Pt will have a (-) Lt Dix-Hallpike test with no nystagmus and no c/o vertigo in test position to indicate resolution of Lt BPPV. Baseline: (+) Lt Dix-Hallpike test Goal status: Goal met 10-21-23  2.  Improve FGA score by at least 4 points to demo improvement in balance. Baseline: score 27/30 on 10-14-23 Goal status: Goal deferred - score improved from 27/30 to 29/30 on 10-21-23  3.  Complete FOTO and set LTG as appropriate. Baseline:  Goal status: Deferred due to not captured on eval nor on 2nd visit  4.  Pt will perform all bed mobility without c/o vertigo. Baseline:  Goal status: Goal met   5.  Independent in HEP for vestibular exercises including balance and habituation. Baseline:  Goal status: Goal met 10-21-23   ASSESSMENT:  CLINICAL IMPRESSION: Pt has met LTG's #1, 4 and 5:  FGA score has increased from 27/30 to 29/30;  LTG #3 deferred as FOTO was not completed at eval.  Lt BPPV has resolved and gait and balance are WNL's.  Pt is discharged due to all goals met.   OBJECTIVE IMPAIRMENTS: decreased balance, difficulty walking, and dizziness.   ACTIVITY LIMITATIONS: bending, stairs, transfers, bed mobility, and locomotion level  PARTICIPATION LIMITATIONS: cleaning, laundry, driving, shopping, and community  activity  PERSONAL FACTORS: Past/current experiences are also affecting patient's functional outcome.   REHAB POTENTIAL: Good  CLINICAL DECISION MAKING: Stable/uncomplicated  EVALUATION COMPLEXITY: Low   PLAN:  PT FREQUENCY: 2x/week  PT DURATION: 4 weeks  PLANNED INTERVENTIONS: Therapeutic exercises, Therapeutic activity, Neuromuscular re-education, Balance training, Gait training, Patient/Family education, Self Care, Vestibular training, and Canalith repositioning  PLAN FOR NEXT SESSION:  D/C on 10-21-23   PHYSICAL THERAPY DISCHARGE SUMMARY  Visits from Start of Care: 5  Current functional level related to goals / functional outcomes: See above for progress towards goals   Remaining deficits: Pt continues to have mild deviation in path with amb. With horizontal head turns but no LOB Continues to have motion sensitivity    Education / Equipment: Pt has been instructed in Epley maneuver for self treatment of BPPV prn (if she has a re-occurrence in the future)   Patient agrees to discharge. Patient goals were met. Patient is being discharged due to meeting the stated rehab goals.    Kary Kos, PT 10/22/2023, 7:56 PM

## 2023-10-21 NOTE — Patient Instructions (Signed)
How to Perform the Epley Maneuver The Epley maneuver is an exercise that relieves symptoms of vertigo. Vertigo is the feeling that you or your surroundings are moving when they are not. When you feel vertigo, you may feel like the room is spinning and may have trouble walking. The Epley maneuver is used for a type of vertigo caused by a calcium deposit in a part of the inner ear. The maneuver involves changing head positions to help the deposit move out of the area. You can do this maneuver at home whenever you have symptoms of vertigo. You can repeat it in 24 hours if your vertigo has not gone away. Even though the Epley maneuver may relieve your vertigo for a few weeks, it is possible that your symptoms will return. This maneuver relieves vertigo, but it does not relieve dizziness. What are the risks? If it is done correctly, the Epley maneuver is considered safe. Sometimes it can lead to dizziness or nausea that goes away after a short time. If you develop other symptoms--such as changes in vision, weakness, or numbness--stop doing the maneuver and call your health care provider. Supplies needed: A bed or table. A pillow. How to do the Epley maneuver     Sit on the edge of a bed or table with your back straight and your legs extended or hanging over the edge of the bed or table. Turn your head halfway toward the affected ear or side as told by your health care provider. Lie backward quickly with your head turned until you are lying flat on your back. Your head should dangle (head-hanging position). You may want to position a pillow under your shoulders. Hold this position for at least 30 seconds. If you feel dizzy or have symptoms of vertigo, continue to hold the position until the symptoms stop. Turn your head to the opposite direction until your unaffected ear is facing down. Your head should continue to dangle. Hold this position for at least 30 seconds. If you feel dizzy or have symptoms  of vertigo, continue to hold the position until the symptoms stop. Turn your whole body to the same side as your head so that you are positioned on your side. Your head will now be nearly facedown and no longer needs to dangle. Hold for at least 30 seconds. If you feel dizzy or have symptoms of vertigo, continue to hold the position until the symptoms stop. Sit back up. You can repeat the maneuver in 24 hours if your vertigo does not go away. Follow these instructions at home: For 24 hours after doing the Epley maneuver: Keep your head in an upright position. When lying down to sleep or rest, keep your head raised (elevated) with two or more pillows. Avoid excessive neck movements. Activity Do not drive or use machinery if you feel dizzy. After doing the Epley maneuver, return to your normal activities as told by your health care provider. Ask your health care provider what activities are safe for you. General instructions Drink enough fluid to keep your urine pale yellow. Do not drink alcohol. Take over-the-counter and prescription medicines only as told by your health care provider. Keep all follow-up visits. This is important. Preventing vertigo symptoms Ask your health care provider if there is anything you should do at home to prevent vertigo. He or she may recommend that you: Keep your head elevated with two or more pillows while you sleep. Do not sleep on the side of your affected ear. Get  up slowly from bed. Avoid sudden movements during the day. Avoid extreme head positions or movement, such as looking up or bending over. Contact a health care provider if: Your vertigo gets worse. You have other symptoms, including: Nausea. Vomiting. Headache. Get help right away if you: Have vision changes. Have a headache or neck pain that is severe or getting worse. Cannot stop vomiting. Have new numbness or weakness in any part of your body. These symptoms may represent a serious  problem that is an emergency. Do not wait to see if the symptoms will go away. Get medical help right away. Call your local emergency services (911 in the [place] Do not drive yourself to the hospital. Summary Vertigo is the feeling that you or your surroundings are moving when they are not. The Epley maneuver is an exercise that relieves symptoms of vertigo. If the Epley maneuver is done correctly, it is considered safe. This information is not intended to replace advice given to you by your health care provider. Make sure you discuss any questions you have with your health care provider. Document Revised: 11/16/2020 Document Reviewed: 11/16/2020 Elsevier Patient Education  2024 [employer]    Self Treatment for Left Posterior / Anterior Canalithiasis    Sitting on bed: 1. Turn head 45 left. (a) Lie back slowly, shoulders on pillow, head on bed. (b) Hold _20___ seconds. 2. Keeping head on bed, turn head 90 right. Hold _20___ seconds. 3. Roll to right, head on 45 angle down toward bed. Hold __20__ seconds. 4. Sit up on right side of bed. Repeat __3__ times per session. Do __2__ sessions per day.  Copyright  VHI. All rights reserved.

## 2023-10-22 ENCOUNTER — Encounter: Payer: Self-pay | Admitting: Physical Therapy

## 2023-10-22 NOTE — Progress Notes (Signed)
   10/21/23 0001  Functional Gait  Assessment  Gait assessed  Yes  Gait Level Surface 3  Change in Gait Speed 3  Gait with Horizontal Head Turns 2  Gait with Vertical Head Turns 3  Gait and Pivot Turn 3  Step Over Obstacle 3  Gait with Narrow Base of Support 3  Gait with Eyes Closed 3  Ambulating Backwards 3  Steps 3  Total Score 29

## 2023-10-24 ENCOUNTER — Encounter: Payer: Medicare PPO | Admitting: Physical Therapy

## 2023-10-28 ENCOUNTER — Encounter: Payer: Medicare PPO | Admitting: Physical Therapy

## 2023-10-29 DIAGNOSIS — Z6831 Body mass index (BMI) 31.0-31.9, adult: Secondary | ICD-10-CM | POA: Diagnosis not present

## 2023-10-29 DIAGNOSIS — E6609 Other obesity due to excess calories: Secondary | ICD-10-CM | POA: Diagnosis not present

## 2023-10-29 DIAGNOSIS — E66811 Obesity, class 1: Secondary | ICD-10-CM | POA: Diagnosis not present

## 2023-10-31 ENCOUNTER — Encounter: Payer: Medicare PPO | Admitting: Physical Therapy

## 2023-10-31 DIAGNOSIS — G4733 Obstructive sleep apnea (adult) (pediatric): Secondary | ICD-10-CM | POA: Diagnosis not present

## 2023-11-12 DIAGNOSIS — M51369 Other intervertebral disc degeneration, lumbar region without mention of lumbar back pain or lower extremity pain: Secondary | ICD-10-CM | POA: Diagnosis not present

## 2023-11-12 DIAGNOSIS — Z6831 Body mass index (BMI) 31.0-31.9, adult: Secondary | ICD-10-CM | POA: Diagnosis not present

## 2023-11-12 DIAGNOSIS — G4733 Obstructive sleep apnea (adult) (pediatric): Secondary | ICD-10-CM | POA: Diagnosis not present

## 2023-11-12 DIAGNOSIS — K219 Gastro-esophageal reflux disease without esophagitis: Secondary | ICD-10-CM | POA: Diagnosis not present

## 2023-11-12 DIAGNOSIS — E6609 Other obesity due to excess calories: Secondary | ICD-10-CM | POA: Diagnosis not present

## 2023-11-13 DIAGNOSIS — Z6831 Body mass index (BMI) 31.0-31.9, adult: Secondary | ICD-10-CM | POA: Diagnosis not present

## 2023-11-13 DIAGNOSIS — E6609 Other obesity due to excess calories: Secondary | ICD-10-CM | POA: Diagnosis not present

## 2023-11-13 DIAGNOSIS — E66811 Obesity, class 1: Secondary | ICD-10-CM | POA: Diagnosis not present

## 2023-11-25 ENCOUNTER — Ambulatory Visit (INDEPENDENT_AMBULATORY_CARE_PROVIDER_SITE_OTHER): Payer: Medicare PPO

## 2023-11-25 ENCOUNTER — Ambulatory Visit
Admission: RE | Admit: 2023-11-25 | Discharge: 2023-11-25 | Disposition: A | Discharge status: Home / Self Care | Payer: Medicare PPO | Source: Ambulatory Visit | Attending: Family Medicine | Admitting: Family Medicine

## 2023-11-25 VITALS — BP 143/81 | HR 77 | Temp 97.5°F | Resp 16

## 2023-11-25 DIAGNOSIS — J309 Allergic rhinitis, unspecified: Secondary | ICD-10-CM

## 2023-11-25 DIAGNOSIS — R059 Cough, unspecified: Secondary | ICD-10-CM

## 2023-11-25 DIAGNOSIS — J069 Acute upper respiratory infection, unspecified: Secondary | ICD-10-CM

## 2023-11-25 DIAGNOSIS — R509 Fever, unspecified: Secondary | ICD-10-CM | POA: Diagnosis not present

## 2023-11-25 MED ORDER — PREDNISONE 20 MG PO TABS: ORAL_TABLET | ORAL | 0 refills | Status: AC

## 2023-11-25 MED ORDER — DOXYCYCLINE HYCLATE 100 MG PO CAPS: 100.0000 mg | ORAL_CAPSULE | Freq: Two times a day (BID) | ORAL | 0 refills | Status: AC

## 2023-11-25 MED ORDER — FEXOFENADINE HCL 180 MG PO TABS: 180.0000 mg | ORAL_TABLET | Freq: Every day | ORAL | 0 refills | Status: AC

## 2023-11-25 MED ORDER — HYDROCOD POLI-CHLORPHE POLI ER 10-8 MG/5ML PO SUER: 5.0000 mL | Freq: Two times a day (BID) | ORAL | 0 refills | Status: AC | PRN

## 2023-11-25 MED ORDER — BENZONATATE 200 MG PO CAPS: 200.0000 mg | ORAL_CAPSULE | Freq: Three times a day (TID) | ORAL | 0 refills | Status: AC | PRN

## 2023-11-25 NOTE — Discharge Instructions (Addendum)
Advised patient to take medications as directed with food to completion.  Advised patient to take prednisone with first dose of doxycycline for the next 5 to 7 days.  Advised may use Tessalon capsules daily or as needed for cough advised patient to take Allegra daily for 5 days and then as needed for concurrent postnasal drainage/drip.  Advised may use Tussionex for cough at night prior to sleep due to sedative effects.  Encouraged to increase daily water intake to 64 ounces per day while taking these medications.  Advised if symptoms worsen and/or unresolved please follow-up with PCP or here for further evaluation.

## 2023-11-25 NOTE — ED Provider Notes (Signed)
[facility]    CSN: [id number] Arrival date & time: 11/25/23  1304      History   Chief Complaint Chief Complaint  Patient presents with   Cough    Persistent Cough, no fever since last Wednesday night (6 days). - Entered by patient    HPI [name] Stone is a 72 y.o. female.   HPI pleasant 72 year old female presents with persistent cough for 5 days.  PMH significant for obesity, BCC, dysrhythmia (PACs), and esophageal stricture.  Past Medical History:  Diagnosis Date   Ankle fracture, left 2005   Arthritis    Cancer (HCC)    basal cell ca removed from arm   Complaints of memory disturbance    evaluated by Dr  [name]   Depression    Dysrhythmia    Has PACs   Esophageal stricture    Esophagitis    Family history of adverse reaction to anesthesia    MOTHER ALWAYS HAS N/V WITH SURGERY    GERD (gastroesophageal reflux disease)    History of environmental allergies    GETS HIVES OCCASIONALLY WHEN NOT USING JUICE PLUS PILLS    OSA (obstructive sleep apnea) 09/18/2023   PAC (premature atrial contraction) 09/18/2023   Pneumonia    years ago   Premature atrial contractions    followed by DR Donnie Aho     Patient Active Problem List   Diagnosis Date Noted   PAC (premature atrial contraction) 09/18/2023   PVC (premature ventricular contraction) 09/18/2023   OSA (obstructive sleep apnea) 09/18/2023   Arthrofibrosis of knee joint, right 06/11/2019   Hx of total knee arthroplasty, right 12/04/2017   Atypical chest pain    Mild cognitive impairment with memory loss 04/29/2013   Cervical spondylosis with radiculopathy 12/11/2011   FECAL OCCULT BLOOD 02/18/2009   ESOPHAGEAL STRICTURE 02/16/2009   GERD 02/16/2009   MENOPAUSE, SURGICAL 02/24/2008   SKIN CANCER, HX OF 02/24/2008   Personal history of other diseases of digestive system 02/24/2008    Past Surgical History:  Procedure Laterality Date   ABDOMINAL HYSTERECTOMY  1998   ANTERIOR CERVICAL  DECOMP/DISCECTOMY FUSION  12/11/2011   Procedure: ANTERIOR CERVICAL DECOMPRESSION/DISCECTOMY FUSION 2 LEVELS;  Surgeon: Stefani Dama;  Location: [facility];  Service: Neurosurgery;  Laterality: N/A;  C5-6 C6-7 Anterior cervical decompression/diskectomy fusion   ANTERIOR FUSION CERVICAL SPINE  2000   COLONOSCOPY     every 5 years for FH colon polyps   CORNEAL TRANSPLANT     ESOPHAGEAL MANOMETRY N/A 05/25/2016   Procedure: ESOPHAGEAL MANOMETRY (EM);  Surgeon: Ruffin Frederick, MD;  Location: [facility];  Service: Gastroenterology;  Laterality: N/A;   EYE SURGERY     x 5 right eye   INCONTINENCE SURGERY     KNEE ARTHROSCOPY Right 2015   KNEE ARTHROSCOPY W/ LATERAL RELEASE Right    KNEE ARTHROSCOPY W/ MENISCAL REPAIR Left    KNEE CLOSED REDUCTION Right 02/10/2018   Procedure: CLOSED MANIPULATION RIGHT KNEE;  Surgeon: Ranee Gosselin, MD;  Location: [facility];  Service: Orthopedics;  Laterality: Right;   NISSEN FUNDOPLICATION  04/24/2017   SCAR DEBRIDEMENT OF TOTAL KNEE Right 06/11/2019   Procedure: Irrigation and debridement excision of scar right total knee arthroplasty, with  poly revision;  Surgeon: Durene Romans, MD;  Location: [facility];  Service: Orthopedics;  Laterality: Right;  90 mins   TOTAL KNEE ARTHROPLASTY Right 12/04/2017   Procedure: RIGHT TOTAL KNEE ARTHROPLASTY;  Surgeon: Ranee Gosselin, MD;  Location: [facility];  Service: Orthopedics;  Laterality: Right;    OB History   No obstetric history on file.      Home Medications    Prior to Admission medications   Medication Sig Start Date End Date Taking? Authorizing Provider  benzonatate (TESSALON) 200 MG capsule Take 1 capsule (200 mg total) by mouth 3 (three) times daily as needed for up to 7 days. 11/25/23 12/02/23 Yes Trevor Iha, FNP  chlorpheniramine-HYDROcodone (TUSSIONEX) 10-8 MG/5ML Take 5 mLs by mouth every 12 (twelve) hours as needed for cough. 11/25/23  Yes Trevor Iha, FNP  doxycycline (VIBRAMYCIN) 100 MG  capsule Take 1 capsule (100 mg total) by mouth 2 (two) times daily for 7 days. 11/25/23 12/02/23 Yes Trevor Iha, FNP  fexofenadine [facility] ALLERGY) 180 MG tablet Take 1 tablet (180 mg total) by mouth daily for 15 days. 11/25/23 12/10/23 Yes Trevor Iha, FNP  predniSONE (DELTASONE) 20 MG tablet Take 3 tabs PO daily x 5 days. 11/25/23  Yes Trevor Iha, FNP  betamethasone valerate (VALISONE) 0.1 % cream APPLY A THIN LAYER TO THE AFFECTED AREA(S) BY TOPICAL ROUTE ONCE DAILY    [provider]  Cholecalciferol 25 MCG (1000 UT) tablet Take 1 tablet by mouth daily.    [provider]  Cyanocobalamin (VITAMIN B12) 1000 MCG TBCR Take 1 tablet by mouth daily.    [provider]  estradiol (CLIMARA - DOSED IN MG/24 HR) 0.0375 mg/24hr patch Place 1 patch onto the skin 2 (two) times a week.     [provider]  LOTEMAX 0.5 % ophthalmic suspension Place 1 drop into the right eye every Wednesday. 10/09/17   [provider]  meclizine (ANTIVERT) 25 MG tablet Take 1 tablet by mouth daily as needed for dizziness. 03/02/20   [provider]  metoprolol succinate (TOPROL-XL) 25 MG 24 hr tablet TAKE 1 TABLET(25 MG) BY MOUTH DAILY 09/18/23   Chilton Si, MD  prednisoLONE acetate (PRED FORTE) 1 % ophthalmic suspension Place 1 drop into both eyes once a week.    [provider]  sodium chloride (MURO 128) 5 % ophthalmic solution Place 1 drop into both eyes as needed for eye irritation.    [provider]    Family History Family History  Problem Relation Age of Onset   Skin cancer Father    Bladder Cancer Father    Skin cancer Brother    Skin cancer Paternal Grandfather    Colon polyps Brother    Colitis Mother    Colon cancer Neg Hx     Social History Social History   Tobacco Use   Smoking status: Never   Smokeless tobacco: Never  Vaping Use   Vaping status: Never Used  Substance Use Topics   Alcohol use: No   Drug use:  No     Allergies   Aspirin and Demerol [meperidine hcl]   Review of Systems Review of Systems  Respiratory:  Positive for cough.   All other systems reviewed and are negative.    Physical Exam Triage Vital Signs ED Triage Vitals  Encounter Vitals Group     BP 11/25/23 1328 (!) 143/81     Systolic BP Percentile --      Diastolic BP Percentile --      Pulse Rate 11/25/23 1328 77     Resp 11/25/23 1328 16     Temp 11/25/23 1328 (!) 97.5 F (36.4 C)     Temp src --      SpO2 11/25/23 1328  98 %     Weight --      Height --      Head Circumference --      Peak Flow --      Pain Score 11/25/23 1327 0     Pain Loc --      Pain Education --      Exclude from Growth Chart --    No data found.  Updated Vital Signs BP (!) 143/81   Pulse 77   Temp (!) 97.5 F (36.4 C)   Resp 16   SpO2 98%    Physical Exam Vitals and nursing note reviewed.  Constitutional:      Appearance: Normal appearance. She is obese.  HENT:     Head: Normocephalic and atraumatic.     Right Ear: Tympanic membrane, ear canal and external ear normal.     Left Ear: Tympanic membrane, ear canal and external ear normal.     Mouth/Throat:     Mouth: Mucous membranes are moist.     Pharynx: Oropharynx is clear.     Comments: Significant amount of clear drainage of posterior oropharynx noted Eyes:     Extraocular Movements: Extraocular movements intact.     Conjunctiva/sclera: Conjunctivae normal.     Pupils: Pupils are equal, round, and reactive to light.  Cardiovascular:     Rate and Rhythm: Normal rate and regular rhythm.     Pulses: Normal pulses.     Heart sounds: Normal heart sounds. No murmur heard. Pulmonary:     Effort: Pulmonary effort is normal.     Breath sounds: No wheezing, rhonchi or rales.     Comments: Diminished breath sounds noted throughout, frequent nonproductive cough on exam Musculoskeletal:        General: Normal range of motion.     Cervical back: Normal range of motion  and neck supple.  Skin:    General: Skin is warm and dry.  Neurological:     General: No focal deficit present.     Mental Status: She is alert and oriented to person, place, and time. Mental status is at baseline.  Psychiatric:        Mood and Affect: Mood normal.        Behavior: Behavior normal.        Thought Content: Thought content normal.      UC Treatments / Results  Labs (all labs ordered are listed, but only abnormal results are displayed) Labs Reviewed - No data to display  EKG   Radiology DG Chest 2 View  Result Date: 11/25/2023 CLINICAL DATA:  Cough, fever. EXAM: CHEST - 2 VIEW COMPARISON:  December 13, 2022. FINDINGS: The heart size and mediastinal contours are within normal limits. Both lungs are clear. The visualized skeletal structures are unremarkable. IMPRESSION: No active cardiopulmonary disease. Electronically Signed   By: Lupita Raider M.D.   On: 11/25/2023 14:50    Procedures Procedures (including critical care time)  Medications Ordered in UC Medications - No data to display  Initial Impression / Assessment and Plan / UC Course  I have reviewed the triage vital signs and the nursing notes.  Pertinent labs & imaging results that were available during my care of the patient were reviewed by me and considered in my medical decision making (see chart for details).     MDM: 1.  Cough, unspecified type-CXR results revealed above, Rx'd Tessalon 200 mg capsules: Take 1 capsule 3 times daily, as needed for cough, Tussionex  10-8 mg / 5 mL syrup: Take 5 mL every 12 hours as needed for cough, prednisone 20 mg tablet: Take 3 tabs p.o. daily x 5 days.; 2. Acute URI-Rx'd doxycycline 100 mg capsule: Take 1 capsule twice daily x 7 days 2.  Allergic rhinitis, unspecified seasonality, unspecified trigger-Rx Allegra 180 mg fexofenadine daily x 5 days, then as needed for postnasal drainage/drip. Advised patient to take medications as directed with food to completion.   Advised patient to take prednisone with first dose of doxycycline for the next 5 to 7 days.  Advised may use Tessalon capsules daily or as needed for cough advised patient to take Allegra daily for 5 days and then as needed for concurrent postnasal drainage/drip.  Advised may use Tussionex for cough at night prior to sleep due to sedative effects.  Encouraged to increase daily water intake to 64 ounces per day while taking these medications.  Advised if symptoms worsen and/or unresolved please follow-up with PCP or here for further evaluation.  Patient discharged home, hemodynamically stable. Final Clinical Impressions(s) / UC Diagnoses   Final diagnoses:  Cough, unspecified type  Allergic rhinitis, unspecified seasonality, unspecified trigger  Acute upper respiratory infection     Discharge Instructions      Advised patient to take medications as directed with food to completion.  Advised patient to take prednisone with first dose of doxycycline for the next 5 to 7 days.  Advised may use Tessalon capsules daily or as needed for cough advised patient to take Allegra daily for 5 days and then as needed for concurrent postnasal drainage/drip.  Advised may use Tussionex for cough at night prior to sleep due to sedative effects.  Encouraged to increase daily water intake to 64 ounces per day while taking these medications.  Advised if symptoms worsen and/or unresolved please follow-up with PCP or here for further evaluation.     ED Prescriptions     Medication Sig Dispense Auth. Provider   fexofenadine (ALLEGRA ALLERGY) 180 MG tablet Take 1 tablet (180 mg total) by mouth daily for 15 days. 15 tablet Trevor Iha, FNP   doxycycline (VIBRAMYCIN) 100 MG capsule Take 1 capsule (100 mg total) by mouth 2 (two) times daily for 7 days. 14 capsule Trevor Iha, FNP   predniSONE (DELTASONE) 20 MG tablet Take 3 tabs PO daily x 5 days. 15 tablet Trevor Iha, FNP   benzonatate (TESSALON) 200 MG capsule  Take 1 capsule (200 mg total) by mouth 3 (three) times daily as needed for up to 7 days. 40 capsule Trevor Iha, FNP   chlorpheniramine-HYDROcodone (TUSSIONEX) 10-8 MG/5ML Take 5 mLs by mouth every 12 (twelve) hours as needed for cough. 115 mL Trevor Iha, FNP      I have reviewed the PDMP during this encounter.   Trevor Iha, FNP 11/25/23 [phone]

## 2023-11-25 NOTE — ED Triage Notes (Signed)
Pt presents to [facility] with co of cough and fevers since last Wednesday. Pt reports fevers have resolved but cough continues. Pt has been taking musinex otc.

## 2023-11-30 DIAGNOSIS — G4733 Obstructive sleep apnea (adult) (pediatric): Secondary | ICD-10-CM | POA: Diagnosis not present

## 2023-12-03 DIAGNOSIS — M51369 Other intervertebral disc degeneration, lumbar region without mention of lumbar back pain or lower extremity pain: Secondary | ICD-10-CM | POA: Diagnosis not present

## 2023-12-03 DIAGNOSIS — K219 Gastro-esophageal reflux disease without esophagitis: Secondary | ICD-10-CM | POA: Diagnosis not present

## 2023-12-03 DIAGNOSIS — G4733 Obstructive sleep apnea (adult) (pediatric): Secondary | ICD-10-CM | POA: Diagnosis not present

## 2023-12-03 DIAGNOSIS — Z6831 Body mass index (BMI) 31.0-31.9, adult: Secondary | ICD-10-CM | POA: Diagnosis not present

## 2023-12-04 DIAGNOSIS — E6609 Other obesity due to excess calories: Secondary | ICD-10-CM | POA: Diagnosis not present

## 2023-12-04 DIAGNOSIS — E66811 Obesity, class 1: Secondary | ICD-10-CM | POA: Diagnosis not present

## 2023-12-04 DIAGNOSIS — Z6831 Body mass index (BMI) 31.0-31.9, adult: Secondary | ICD-10-CM | POA: Diagnosis not present

## 2023-12-11 DIAGNOSIS — Z6830 Body mass index (BMI) 30.0-30.9, adult: Secondary | ICD-10-CM | POA: Diagnosis not present

## 2023-12-11 DIAGNOSIS — E7800 Pure hypercholesterolemia, unspecified: Secondary | ICD-10-CM | POA: Diagnosis not present

## 2023-12-11 DIAGNOSIS — E66811 Obesity, class 1: Secondary | ICD-10-CM | POA: Diagnosis not present

## 2023-12-31 DIAGNOSIS — G4733 Obstructive sleep apnea (adult) (pediatric): Secondary | ICD-10-CM | POA: Diagnosis not present

## 2024-01-07 DIAGNOSIS — L918 Other hypertrophic disorders of the skin: Secondary | ICD-10-CM | POA: Diagnosis not present

## 2024-01-07 DIAGNOSIS — L821 Other seborrheic keratosis: Secondary | ICD-10-CM | POA: Diagnosis not present

## 2024-01-07 DIAGNOSIS — L578 Other skin changes due to chronic exposure to nonionizing radiation: Secondary | ICD-10-CM | POA: Diagnosis not present

## 2024-01-07 DIAGNOSIS — Z85828 Personal history of other malignant neoplasm of skin: Secondary | ICD-10-CM | POA: Diagnosis not present

## 2024-01-07 DIAGNOSIS — L814 Other melanin hyperpigmentation: Secondary | ICD-10-CM | POA: Diagnosis not present

## 2024-01-16 DIAGNOSIS — E6609 Other obesity due to excess calories: Secondary | ICD-10-CM | POA: Diagnosis not present

## 2024-01-16 DIAGNOSIS — Z6831 Body mass index (BMI) 31.0-31.9, adult: Secondary | ICD-10-CM | POA: Diagnosis not present

## 2024-01-16 DIAGNOSIS — E66811 Obesity, class 1: Secondary | ICD-10-CM | POA: Diagnosis not present

## 2024-01-31 DIAGNOSIS — G4733 Obstructive sleep apnea (adult) (pediatric): Secondary | ICD-10-CM | POA: Diagnosis not present

## 2024-07-23 ENCOUNTER — Ambulatory Visit

## 2024-09-11 ENCOUNTER — Encounter (HOSPITAL_BASED_OUTPATIENT_CLINIC_OR_DEPARTMENT_OTHER): Payer: Self-pay | Admitting: Cardiovascular Disease

## 2024-10-13 ENCOUNTER — Other Ambulatory Visit (HOSPITAL_BASED_OUTPATIENT_CLINIC_OR_DEPARTMENT_OTHER): Payer: Self-pay | Admitting: Cardiovascular Disease

## 2024-11-18 ENCOUNTER — Other Ambulatory Visit: Payer: Self-pay | Admitting: Cardiovascular Disease

## 2024-11-19 ENCOUNTER — Other Ambulatory Visit: Payer: Self-pay | Admitting: Cardiovascular Disease

## 2024-11-20 MED ORDER — METOPROLOL SUCCINATE ER 25 MG PO TB24: ORAL_TABLET | ORAL | 0 refills | Status: DC

## 2024-11-28 ENCOUNTER — Other Ambulatory Visit: Payer: Self-pay | Admitting: Cardiovascular Disease

## 2024-12-01 ENCOUNTER — Other Ambulatory Visit: Payer: Self-pay | Admitting: Cardiovascular Disease

## 2024-12-01 ENCOUNTER — Telehealth: Payer: Self-pay | Admitting: Cardiovascular Disease

## 2024-12-01 NOTE — Telephone Encounter (Signed)
*  STAT* If patient is at the pharmacy, call can be transferred to refill team.   1. Which medications need to be refilled? (please list name of each medication and dose if known) metoprolol  succinate (TOPROL -XL) 25 MG 24 hr tablet    2. Would you like to learn more about the convenience, safety, & potential cost savings by using the [facility]? No    3. Are you open to using the [facility] (Type [facility]. No    4. Which pharmacy/location (including street and city if local pharmacy) is medication to be sent to? [id number] - [place], [place] - [address] AT [address]     5. Do they need a 30 day or 90 day supply? 90 day    Pt has appt scheduled 02/26/25 Pt out of medication

## 2024-12-02 MED ORDER — METOPROLOL SUCCINATE ER 25 MG PO TB24: ORAL_TABLET | ORAL | 0 refills | Status: AC

## 2024-12-02 NOTE — Telephone Encounter (Signed)
 Pt scheduled to see Dr. Raford 02/26/25.  Refill sent.

## 2025-02-26 ENCOUNTER — Ambulatory Visit (HOSPITAL_BASED_OUTPATIENT_CLINIC_OR_DEPARTMENT_OTHER): Admitting: Cardiovascular Disease
